# Patient Record
Sex: Female | Born: 1935
Health system: Southern US, Community
[De-identification: ages and names within clinical notes are randomized; demographics above are authoritative.]

## PROBLEM LIST (undated history)

## (undated) DIAGNOSIS — E785 Hyperlipidemia, unspecified: Secondary | ICD-10-CM

## (undated) DIAGNOSIS — I2510 Atherosclerotic heart disease of native coronary artery without angina pectoris: Secondary | ICD-10-CM

## (undated) DIAGNOSIS — M1990 Unspecified osteoarthritis, unspecified site: Secondary | ICD-10-CM

## (undated) DIAGNOSIS — I10 Essential (primary) hypertension: Secondary | ICD-10-CM

## (undated) DIAGNOSIS — I4891 Unspecified atrial fibrillation: Secondary | ICD-10-CM

## (undated) DIAGNOSIS — R011 Cardiac murmur, unspecified: Secondary | ICD-10-CM

## (undated) DIAGNOSIS — M810 Age-related osteoporosis without current pathological fracture: Secondary | ICD-10-CM

## (undated) DIAGNOSIS — H3530 Unspecified macular degeneration: Secondary | ICD-10-CM

## (undated) DIAGNOSIS — I639 Cerebral infarction, unspecified: Secondary | ICD-10-CM

## (undated) HISTORY — DX: Unspecified osteoarthritis, unspecified site: M19.90

## (undated) HISTORY — DX: Age-related osteoporosis without current pathological fracture: M81.0

## (undated) HISTORY — DX: Essential (primary) hypertension: I10

## (undated) HISTORY — DX: Hyperlipidemia, unspecified: E78.5

## (undated) HISTORY — DX: Unspecified macular degeneration: H35.30

## (undated) HISTORY — DX: Cardiac murmur, unspecified: R01.1

## (undated) HISTORY — PX: ABDOMINAL HYSTERECTOMY: SHX81

## (undated) HISTORY — PX: LEEP: SHX91

## (undated) HISTORY — PX: KNEE ARTHROSCOPY: SUR90

## (undated) HISTORY — PX: BREAST BIOPSY: SHX20

---

## 1998-11-20 ENCOUNTER — Encounter: Payer: Self-pay | Admitting: Gastroenterology

## 1998-11-20 ENCOUNTER — Ambulatory Visit (HOSPITAL_COMMUNITY): Admission: RE | Admit: 1998-11-20 | Discharge: 1998-11-20 | Payer: Self-pay | Admitting: Gastroenterology

## 1999-08-21 ENCOUNTER — Encounter: Payer: Self-pay | Admitting: Family Medicine

## 1999-08-22 ENCOUNTER — Encounter: Payer: Self-pay | Admitting: Family Medicine

## 1999-08-27 ENCOUNTER — Encounter: Payer: Self-pay | Admitting: Family Medicine

## 2003-07-25 LAB — CONVERTED CEMR LAB: Pap Smear: NORMAL

## 2004-06-10 ENCOUNTER — Ambulatory Visit: Payer: Self-pay | Admitting: Family Medicine

## 2004-07-16 ENCOUNTER — Ambulatory Visit: Payer: Self-pay | Admitting: Gastroenterology

## 2004-08-28 ENCOUNTER — Ambulatory Visit: Payer: Self-pay | Admitting: *Deleted

## 2004-09-23 ENCOUNTER — Ambulatory Visit: Payer: Self-pay

## 2005-05-12 ENCOUNTER — Ambulatory Visit: Payer: Self-pay | Admitting: *Deleted

## 2005-12-30 ENCOUNTER — Ambulatory Visit: Payer: Self-pay | Admitting: *Deleted

## 2009-11-13 ENCOUNTER — Encounter: Payer: Self-pay | Admitting: Family Medicine

## 2010-03-19 ENCOUNTER — Encounter: Payer: Self-pay | Admitting: Family Medicine

## 2010-07-30 ENCOUNTER — Other Ambulatory Visit: Payer: Self-pay | Admitting: Family Medicine

## 2010-07-30 ENCOUNTER — Ambulatory Visit
Admission: RE | Admit: 2010-07-30 | Discharge: 2010-07-30 | Payer: Self-pay | Source: Home / Self Care | Attending: Family Medicine | Admitting: Family Medicine

## 2010-07-30 ENCOUNTER — Encounter: Payer: Self-pay | Admitting: Family Medicine

## 2010-07-30 DIAGNOSIS — M949 Disorder of cartilage, unspecified: Secondary | ICD-10-CM

## 2010-07-30 DIAGNOSIS — M25559 Pain in unspecified hip: Secondary | ICD-10-CM | POA: Insufficient documentation

## 2010-07-30 DIAGNOSIS — I10 Essential (primary) hypertension: Secondary | ICD-10-CM | POA: Insufficient documentation

## 2010-07-30 DIAGNOSIS — M899 Disorder of bone, unspecified: Secondary | ICD-10-CM | POA: Insufficient documentation

## 2010-07-30 DIAGNOSIS — M1389 Other specified arthritis, multiple sites: Secondary | ICD-10-CM | POA: Insufficient documentation

## 2010-07-30 DIAGNOSIS — E785 Hyperlipidemia, unspecified: Secondary | ICD-10-CM | POA: Insufficient documentation

## 2010-07-30 LAB — HEPATIC FUNCTION PANEL
ALT: 21 U/L (ref 0–35)
AST: 26 U/L (ref 0–37)
Albumin: 3.8 g/dL (ref 3.5–5.2)
Alkaline Phosphatase: 66 U/L (ref 39–117)
Bilirubin, Direct: 0.2 mg/dL (ref 0.0–0.3)
Total Bilirubin: 1 mg/dL (ref 0.3–1.2)
Total Protein: 6.7 g/dL (ref 6.0–8.3)

## 2010-07-30 LAB — BASIC METABOLIC PANEL
BUN: 30 mg/dL — ABNORMAL HIGH (ref 6–23)
CO2: 26 mEq/L (ref 19–32)
Calcium: 9.7 mg/dL (ref 8.4–10.5)
Chloride: 107 mEq/L (ref 96–112)
Creatinine, Ser: 1.4 mg/dL — ABNORMAL HIGH (ref 0.4–1.2)
GFR: 39.8 mL/min — ABNORMAL LOW (ref >60.00)
Glucose, Bld: 98 mg/dL (ref 70–99)
Potassium: 4.1 mEq/L (ref 3.5–5.1)
Sodium: 142 mEq/L (ref 135–145)

## 2010-07-31 ENCOUNTER — Telehealth (INDEPENDENT_AMBULATORY_CARE_PROVIDER_SITE_OTHER): Payer: Self-pay | Admitting: *Deleted

## 2010-08-01 ENCOUNTER — Encounter: Payer: Self-pay | Admitting: Family Medicine

## 2010-08-04 ENCOUNTER — Other Ambulatory Visit: Payer: Self-pay | Admitting: Family Medicine

## 2010-08-04 ENCOUNTER — Ambulatory Visit
Admission: RE | Admit: 2010-08-04 | Discharge: 2010-08-04 | Payer: Self-pay | Source: Home / Self Care | Attending: Family Medicine | Admitting: Family Medicine

## 2010-08-04 LAB — BASIC METABOLIC PANEL
BUN: 25 mg/dL — ABNORMAL HIGH (ref 6–23)
CO2: 29 mEq/L (ref 19–32)
Calcium: 9.9 mg/dL (ref 8.4–10.5)
Chloride: 104 mEq/L (ref 96–112)
Creatinine, Ser: 1.1 mg/dL (ref 0.4–1.2)
GFR: 49.2 mL/min — ABNORMAL LOW (ref >60.00)
Glucose, Bld: 98 mg/dL (ref 70–99)
Potassium: 4.1 mEq/L (ref 3.5–5.1)
Sodium: 141 mEq/L (ref 135–145)

## 2010-08-04 LAB — HEPATIC FUNCTION PANEL
ALT: 19 U/L (ref 0–35)
AST: 26 U/L (ref 0–37)
Albumin: 4 g/dL (ref 3.5–5.2)
Alkaline Phosphatase: 64 U/L (ref 39–117)
Bilirubin, Direct: 0.1 mg/dL (ref 0.0–0.3)
Total Bilirubin: 1.1 mg/dL (ref 0.3–1.2)
Total Protein: 7 g/dL (ref 6.0–8.3)

## 2010-08-04 LAB — LIPID PANEL
Cholesterol: 183 mg/dL (ref 0–200)
HDL: 65.4 mg/dL (ref >39.00)
LDL Cholesterol: 100 mg/dL — ABNORMAL HIGH (ref 0–99)
Total CHOL/HDL Ratio: 3
Triglycerides: 88 mg/dL (ref 0.0–149.0)
VLDL: 17.6 mg/dL (ref 0.0–40.0)

## 2010-08-06 ENCOUNTER — Telehealth: Payer: Self-pay | Admitting: Family Medicine

## 2010-08-14 NOTE — Letter (Signed)
Summary: Letter to Patient Regarding Lab Results/[facility]  Letter to Patient Regarding Lab Results/[facility]   Imported By: Lanelle Bal 08/08/2010 09:13:31  _____________________________________________________________________  External Attachment:    Type:   Image     Comment:   External Document

## 2010-08-14 NOTE — Progress Notes (Signed)
Summary: Results 1/19, 1/20  Phone Note Outgoing Call   Call placed by: Almeta Monas CMA Duncan Dull),  July 31, 2010 4:54 PM Details for Reason: kidney function slightly elevated-----may be dehydrated----increase water intake recheck 2 weeks   401.9  bmp Summary of Call: spoke with patient and I advised of the above and she stated she drank alot of water, stated she basically only drink water. She said she doesn't drink alcohol or take tylenol, only a baby asa daily. Would you like for patient to have her labs repeated sooner??? Initial call taken by: Almeta Monas CMA Duncan Dull),  July 31, 2010 4:57 PM  Follow-up for Phone Call        yes-- next week is fine Follow-up by: Loreen Freud DO,  July 31, 2010 5:01 PM  Additional Follow-up for Phone Call Additional follow up Details #1::        Left message to call back... Almeta Monas CMA Duncan Dull)  August 01, 2010 10:20 AM     Additional Follow-up for Phone Call Additional follow up Details #2::    she was in today--- make sure regina did a hep too. Follow-up by: Loreen Freud DO,  August 04, 2010 10:20 AM  Additional Follow-up for Phone Call Additional follow up Details #3:: Details for Additional Follow-up Action Taken: I made [facility] aware to add a hep.... Almeta Monas CMA Duncan Dull)  August 04, 2010 10:52 AM

## 2010-08-14 NOTE — Assessment & Plan Note (Signed)
Summary: NEW TO EST/KN   Vital Signs:  Patient profile:   77 year old female Height:      63 inches Weight:      192.4 pounds BMI:     34.21 Pulse rate:   80 / minute Pulse rhythm:   regular BP sitting:   130 / 76  (right arm) Cuff size:   large  Vitals Entered By: Almeta Monas CMA Duncan Dull) (July 30, 2010 10:09 AM) CC: New est care-- c/o arthritis and needs bone density   History of Present Illness: Pt is here to establish.  Pt sees cardiologist ---in [facility].  He takes care of her cholesterol and bp med. Pt complains of b/l hip pain that has progressively getting worse.   The mobic 7.5 is not working like it used to.     Preventive Screening-Counseling & Management  Alcohol-Tobacco     Alcohol drinks/day: <1     Smoking Status: never  Caffeine-Diet-Exercise     Caffeine use/day: 0     Does Patient Exercise: no      Drug Use:  no.    Current Medications (verified): 1)  Hydrochlorothiazide 25 Mg Tabs (Hydrochlorothiazide) .Marland Kitchen.. 1 By Mouth Once Daily 2)  Pravachol 40 Mg Tabs (Pravastatin Sodium) .Marland Kitchen.. 1 By Mouth At Bedtime 3)  Meloxicam 15 Mg Tabs (Meloxicam) .Marland Kitchen.. 1 By Mouth Once Daily 4)  Sotalol Hcl 80 Mg Tabs (Sotalol Hcl) .Marland Kitchen.. 1 By Mouth Two Times A Day 5)  Calcium 600 1500 Mg Tabs (Calcium Carbonate) .Marland Kitchen.. 1 By Mouth Two Times A Day 6)  Senior Multivitamin Plus  Tabs (Multiple Vitamins-Minerals) .Marland Kitchen.. 1 By Mouth Once Daily  Allergies (verified): No Known Drug Allergies  Past History:  Family History: Last updated: 07/30/2010 Family History of Arthritis Family History of Cardiovascular disorder M --parkinsons ,  Rheumatoid arth Family History Diabetes 1st degree relative  Social History: Last updated: 07/30/2010 Retired-- Chief Operating Officer  Divorced Never Smoked Alcohol use-yes Drug use-no Regular exercise-no  Risk Factors: Alcohol Use: <1 (07/30/2010) Caffeine Use: 0 (07/30/2010) Exercise: no (07/30/2010)  Risk Factors: Smoking Status: never  (07/30/2010)  Past Medical History: Hyperlipidemia Hypertension Arthritis  Past Surgical History: Hysterectomy Breast Biopsy  Family History: Reviewed history and no changes required. Family History of Arthritis Family History of Cardiovascular disorder M --parkinsons ,  Rheumatoid arth Family History Diabetes 1st degree relative  Social History: Reviewed history and no changes required. Retired-- Chief Operating Officer  Divorced Never Smoked Alcohol use-yes Drug use-no Regular exercise-no Smoking Status:  never Caffeine use/day:  0 Does Patient Exercise:  no Drug Use:  no  Review of Systems      See HPI  Physical Exam  General:  Well-developed,well-nourished,in no acute distress; alert,appropriate and cooperative throughout examination Neck:  No deformities, masses, or tenderness noted. Lungs:  Normal respiratory effort, chest expands symmetrically. Lungs are clear to auscultation, no crackles or wheezes. Heart:  normal rate and no murmur.   Msk:  normal ROM, no joint swelling, no joint warmth, and no redness over joints.   + b/l hip pain with working  Extremities:  No clubbing, cyanosis, edema, or deformity noted with normal full range of motion of all joints.   Neurologic:  gait normal and DTRs symmetrical and normal.   Psych:  Oriented X3 and normally interactive.     Impression & Recommendations:  Problem # 1:  HIP PAIN, BILATERAL (ICD-719.45)  Her updated medication list for this problem includes:    Meloxicam 15 Mg Tabs (  Meloxicam) .Marland Kitchen... 1 by mouth once daily  Discussed use of medications, application of heat or cold, and exercises.   Orders: Prescription Created Electronically [phone])  Problem # 2:  OSTEOPENIA (ICD-733.90)  Her updated medication list for this problem includes:    Calcium 600 1500 Mg Tabs (Calcium carbonate) .Marland Kitchen... 1 by mouth two times a day  Orders: Radiology Referral (Radiology)  Problem # 3:  HYPERLIPIDEMIA (ICD-272.4)  Her  updated medication list for this problem includes:    Pravachol 40 Mg Tabs (Pravastatin sodium) .Marland Kitchen... 1 by mouth at bedtime  Orders: Venipuncture (60454) TLB-BMP (Basic Metabolic Panel-BMET) (80048-METABOL) TLB-Hepatic/Liver Function Pnl (80076-HEPATIC) Specimen Handling (09811)  Problem # 4:  HYPERTENSION (ICD-401.9)  Her updated medication list for this problem includes:    Hydrochlorothiazide 25 Mg Tabs (Hydrochlorothiazide) .Marland Kitchen... 1 by mouth once daily    Sotalol Hcl 80 Mg Tabs (Sotalol hcl) .Marland Kitchen... 1 by mouth two times a day  Orders: Venipuncture (91478) TLB-BMP (Basic Metabolic Panel-BMET) (80048-METABOL) TLB-Hepatic/Liver Function Pnl (80076-HEPATIC) Specimen Handling (29562)  BP today: 130/76  Complete Medication List: 1)  Hydrochlorothiazide 25 Mg Tabs (Hydrochlorothiazide) .Marland Kitchen.. 1 by mouth once daily 2)  Pravachol 40 Mg Tabs (Pravastatin sodium) .Marland Kitchen.. 1 by mouth at bedtime 3)  Meloxicam 15 Mg Tabs (Meloxicam) .Marland Kitchen.. 1 by mouth once daily 4)  Sotalol Hcl 80 Mg Tabs (Sotalol hcl) .Marland Kitchen.. 1 by mouth two times a day 5)  Calcium 600 1500 Mg Tabs (Calcium carbonate) .Marland Kitchen.. 1 by mouth two times a day 6)  Senior Multivitamin Plus Tabs (Multiple vitamins-minerals) .Marland Kitchen.. 1 by mouth once daily  Other Orders: Pneumococcal Vaccine (13086) Admin 1st Vaccine (57846) Prescriptions: SOTALOL HCL 80 MG TABS (SOTALOL HCL) 1 by mouth two times a day  #60 x 0   Entered and Authorized by:   Loreen Freud DO   Signed by:   Floydene Flock on 07/30/2010   Method used:   Historical   RxID:   [id number] MELOXICAM 15 MG TABS (MELOXICAM) 1 by mouth once daily  #90 x 3   Entered and Authorized by:   Loreen Freud DO   Signed by:   Loreen Freud DO on 07/30/2010   Method used:   Electronically to        [facility] [address] (retail)       [phone]       [place], [place]  [address]       Ph: [phone]       Fax: [phone]   RxID:    [id number]    Orders Added: 1)  Radiology Referral [Radiology] 2)  Venipuncture [09323] 3)  TLB-BMP (Basic Metabolic Panel-BMET) [80048-METABOL] 4)  TLB-Hepatic/Liver Function Pnl [80076-HEPATIC] 5)  Specimen Handling [99000] 6)  Pneumococcal Vaccine [90732] 7)  Admin 1st Vaccine [90471] 8)  New Patient Level III [55732] 9)  Prescription Created Electronically [phone]   Immunizations Administered:  Pneumonia Vaccine:    Vaccine Type: Pneumovax (Medicare)    Site: left deltoid    Mfr: Merck    Dose: 0.5 ml    Route: IM    Given by: Almeta Monas CMA (AAMA)    Exp. Date: 11/12/2011    Lot #: [id number]    VIS given: 06/17/09 version given July 30, 2010.   Immunizations Administered:  Pneumonia Vaccine:    Vaccine Type: Pneumovax (Medicare)    Site: left deltoid    Mfr: Merck    Dose: 0.5 ml  Route: IM    Given by: Almeta Monas CMA (AAMA)    Exp. Date: 11/12/2011    Lot #: [id number]    VIS given: 06/17/09 version given July 30, 2010.  Flu Vaccine Next Due:  Refused Colonoscopy Next Due:  Refused PAP Result Date:  07/25/2003 PAP Result:  normal-- vaginal smear PAP Next Due:  Not Indicated Mammogram Next Due:  Refused

## 2010-08-14 NOTE — Progress Notes (Signed)
Summary: Results 1/25  Phone Note Outgoing Call   Call placed by: Almeta Monas CMA Duncan Dull),  August 06, 2010 2:49 PM Call placed to: Patient Details for Reason: BuN elevated--- pt needs to make sure she is drinking enough fluids recheck 2 weeks--------401.9  bmp rest of labs 6 months---lipid, hep 272.4 Summary of Call: spoke with patient and she stated she does not like these results, thinks it may be caused by the HCTZ. She really wants to get down to the bottom of this wants  to know what to do...Marland KitchenMarland Kitchen please advise   Follow-up for Phone Call        Yes it could be but it also can be dehydration--- that is why I want to make sure she is drinking enough water specifically.  If she is then we can cut HCTZ in half--- but we would need to see her in 2 weeks to recheck labs and check bp Follow-up by: Loreen Freud DO,  August 06, 2010 3:04 PM  Additional Follow-up for Phone Call Additional follow up Details #1::        pt aware of the above and has been advised to monitor her BP and swelling and call if any problems, patient agreed. Additional Follow-up by: Almeta Monas CMA Duncan Dull),  August 07, 2010 4:20 PM

## 2010-08-14 NOTE — Letter (Signed)
Summary: Letter to Patient Regarding Results of Elbow Xray/[facility]  Letter to Patient Regarding Results of Elbow Xray/[facility]   Imported By: Lanelle Bal 08/08/2010 09:12:40  _____________________________________________________________________  External Attachment:    Type:   Image     Comment:   External Document

## 2010-08-18 ENCOUNTER — Encounter: Payer: Self-pay | Admitting: Family Medicine

## 2010-08-18 ENCOUNTER — Other Ambulatory Visit: Payer: Self-pay | Admitting: Family Medicine

## 2010-08-18 ENCOUNTER — Ambulatory Visit (INDEPENDENT_AMBULATORY_CARE_PROVIDER_SITE_OTHER): Payer: Medicare Other | Admitting: Family Medicine

## 2010-08-18 DIAGNOSIS — M899 Disorder of bone, unspecified: Secondary | ICD-10-CM

## 2010-08-18 DIAGNOSIS — M949 Disorder of cartilage, unspecified: Secondary | ICD-10-CM

## 2010-08-18 DIAGNOSIS — E785 Hyperlipidemia, unspecified: Secondary | ICD-10-CM

## 2010-08-18 DIAGNOSIS — M25559 Pain in unspecified hip: Secondary | ICD-10-CM

## 2010-08-18 DIAGNOSIS — I10 Essential (primary) hypertension: Secondary | ICD-10-CM

## 2010-08-18 LAB — CBC WITH DIFFERENTIAL/PLATELET
Basophils Absolute: 0 10*3/uL (ref 0.0–0.1)
Basophils Relative: 0.6 % (ref 0.0–3.0)
Eosinophils Absolute: 0.5 10*3/uL (ref 0.0–0.7)
Eosinophils Relative: 7.1 % — ABNORMAL HIGH (ref 0.0–5.0)
HCT: 39.8 % (ref 36.0–46.0)
Hemoglobin: 13.7 g/dL (ref 12.0–15.0)
Lymphocytes Relative: 35.1 % (ref 12.0–46.0)
Lymphs Abs: 2.2 10*3/uL (ref 0.7–4.0)
MCHC: 34.3 g/dL (ref 30.0–36.0)
MCV: 94.9 fl (ref 78.0–100.0)
Monocytes Absolute: 0.5 10*3/uL (ref 0.1–1.0)
Monocytes Relative: 8 % (ref 3.0–12.0)
Neutro Abs: 3.1 10*3/uL (ref 1.4–7.7)
Neutrophils Relative %: 49.2 % (ref 43.0–77.0)
Platelets: 181 10*3/uL (ref 150.0–400.0)
RBC: 4.2 Mil/uL (ref 3.87–5.11)
RDW: 13.9 % (ref 11.5–14.6)
WBC: 6.4 10*3/uL (ref 4.5–10.5)

## 2010-08-18 LAB — LIPID PANEL
Cholesterol: 176 mg/dL (ref 0–200)
HDL: 61.7 mg/dL (ref >39.00)
LDL Cholesterol: 97 mg/dL (ref 0–99)
Total CHOL/HDL Ratio: 3
Triglycerides: 86 mg/dL (ref 0.0–149.0)
VLDL: 17.2 mg/dL (ref 0.0–40.0)

## 2010-08-18 LAB — HEPATIC FUNCTION PANEL
ALT: 24 U/L (ref 0–35)
AST: 25 U/L (ref 0–37)
Albumin: 3.9 g/dL (ref 3.5–5.2)
Alkaline Phosphatase: 64 U/L (ref 39–117)
Bilirubin, Direct: 0.1 mg/dL (ref 0.0–0.3)
Total Bilirubin: 0.6 mg/dL (ref 0.3–1.2)
Total Protein: 6.8 g/dL (ref 6.0–8.3)

## 2010-08-18 LAB — BASIC METABOLIC PANEL
BUN: 31 mg/dL — ABNORMAL HIGH (ref 6–23)
CO2: 29 mEq/L (ref 19–32)
Calcium: 9.6 mg/dL (ref 8.4–10.5)
Chloride: 107 mEq/L (ref 96–112)
Creatinine, Ser: 1.3 mg/dL — ABNORMAL HIGH (ref 0.4–1.2)
GFR: 43.04 mL/min — ABNORMAL LOW (ref >60.00)
Glucose, Bld: 111 mg/dL — ABNORMAL HIGH (ref 70–99)
Potassium: 3.9 mEq/L (ref 3.5–5.1)
Sodium: 144 mEq/L (ref 135–145)

## 2010-08-20 LAB — CONVERTED CEMR LAB: Vit D, 25-Hydroxy: 41 ng/mL (ref 30–89)

## 2010-08-20 NOTE — Letter (Signed)
Summary: [facility]   Imported By: Lanelle Bal 08/11/2010 11:08:44  _____________________________________________________________________  External Attachment:    Type:   Image     Comment:   External Document

## 2010-08-20 NOTE — Letter (Signed)
Summary: [facility]   Imported By: Lanelle Bal 08/11/2010 11:07:47  _____________________________________________________________________  External Attachment:    Type:   Image     Comment:   External Document

## 2010-08-20 NOTE — Progress Notes (Signed)
Summary: Med List Brought by Patient  Med List Brought by Patient   Imported By: Lanelle Bal 08/12/2010 13:59:47  _____________________________________________________________________  External Attachment:    Type:   Image     Comment:   External Document

## 2010-08-28 NOTE — Assessment & Plan Note (Signed)
Summary: followup per pt/lab/kn  Nurse Visit   Vital Signs:  Patient profile:   77 year old female Weight:      192 pounds Pulse rate:   80 / minute Pulse rhythm:   regular BP sitting:   120 / 76  (left arm) Cuff size:   large  Vitals Entered By: Almeta Monas CMA Duncan Dull) (August 18, 2010 9:11 AM) CC: follow up---Fasting   Problems Prior to Update: 1)  Hip Pain, Bilateral  (ICD-719.45) 2)  Allergic Arthritis Other Specified Sites  (ICD-716.28) 3)  Family History Diabetes 1st Degree Relative  (ICD-V18.0) 4)  Osteopenia  (ICD-733.90) 5)  Hypertension  (ICD-401.9) 6)  Hyperlipidemia  (ICD-272.4)  Medications Prior to Update: 1)  Hydrochlorothiazide 25 Mg Tabs (Hydrochlorothiazide) .Marland Kitchen.. 1 By Mouth Once Daily 2)  Pravachol 40 Mg Tabs (Pravastatin Sodium) .Marland Kitchen.. 1 By Mouth At Bedtime 3)  Meloxicam 15 Mg Tabs (Meloxicam) .Marland Kitchen.. 1 By Mouth Once Daily 4)  Sotalol Hcl 80 Mg Tabs (Sotalol Hcl) .Marland Kitchen.. 1 By Mouth Two Times A Day 5)  Calcium 600 1500 Mg Tabs (Calcium Carbonate) .Marland Kitchen.. 1 By Mouth Two Times A Day 6)  Senior Multivitamin Plus  Tabs (Multiple Vitamins-Minerals) .Marland Kitchen.. 1 By Mouth Once Daily 7)  Calcium 600-200 Mg-Unit Tabs (Calcium-Vitamin D) .... 2 By Mouth Once Daily 8)  Aspirin 81 Mg Tbec (Aspirin) .... By Mouth Once Daily 9)  Omega 3 -100 Mg .... 2 By Mouth Once Daily 10)  Mvi .... By Mouth Once Daily  Current Medications (verified): 1)  Hydrochlorothiazide 25 Mg Tabs (Hydrochlorothiazide) .Marland Kitchen.. 1 By Mouth Once Daily 2)  Pravachol 40 Mg Tabs (Pravastatin Sodium) .Marland Kitchen.. 1 By Mouth At Bedtime 3)  Meloxicam 15 Mg Tabs (Meloxicam) .Marland Kitchen.. 1 By Mouth Once Daily 4)  Sotalol Hcl 80 Mg Tabs (Sotalol Hcl) .Marland Kitchen.. 1 By Mouth Two Times A Day 5)  Calcium 600 1500 Mg Tabs (Calcium Carbonate) .Marland Kitchen.. 1 By Mouth Two Times A Day 6)  Senior Multivitamin Plus  Tabs (Multiple Vitamins-Minerals) .Marland Kitchen.. 1 By Mouth Once Daily 7)  Calcium 600-200 Mg-Unit Tabs (Calcium-Vitamin D) .... 2 By Mouth Once Daily 8)   Aspirin 81 Mg Tbec (Aspirin) .... By Mouth Once Daily 9)  Omega 3 -100 Mg .... 2 By Mouth Once Daily 10)  Mvi .... By Mouth Once Daily  Allergies (verified): No Known Drug Allergies  Impression & Recommendations:  Problem # 1:  HIP PAIN, BILATERAL (ICD-719.45)  Her updated medication list for this problem includes:    Meloxicam 15 Mg Tabs (Meloxicam) .Marland Kitchen... 1 by mouth once daily    Aspirin 81 Mg Tbec (Aspirin) ..... By mouth once daily  Orders: Orthopedic Surgeon Referral (Ortho Surgeon)  Discussed use of medications, application of heat or cold, and exercises.   Problem # 2:  HYPERTENSION (ICD-401.9)  Her updated medication list for this problem includes:    Hydrochlorothiazide 25 Mg Tabs (Hydrochlorothiazide) .Marland Kitchen... 1 by mouth once daily    Sotalol Hcl 80 Mg Tabs (Sotalol hcl) .Marland Kitchen... 1 by mouth two times a day  Orders: Venipuncture (69629) TLB-Lipid Panel (80061-LIPID) TLB-BMP (Basic Metabolic Panel-BMET) (80048-METABOL) TLB-CBC Platelet - w/Differential (85025-CBCD) TLB-Hepatic/Liver Function Pnl (80076-HEPATIC) T-Vitamin D (25-Hydroxy) (52841-32440) T- * Misc. Laboratory test [phone]) Venipuncture [phone]) TLB-BMP (Basic Metabolic Panel-BMET) (80048-METABOL)  Complete Medication List: 1)  Hydrochlorothiazide 25 Mg Tabs (Hydrochlorothiazide) .Marland Kitchen.. 1 by mouth once daily 2)  Pravachol 40 Mg Tabs (Pravastatin sodium) .Marland Kitchen.. 1 by mouth at bedtime 3)  Meloxicam 15 Mg Tabs (Meloxicam) .Marland KitchenMarland KitchenMarland Kitchen  1 by mouth once daily 4)  Sotalol Hcl 80 Mg Tabs (Sotalol hcl) .Marland Kitchen.. 1 by mouth two times a day 5)  Calcium 600 1500 Mg Tabs (Calcium carbonate) .Marland Kitchen.. 1 by mouth two times a day 6)  Senior Multivitamin Plus Tabs (Multiple vitamins-minerals) .Marland Kitchen.. 1 by mouth once daily 7)  Calcium 600-200 Mg-unit Tabs (Calcium-vitamin d) .... 2 by mouth once daily 8)  Aspirin 81 Mg Tbec (Aspirin) .... By mouth once daily 9)  Omega 3 -100 Mg  .... 2 by mouth once daily 10)  Mvi  .... By mouth once daily   History  of Present Illness: Pt here for f/u bp and repeat bun/cr.  Pt states she is drinking a lot of fluids so she does not understand why her bun / cr are elevated..  Pt also requesting shingles vaccine but doesn't think she ever had chicken pox.  Pt also c/o increased hip pain--- no new injury.  Pain has been going on for years.  Mobic used to help.   Orders Added: 1)  Venipuncture [36415] 2)  TLB-Lipid Panel [80061-LIPID] 3)  TLB-BMP (Basic Metabolic Panel-BMET) [80048-METABOL] 4)  TLB-CBC Platelet - w/Differential [85025-CBCD] 5)  TLB-Hepatic/Liver Function Pnl [80076-HEPATIC] 6)  T-Vitamin D (25-Hydroxy) [16109-60454] 7)  T- * Misc. Laboratory test [99999] 8)  Venipuncture [36415] 9)  TLB-BMP (Basic Metabolic Panel-BMET) [80048-METABOL] 10)  Orthopedic Surgeon Referral [Ortho Surgeon] 11)  Est. Patient Level III [99213]  Physical Exam  General:  Well-developed,well-nourished,in no acute distress; alert,appropriate and cooperative throughout examination Neck:  No deformities, masses, or tenderness noted. Lungs:  Normal respiratory effort, chest expands symmetrically. Lungs are clear to auscultation, no crackles or wheezes. Heart:  normal rate and Grade 2  /6 systolic ejection murmur.   Extremities:  No clubbing, cyanosis, edema, or deformity noted with normal full range of motion of all joints.   Psych:  Oriented X3 and normally interactive.

## 2010-09-01 ENCOUNTER — Other Ambulatory Visit: Payer: Self-pay | Admitting: Family Medicine

## 2010-09-01 ENCOUNTER — Encounter: Payer: Self-pay | Admitting: Family Medicine

## 2010-09-01 ENCOUNTER — Ambulatory Visit (INDEPENDENT_AMBULATORY_CARE_PROVIDER_SITE_OTHER): Payer: Medicare Other | Admitting: Family Medicine

## 2010-09-01 DIAGNOSIS — I10 Essential (primary) hypertension: Secondary | ICD-10-CM

## 2010-09-01 DIAGNOSIS — R944 Abnormal results of kidney function studies: Secondary | ICD-10-CM

## 2010-09-01 LAB — BASIC METABOLIC PANEL
BUN: 30 mg/dL — ABNORMAL HIGH (ref 6–23)
CO2: 28 mEq/L (ref 19–32)
Calcium: 9.3 mg/dL (ref 8.4–10.5)
Chloride: 109 mEq/L (ref 96–112)
Creatinine, Ser: 1.4 mg/dL — ABNORMAL HIGH (ref 0.4–1.2)
GFR: 40.47 mL/min — ABNORMAL LOW (ref >60.00)
Glucose, Bld: 107 mg/dL — ABNORMAL HIGH (ref 70–99)
Potassium: 4.7 mEq/L (ref 3.5–5.1)
Sodium: 142 mEq/L (ref 135–145)

## 2010-09-04 ENCOUNTER — Ambulatory Visit (INDEPENDENT_AMBULATORY_CARE_PROVIDER_SITE_OTHER): Payer: Medicare Other | Admitting: Family Medicine

## 2010-09-04 ENCOUNTER — Encounter: Payer: Self-pay | Admitting: Family Medicine

## 2010-09-04 DIAGNOSIS — M1990 Unspecified osteoarthritis, unspecified site: Secondary | ICD-10-CM | POA: Insufficient documentation

## 2010-09-09 NOTE — Assessment & Plan Note (Signed)
Summary: 2 WEEK RETURN/RH.....   Vital Signs:  Patient profile:   77 year old female Weight:      194.4 pounds Pulse rate:   80 / minute Pulse rhythm:   regular BP sitting:   126 / 80  (right arm) Cuff size:   large  Vitals Entered By: Almeta Monas CMA (AAMA) (September 01, 2010 9:00 AM) CC: F/u visit--pt ws taken off of HCTZ   History of Present Illness: Pt here to recheck bp off hctz.  No swelling.  No complaints.  Problems Prior to Update: 1)  Hip Pain, Bilateral  (ICD-719.45) 2)  Allergic Arthritis Other Specified Sites  (ICD-716.28) 3)  Family History Diabetes 1st Degree Relative  (ICD-V18.0) 4)  Osteopenia  (ICD-733.90) 5)  Hypertension  (ICD-401.9) 6)  Hyperlipidemia  (ICD-272.4)  Medications Prior to Update: 1)  Hydrochlorothiazide 25 Mg Tabs (Hydrochlorothiazide) .Marland Kitchen.. 1 By Mouth Once Daily 2)  Pravachol 40 Mg Tabs (Pravastatin Sodium) .Marland Kitchen.. 1 By Mouth At Bedtime 3)  Meloxicam 15 Mg Tabs (Meloxicam) .Marland Kitchen.. 1 By Mouth Once Daily 4)  Sotalol Hcl 80 Mg Tabs (Sotalol Hcl) .Marland Kitchen.. 1 By Mouth Two Times A Day 5)  Calcium 600 1500 Mg Tabs (Calcium Carbonate) .Marland Kitchen.. 1 By Mouth Two Times A Day 6)  Senior Multivitamin Plus  Tabs (Multiple Vitamins-Minerals) .Marland Kitchen.. 1 By Mouth Once Daily 7)  Calcium 600-200 Mg-Unit Tabs (Calcium-Vitamin D) .... 2 By Mouth Once Daily 8)  Aspirin 81 Mg Tbec (Aspirin) .... By Mouth Once Daily 9)  Omega 3 -100 Mg .... 2 By Mouth Once Daily 10)  Mvi .... By Mouth Once Daily 11)  Zostavax 16109 Unt/0.28ml Solr (Zoster Vaccine Live) .... Inject Sub-Q  Current Medications (verified): 1)  Pravachol 40 Mg Tabs (Pravastatin Sodium) .Marland Kitchen.. 1 By Mouth At Bedtime 2)  Meloxicam 15 Mg Tabs (Meloxicam) .Marland Kitchen.. 1 By Mouth Once Daily 3)  Sotalol Hcl 80 Mg Tabs (Sotalol Hcl) .Marland Kitchen.. 1 By Mouth Two Times A Day 4)  Calcium 600 1500 Mg Tabs (Calcium Carbonate) .Marland Kitchen.. 1 By Mouth Two Times A Day 5)  Senior Multivitamin Plus  Tabs (Multiple Vitamins-Minerals) .Marland Kitchen.. 1 By Mouth Once  Daily 6)  Calcium 600-200 Mg-Unit Tabs (Calcium-Vitamin D) .... 2 By Mouth Once Daily 7)  Aspirin 81 Mg Tbec (Aspirin) .... By Mouth Once Daily 8)  Omega 3 -100 Mg .... 2 By Mouth Once Daily 9)  Mvi .... By Mouth Once Daily 10)  Zostavax 60454 Unt/0.67ml Solr (Zoster Vaccine Live) .... Inject Sub-Q  Allergies (verified): No Known Drug Allergies  Past History:  Past Medical History: Last updated: 07/30/2010 Hyperlipidemia Hypertension Arthritis  Past Surgical History: Last updated: 07/30/2010 Hysterectomy Breast Biopsy  Family History: Last updated: 07/30/2010 Family History of Arthritis Family History of Cardiovascular disorder M --parkinsons ,  Rheumatoid arth Family History Diabetes 1st degree relative  Social History: Last updated: 07/30/2010 Retired-- [employer]  Divorced Never Smoked Alcohol use-yes Drug use-no Regular exercise-no  Risk Factors: Alcohol Use: <1 (07/30/2010) Caffeine Use: 0 (07/30/2010) Exercise: no (07/30/2010)  Risk Factors: Smoking Status: never (07/30/2010)  Family History: Reviewed history from 07/30/2010 and no changes required. Family History of Arthritis Family History of Cardiovascular disorder M --parkinsons ,  Rheumatoid arth Family History Diabetes 1st degree relative  Social History: Reviewed history from 07/30/2010 and no changes required. Retired-- Chief Operating Officer  Divorced Never Smoked Alcohol use-yes Drug use-no Regular exercise-no  Review of Systems      See HPI  Physical Exam  General:  USAA  no acute distress; alert,appropriate and cooperative throughout examination Neck:  No deformities, masses, or tenderness noted. Lungs:  Normal respiratory effort, chest expands symmetrically. Lungs are clear to auscultation, no crackles or wheezes. Heart:  normal rate and no murmur.   Extremities:  No clubbing, cyanosis, edema, or deformity noted with normal full range of motion  of all joints.   Psych:  Oriented X3 and normally interactive.     Impression & Recommendations:  Problem # 1:  HYPERTENSION (ICD-401.9)  The following medications were removed from the medication list:    Hydrochlorothiazide 25 Mg Tabs (Hydrochlorothiazide) .Marland Kitchen... 1 by mouth once daily Her updated medication list for this problem includes:    Sotalol Hcl 80 Mg Tabs (Sotalol hcl) .Marland Kitchen... 1 by mouth two times a day  Orders: Venipuncture (81191) TLB-BMP (Basic Metabolic Panel-BMET) (80048-METABOL)  BP today: 126/80 Prior BP: 120/76 (08/18/2010)  Labs Reviewed: K+: 3.9 (08/18/2010) Creat: : 1.3 (08/18/2010)   Chol: 176 (08/18/2010)   HDL: 61.70 (08/18/2010)   LDL: 97 (08/18/2010)   TG: 86.0 (08/18/2010)  Problem # 2:  NONSPECIFIC ABNORM RESULTS KIDNEY FUNCTION STUDY (ICD-794.4) check bmp if bun and cr still elevated----refer to nephrology  Complete Medication List: 1)  Pravachol 40 Mg Tabs (Pravastatin sodium) .Marland Kitchen.. 1 by mouth at bedtime 2)  Meloxicam 15 Mg Tabs (Meloxicam) .Marland Kitchen.. 1 by mouth once daily 3)  Sotalol Hcl 80 Mg Tabs (Sotalol hcl) .Marland Kitchen.. 1 by mouth two times a day 4)  Calcium 600 1500 Mg Tabs (Calcium carbonate) .Marland Kitchen.. 1 by mouth two times a day 5)  Senior Multivitamin Plus Tabs (Multiple vitamins-minerals) .Marland Kitchen.. 1 by mouth once daily 6)  Calcium 600-200 Mg-unit Tabs (Calcium-vitamin d) .... 2 by mouth once daily 7)  Aspirin 81 Mg Tbec (Aspirin) .... By mouth once daily 8)  Omega 3 -100 Mg  .... 2 by mouth once daily 9)  Mvi  .... By mouth once daily 10)  Zostavax 47829 Unt/0.39ml Solr (Zoster vaccine live) .... Inject sub-q   Orders Added: 1)  Venipuncture [36415] 2)  TLB-BMP (Basic Metabolic Panel-BMET) [80048-METABOL] 3)  Est. Patient Level III [56213]  Appended Document: 2 WEEK RETURN/RH...Marland KitchenMarland Kitchen

## 2010-09-09 NOTE — Assessment & Plan Note (Signed)
Summary: DISCUSS LAB RESULTS/RH....   Vital Signs:  Patient profile:   77 year old female Weight:      194.4 pounds Pulse rate:   80 / minute Pulse rhythm:   regular BP sitting:   128 / 80  (right arm) Cuff size:   large  Vitals Entered By: Almeta Monas CMA Duncan Dull) (September 04, 2010 1:53 PM) CC: review labs--has questions   History of Present Illness: pt here to discuss labs only.  No complaints except she is worried about arthritis pain she may get since we have to stop mobic.  Current Medications (verified): 1)  Pravachol 40 Mg Tabs (Pravastatin Sodium) .Marland Kitchen.. 1 By Mouth At Bedtime 2)  Sotalol Hcl 80 Mg Tabs (Sotalol Hcl) .Marland Kitchen.. 1 By Mouth Two Times A Day 3)  Calcium 600 1500 Mg Tabs (Calcium Carbonate) .Marland Kitchen.. 1 By Mouth Two Times A Day 4)  Senior Multivitamin Plus  Tabs (Multiple Vitamins-Minerals) .Marland Kitchen.. 1 By Mouth Once Daily 5)  Calcium 600-200 Mg-Unit Tabs (Calcium-Vitamin D) .... 2 By Mouth Once Daily 6)  Aspirin 81 Mg Tbec (Aspirin) .... By Mouth Once Daily 7)  Omega 3 -100 Mg .... 2 By Mouth Once Daily 8)  Mvi .... By Mouth Once Daily 9)  Zostavax 16109 Unt/0.54ml Solr (Zoster Vaccine Live) .... Inject Sub-Q 10)  Ultram 50 Mg Tabs (Tramadol Hcl) .Marland Kitchen.. 1 By Mouth Every 6 Hours As Needed  Allergies (verified): No Known Drug Allergies  Past History:  Past Medical History: Last updated: 07/30/2010 Hyperlipidemia Hypertension Arthritis  Past Surgical History: Last updated: 07/30/2010 Hysterectomy Breast Biopsy  Family History: Last updated: 07/30/2010 Family History of Arthritis Family History of Cardiovascular disorder M --parkinsons ,  Rheumatoid arth Family History Diabetes 1st degree relative  Social History: Last updated: 07/30/2010 Retired-- [employer]  Divorced Never Smoked Alcohol use-yes Drug use-no Regular exercise-no  Risk Factors: Alcohol Use: <1 (07/30/2010) Caffeine Use: 0 (07/30/2010) Exercise: no (07/30/2010)  Risk  Factors: Smoking Status: never (07/30/2010)  Family History: Reviewed history from 07/30/2010 and no changes required. Family History of Arthritis Family History of Cardiovascular disorder M --parkinsons ,  Rheumatoid arth Family History Diabetes 1st degree relative  Social History: Reviewed history from 07/30/2010 and no changes required. Retired-- Chief Operating Officer  Divorced Never Smoked Alcohol use-yes Drug use-no Regular exercise-no  Review of Systems      See HPI  Physical Exam  General:  Well-developed,well-nourished,in no acute distress; alert,appropriate and cooperative throughout examination Psych:  Oriented X3 and normally interactive.     Impression & Recommendations:  Problem # 1:  OSTEOARTHRITIS (ICD-715.90)  Her updated medication list for this problem includes:    Aspirin 81 Mg Tbec (Aspirin) ..... By mouth once daily    Ultram 50 Mg Tabs (Tramadol hcl) .Marland Kitchen... 1 by mouth every 6 hours as needed  Discussed use of medications, application of heat or cold, and exercises.   Orders: Prescription Created Electronically [phone])  Problem # 2:  NONSPECIFIC ABNORM RESULTS KIDNEY FUNCTION STUDY (ICD-794.4)  Complete Medication List: 1)  Pravachol 40 Mg Tabs (Pravastatin sodium) .Marland Kitchen.. 1 by mouth at bedtime 2)  Sotalol Hcl 80 Mg Tabs (Sotalol hcl) .Marland Kitchen.. 1 by mouth two times a day 3)  Calcium 600 1500 Mg Tabs (Calcium carbonate) .Marland Kitchen.. 1 by mouth two times a day 4)  Senior Multivitamin Plus Tabs (Multiple vitamins-minerals) .Marland Kitchen.. 1 by mouth once daily 5)  Calcium 600-200 Mg-unit Tabs (Calcium-vitamin d) .... 2 by mouth once daily 6)  Aspirin 81 Mg Tbec (  Aspirin) .... By mouth once daily 7)  Omega 3 -100 Mg  .... 2 by mouth once daily 8)  Mvi  .... By mouth once daily 9)  Zostavax 16109 Unt/0.35ml Solr (Zoster vaccine live) .... Inject sub-q 10)  Ultram 50 Mg Tabs (Tramadol hcl) .Marland Kitchen.. 1 by mouth every 6 hours as needed Prescriptions: ULTRAM 50 MG TABS (TRAMADOL HCL) 1  by mouth every 6 hours as needed  #30 x 2   Entered and Authorized by:   Loreen Freud DO   Signed by:   Loreen Freud DO on 09/04/2010   Method used:   Electronically to        [facility] [address] (retail)       [id number]       [place], [place]  [address]       Ph: [phone]       Fax: [phone]   RxID:   [id number]    Orders Added: 1)  Prescription Created Electronically [[id number]] 2)  Est. Patient Level III [02725]

## 2010-09-22 ENCOUNTER — Ambulatory Visit (INDEPENDENT_AMBULATORY_CARE_PROVIDER_SITE_OTHER): Payer: Medicare Other | Admitting: Family Medicine

## 2010-09-22 ENCOUNTER — Encounter: Payer: Self-pay | Admitting: Family Medicine

## 2010-09-22 ENCOUNTER — Other Ambulatory Visit: Payer: Self-pay | Admitting: Family Medicine

## 2010-09-22 DIAGNOSIS — M1990 Unspecified osteoarthritis, unspecified site: Secondary | ICD-10-CM

## 2010-09-22 DIAGNOSIS — R944 Abnormal results of kidney function studies: Secondary | ICD-10-CM

## 2010-09-22 LAB — BASIC METABOLIC PANEL
BUN: 28 mg/dL — ABNORMAL HIGH (ref 6–23)
CO2: 29 mEq/L (ref 19–32)
Calcium: 9.4 mg/dL (ref 8.4–10.5)
Chloride: 105 mEq/L (ref 96–112)
Creatinine, Ser: 1.1 mg/dL (ref 0.4–1.2)
GFR: 50.19 mL/min — ABNORMAL LOW (ref >60.00)
Glucose, Bld: 97 mg/dL (ref 70–99)
Potassium: 4.4 mEq/L (ref 3.5–5.1)
Sodium: 140 mEq/L (ref 135–145)

## 2010-09-23 ENCOUNTER — Ambulatory Visit: Payer: Medicare Other | Admitting: Family Medicine

## 2010-09-30 NOTE — Assessment & Plan Note (Signed)
Summary: Patient is off of meds per md and is not feeling well/nta   Vital Signs:  Patient profile:   77 year old female Weight:      194 pounds Pulse rate:   80 / minute Pulse rhythm:   regular BP sitting:   140 / 82  (right arm) Cuff size:   large  Vitals Entered By: Almeta Monas CMA Duncan Dull) (September 22, 2010 11:04 AM) CC: off meds x24month and feels bad   History of Present Illness: pt here secondary to f/u joint pains and kidney function.   Pt is in a lot of pain.   Pt states ultram is making her too tired and she feels bad when she takes it.  Pt has not heard from [facility] yet.  Pt is so upset she had to stop the Mobic for pain.  She has been on it for so long and it is the only thing that helps the pain and doesn't make her drowsy.  She is supposed to drive to [place] end of May to visit her autistic son and other family.    Problems Prior to Update: 1)  Osteoarthritis  (ICD-715.90) 2)  Nonspecific Abnorm Results Kidney Function Study  (ICD-794.4) 3)  Hip Pain, Bilateral  (ICD-719.45) 4)  Allergic Arthritis Other Specified Sites  (ICD-716.28) 5)  Family History Diabetes 1st Degree Relative  (ICD-V18.0) 6)  Osteopenia  (ICD-733.90) 7)  Hypertension  (ICD-401.9) 8)  Hyperlipidemia  (ICD-272.4)  Medications Prior to Update: 1)  Pravachol 40 Mg Tabs (Pravastatin Sodium) .Marland Kitchen.. 1 By Mouth At Bedtime 2)  Sotalol Hcl 80 Mg Tabs (Sotalol Hcl) .Marland Kitchen.. 1 By Mouth Two Times A Day 3)  Calcium 600 1500 Mg Tabs (Calcium Carbonate) .Marland Kitchen.. 1 By Mouth Two Times A Day 4)  Senior Multivitamin Plus  Tabs (Multiple Vitamins-Minerals) .Marland Kitchen.. 1 By Mouth Once Daily 5)  Calcium 600-200 Mg-Unit Tabs (Calcium-Vitamin D) .... 2 By Mouth Once Daily 6)  Aspirin 81 Mg Tbec (Aspirin) .... By Mouth Once Daily 7)  Omega 3 -100 Mg .... 2 By Mouth Once Daily 8)  Mvi .... By Mouth Once Daily 9)  Zostavax 32440 Unt/0.25ml Solr (Zoster Vaccine Live) .... Inject Sub-Q 10)  Ultram 50 Mg Tabs (Tramadol Hcl) .Marland Kitchen.. 1  By Mouth Every 6 Hours As Needed  Current Medications (verified): 1)  Sotalol Hcl 80 Mg Tabs (Sotalol Hcl) .Marland Kitchen.. 1 By Mouth Two Times A Day 2)  Calcium 600 1500 Mg Tabs (Calcium Carbonate) .Marland Kitchen.. 1 By Mouth Two Times A Day 3)  Senior Multivitamin Plus  Tabs (Multiple Vitamins-Minerals) .Marland Kitchen.. 1 By Mouth Once Daily 4)  Calcium 600-200 Mg-Unit Tabs (Calcium-Vitamin D) .... 2 By Mouth Once Daily 5)  Aspirin 81 Mg Tbec (Aspirin) .... By Mouth Once Daily 6)  Omega 3 -100 Mg .... 2 By Mouth Once Daily 7)  Mvi .... By Mouth Once Daily 8)  Zostavax 10272 Unt/0.57ml Solr (Zoster Vaccine Live) .... Inject Sub-Q 9)  Ultram 50 Mg Tabs (Tramadol Hcl) .Marland Kitchen.. 1 By Mouth Every 6 Hours As Needed  Allergies (verified): No Known Drug Allergies  Past History:  Past Medical History: Last updated: 07/30/2010 Hyperlipidemia Hypertension Arthritis  Past Surgical History: Last updated: 07/30/2010 Hysterectomy Breast Biopsy  Family History: Last updated: 07/30/2010 Family History of Arthritis Family History of Cardiovascular disorder M --parkinsons ,  Rheumatoid arth Family History Diabetes 1st degree relative  Social History: Last updated: 07/30/2010 Retired-- [employer]ings  Divorced Never Smoked Alcohol use-yes Drug use-no Regular exercise-no  Risk Factors: Alcohol Use: <1 (07/30/2010) Caffeine Use: 0 (07/30/2010) Exercise: no (07/30/2010)  Risk Factors: Smoking Status: never (07/30/2010)  Family History: Reviewed history from 07/30/2010 and no changes required. Family History of Arthritis Family History of Cardiovascular disorder M --parkinsons ,  Rheumatoid arth Family History Diabetes 1st degree relative  Social History: Reviewed history from 07/30/2010 and no changes required. Retired-- Chief Operating Officer  Divorced Never Smoked Alcohol use-yes Drug use-no Regular exercise-no  Review of Systems      See HPI  Physical Exam  General:  Well-developed,well-nourished,in  no acute distress; alert,appropriate and cooperative throughout examination Psych:  Oriented X3, normally interactive, good eye contact, not anxious appearing, and not depressed appearing.     Impression & Recommendations:  Problem # 1:  NONSPECIFIC ABNORM RESULTS KIDNEY FUNCTION STUDY (ICD-794.4) nephrology pending Orders: Venipuncture (04540) TLB-BMP (Basic Metabolic Panel-BMET) (80048-METABOL) Specimen Handling (98119)  Problem # 2:  OSTEOARTHRITIS (ICD-715.90) Assessment: Unchanged  Her updated medication list for this problem includes:    Aspirin 81 Mg Tbec (Aspirin) ..... By mouth once daily    Ultram 50 Mg Tabs (Tramadol hcl) .Marland Kitchen... 1 by mouth every 6 hours as needed----try taking 1/2 tramadol and tylenol call us if no relief  Discussed use of medications, application of heat or cold, and exercises.   Complete Medication List: 1)  Sotalol Hcl 80 Mg Tabs (Sotalol hcl) .Marland Kitchen.. 1 by mouth two times a day 2)  Calcium 600 1500 Mg Tabs (Calcium carbonate) .Marland Kitchen.. 1 by mouth two times a day 3)  Senior Multivitamin Plus Tabs (Multiple vitamins-minerals) .Marland Kitchen.. 1 by mouth once daily 4)  Calcium 600-200 Mg-unit Tabs (Calcium-vitamin d) .... 2 by mouth once daily 5)  Aspirin 81 Mg Tbec (Aspirin) .... By mouth once daily 6)  Omega 3 -100 Mg  .... 2 by mouth once daily 7)  Mvi  .... By mouth once daily 8)  Zostavax 14782 Unt/0.78ml Solr (Zoster vaccine live) .... Inject sub-q 9)  Ultram 50 Mg Tabs (Tramadol hcl) .Marland Kitchen.. 1 by mouth every 6 hours as needed  Patient Instructions: 1)  fasting labs---272.4  401.9  bmp, hep, lipid---- april    Orders Added: 1)  Venipuncture [95621] 2)  TLB-BMP (Basic Metabolic Panel-BMET) [80048-METABOL] 3)  Specimen Handling [99000] 4)  Est. Patient Level III [30865]

## 2010-10-11 ENCOUNTER — Encounter: Payer: Self-pay | Admitting: Family Medicine

## 2010-10-13 ENCOUNTER — Ambulatory Visit: Payer: Medicare Other | Admitting: Family Medicine

## 2010-10-14 ENCOUNTER — Telehealth: Payer: Self-pay | Admitting: *Deleted

## 2010-10-14 ENCOUNTER — Encounter: Payer: Self-pay | Admitting: Family Medicine

## 2010-10-14 ENCOUNTER — Ambulatory Visit (INDEPENDENT_AMBULATORY_CARE_PROVIDER_SITE_OTHER): Payer: Medicare Other | Admitting: Family Medicine

## 2010-10-14 VITALS — BP 122/76 | HR 60 | Wt 191.8 lb

## 2010-10-14 DIAGNOSIS — R944 Abnormal results of kidney function studies: Secondary | ICD-10-CM

## 2010-10-14 DIAGNOSIS — I10 Essential (primary) hypertension: Secondary | ICD-10-CM

## 2010-10-14 DIAGNOSIS — M1389 Other specified arthritis, multiple sites: Secondary | ICD-10-CM

## 2010-10-14 MED ORDER — MELOXICAM 7.5 MG PO TABS: 7.5000 mg | ORAL_TABLET | Freq: Every day | ORAL | Status: DC

## 2010-10-14 NOTE — Telephone Encounter (Signed)
PLEASE MAKE SURE IT IS CORRECT ON MED LIST

## 2010-10-14 NOTE — Patient Instructions (Signed)
rto 3 months

## 2010-10-14 NOTE — Assessment & Plan Note (Signed)
Pt not currently on medication

## 2010-10-14 NOTE — Assessment & Plan Note (Deleted)
Pt " would rather be dead than live without Mobic"

## 2010-10-14 NOTE — Telephone Encounter (Signed)
Med list updated

## 2010-10-14 NOTE — Assessment & Plan Note (Signed)
Nothing was wrong per pt--saw Nephrology Pt " would rather die than be in pain with arthritis"---she want Mobic back

## 2010-10-14 NOTE — Progress Notes (Signed)
**Note Helen-Identified via Obfuscation**   Subjective:    Patient ID: Helen Wilson, female    DOB: 1931/12/22, 77 y.o.   MRN: [id number]  HPI Pt here f/u from nephrology.  They told pt everything was normal.  Pt wants her Mobic back.   Pt would "rather die than be without her Mobic".  Pt states she can not function without it.      Review of Systems--as above    Objective:   Physical Exam  Constitutional: She is oriented to person, place, and time. She appears well-developed and well-nourished. No distress.  Cardiovascular:  Murmur heard.  Systolic murmur is present with a grade of 2/6  Pulmonary/Chest: Effort normal. No respiratory distress. She has no decreased breath sounds. She has no wheezes. She has no rhonchi. She has no rales.  Musculoskeletal: She exhibits tenderness.  Neurological: She is alert and oriented to person, place, and time.  Skin: She is not diaphoretic.  Psychiatric: Thought content normal.          Assessment & Plan:

## 2010-10-14 NOTE — Telephone Encounter (Signed)
Pt called to report that she is taking lisinopril 10 mg daily.

## 2010-11-06 ENCOUNTER — Encounter: Payer: Self-pay | Admitting: Family Medicine

## 2010-11-14 ENCOUNTER — Ambulatory Visit (INDEPENDENT_AMBULATORY_CARE_PROVIDER_SITE_OTHER): Payer: Medicare Other | Admitting: Family Medicine

## 2010-11-14 DIAGNOSIS — I10 Essential (primary) hypertension: Secondary | ICD-10-CM

## 2010-11-14 NOTE — Progress Notes (Signed)
  Subjective:    Patient ID: Helen Wilson, female    DOB: 1931-10-03, 77 y.o.   MRN: [id number]  HPI bp check only   Review of Systems     Objective:   Physical Exam        Assessment & Plan:

## 2011-01-15 ENCOUNTER — Encounter: Payer: Self-pay | Admitting: Family Medicine

## 2011-01-15 ENCOUNTER — Ambulatory Visit (INDEPENDENT_AMBULATORY_CARE_PROVIDER_SITE_OTHER): Payer: Medicare Other | Admitting: Family Medicine

## 2011-01-15 VITALS — BP 122/70 | HR 61 | Temp 98.5°F | Wt 190.6 lb

## 2011-01-15 DIAGNOSIS — R944 Abnormal results of kidney function studies: Secondary | ICD-10-CM

## 2011-01-15 DIAGNOSIS — L659 Nonscarring hair loss, unspecified: Secondary | ICD-10-CM

## 2011-01-15 DIAGNOSIS — I10 Essential (primary) hypertension: Secondary | ICD-10-CM

## 2011-01-15 DIAGNOSIS — E785 Hyperlipidemia, unspecified: Secondary | ICD-10-CM

## 2011-01-15 DIAGNOSIS — N189 Chronic kidney disease, unspecified: Secondary | ICD-10-CM

## 2011-01-15 LAB — BASIC METABOLIC PANEL
BUN: 29 mg/dL — ABNORMAL HIGH (ref 6–23)
CO2: 27 mEq/L (ref 19–32)
Calcium: 9.1 mg/dL (ref 8.4–10.5)
Chloride: 104 mEq/L (ref 96–112)
Creatinine, Ser: 1.4 mg/dL — ABNORMAL HIGH (ref 0.4–1.2)
GFR: 38.77 mL/min — ABNORMAL LOW (ref >60.00)
Glucose, Bld: 112 mg/dL — ABNORMAL HIGH (ref 70–99)
Potassium: 3.7 mEq/L (ref 3.5–5.1)
Sodium: 138 mEq/L (ref 135–145)

## 2011-01-15 LAB — HEPATIC FUNCTION PANEL
ALT: 22 U/L (ref 0–35)
AST: 24 U/L (ref 0–37)
Albumin: 4.2 g/dL (ref 3.5–5.2)
Alkaline Phosphatase: 66 U/L (ref 39–117)
Bilirubin, Direct: 0.1 mg/dL (ref 0.0–0.3)
Total Bilirubin: 0.9 mg/dL (ref 0.3–1.2)
Total Protein: 7.2 g/dL (ref 6.0–8.3)

## 2011-01-15 LAB — T4, FREE: Free T4: 1 ng/dL (ref 0.60–1.60)

## 2011-01-15 LAB — TSH: TSH: 2.07 u[IU]/mL (ref 0.35–5.50)

## 2011-01-15 LAB — T3, FREE: T3, Free: 2.7 pg/mL (ref 2.3–4.2)

## 2011-01-15 MED ORDER — LISINOPRIL 10 MG PO TABS: 10.0000 mg | ORAL_TABLET | Freq: Every day | ORAL | Status: DC

## 2011-01-15 MED ORDER — MELOXICAM 7.5 MG PO TABS: 7.5000 mg | ORAL_TABLET | Freq: Every day | ORAL | Status: DC

## 2011-01-15 NOTE — Assessment & Plan Note (Signed)
Check labs con't meds---changed to vytorin to cut down on number of pills

## 2011-01-15 NOTE — Assessment & Plan Note (Signed)
F/u nephro

## 2011-01-15 NOTE — Assessment & Plan Note (Signed)
con't meds  Check labs 

## 2011-01-15 NOTE — Patient Instructions (Signed)
Alopecia Areata, Autoimmune Hair Loss  Alopecia areata is a self-destructing (autoimmune) disease that results in the loss of hair. In this condition your body's immune system attacks the hair follicle. The hair follicle is responsible for growing hair. Hair loss can occur on the scalp and other parts of the body. It usually starts as one or more small, round, smooth patches of hair loss. It occurs in males and females of all ages and races, but usually starts before age 30. The scalp is the most commonly affected area, but the beard or any hair-bearing site can be affected. This type of hair loss does not leave scars where the hair was lost.    Many people with alopecia areata only have a few patches of hair loss. In others, extensive patchy hair loss occurs. In a few people, all scalp hair is lost (alopecia totalis), or hair is lost from the entire scalp and body (alopecia universalis). No matter how widespread the hair loss, the hair follicles remain alive and are ready to resume normal hair production whenever they receive the correct signal. Hair re-growth may occur without treatment and can even restart after years of hair loss.    CAUSES  It is thought that something triggers the immune system to stop hair growth. It is not always known what the cause is. Some people have genetic markers that can increase the chance of developing alopecia areata. Alopecia areata often occurs in families whose members have had:   Asthma.    Hay fever.    Atopic eczema.    Some autoimmune diseases may also be a trigger, such as:    Thyroid disease.    Diabetes.    Rheumatoid arthritis.    Lupus erythematosus.    Vitiligo.    Pernicious anemia.    Addison's disease.   OTHER SYMPTOMS  In some people, the nail beds may develop rows of tiny dents (stippling) or the nail beds can become distorted. Other than the hair and nail beds, no other body part is affected.    PROGNOSIS     Alopecia areata is not medically disabling. People with alopecia areata are usually in excellent health. Hair loss can be emotionally difficult. The National Alopecia Areata Foundation has resources available to help individuals and families with alopecia areata. Their goal is to help people with the condition live full, productive lives. There are many successful, well-adjusted, contented people living with Alopecia areata. Alopecia areata can be overcome with:   A positive self image.    Sound medical facts.    The support of others, such as:    Sometimes professional counseling is helpful to develop one's self-confidence and positive self-image.   TREATMENT  There is no cure for alopecia areata. There are several available treatments. Treatments are most effective in milder cases. No treatment is effective for everyone. Choice of treatment depends mainly on a person's age and the extent of their hair loss.  Alopecia areata occurs in two forms:     A mild patchy form where less than 50 percent of scalp hair is lost.    An extensive form where greater than 50 percent of scalp hair is lost.   These two forms of alopecia areata behave quite differently, and the choice of treatment depends on which form is present. Current treatments do not turn alopecia areata off. They can stimulate the hair follicle to produce hair.    Some medications used to treat mild cases include:      Cortisone injections. The most common treatment is the injection of cortisone into the bare skin patches. The injections are usually given by a caregiver specializing in skin issues (dermatologist). This caregiver will use a tiny needle to give multiple injections into the skin in and around the bare patches. The injections are repeated once a month. If new hair growth occurs, it is usually visible within 4 weeks. Treatment does not prevent new patches of hair loss from developing. There are few side effects from local cortisone injections. Occasionally, temporary dents (depressions) in the skin result from the local injections, but these dents can fill in by themselves.    Topical minoxidil. Five percent topical minoxidil solution applied twice daily may grow hair in alopecia areata. Scalp, eyebrows, and beard hair may respond. If scalp hair re-grows completely, treatment can be stopped. Response may improve if topical cortisone cream is applied 30 minutes after the minoxidil. Topical minoxidil is safe, easy to use, and does not lower blood pressure in persons with normal blood pressure. Minoxidil can lead to unwanted facial hair growth in some people.    Anthralin cream or ointment. Another treatment is the application of anthralin cream or ointment. Anthralin is a tar-like substance that has been used widely for psoriasis. Anthralin is applied to the bare patches once daily. It is washed off after a short time, usually 30 to 60 minutes later. If new hair growth occurs, it is seen in 8 to 12 weeks. Anthralin can be irritating to the skin. It can cause temporary, brownish discoloration of the treated skin. By using short treatment times, skin irritation and skin staining are reduced without decreasing effectiveness. Care must be taken not to get anthralin in the eyes.   Some of the medications used for more extensive cases where there is greater than 50% hair loss include:   Cortisone pills. Cortisone pills are sometimes given for extensive scalp hair loss. Cortisone taken internally is much stronger than local injections of cortisone into the skin. It is necessary to discuss possible side effects of cortisone pills with your caregiver. In general, however, cortisone pills are used in relatively few patients with alopecia areata due to health risks from prolonged use. Also, hair that has grown is likely to fall out when the cortisone pills are stopped.    Topical minoxidil. See previous explanation under mild, patchy alopecia areata. However, Minoxidil is not effective in total loss of scalp hair (alopecia totalis).    Topical immunotherapy. Another method of treating alopecia areata or alopecia totalis/universalis involves producing an allergic rash or allergic contact dermatitis. Chemicals such as diphencyprone (DPCP) or squaric acid dibutyl ester (SADBE) are applied to the scalp to produce an allergic rash which resembles poison oak or ivy. Approximately 40% of patients treated with topical immunotherapy will re-grow scalp hair after about 6 months of treatment. Those who do successfully re-grow scalp hair will need to continue treatment to maintain hair re-growth.    Wigs. For extensive hair loss, a wig can be an important option for some people. Proper attention will make a quality wig look completely natural. A wig will need to be cut, thinned, and styled. To keep a net base wig from falling off, special double-sided tape can be purchased in beauty supply outlets and fastened to the inside of the wig.     For those with completely bare heads, there are suction caps to which any wig can be attached. There are also entire suction cap wig units. These state-of-the-art   wigs which make use of a silicon base to create a secure, vacuum-fit, are comfortable and easily removed by the wearer. Proper fit of a vacuum wig requires that any existing scalp hair be shaved. These wigs can be more expensive than other types of wigs.   For more information contact the National Alopecia Areata Foundation at:  [facility]  [address]  [place] [address]  Phone: [phone]  Fax: [phone]  www.naaf.org  E-mail: info@naaf.org  Document Released: 02/01/2004 Document Re-Released: 12/17/2009  [employer] Patient Information 2011 [employer].

## 2011-01-15 NOTE — Progress Notes (Signed)
  Subjective:    Patient ID: Helen Wilson, female    DOB: 03/29/1932, 77 y.o.   MRN: [id number]  HPI  Pt here for f/u bp and c/o hair loss.  Pt states she is eating enough protein.   No other complaints.  Pt states she was seen by nephrology in April and while she was in [place] she got postcards here in [place] that said she had an appointment in June---It was supposed to be a 3 month appointment which would be in July.    Review of Systems As above    Objective:   Physical Exam  Constitutional: She is oriented to person, place, and time. She appears well-developed and well-nourished.  HENT:       Hair thinning  Neck: Normal range of motion. Neck supple.  Cardiovascular: Normal rate, regular rhythm, normal heart sounds and intact distal pulses.   No murmur heard. Pulmonary/Chest: Effort normal and breath sounds normal.  Abdominal: Soft. Bowel sounds are normal.  Musculoskeletal: She exhibits no edema and no tenderness.  Neurological: She is alert and oriented to person, place, and time.  Psychiatric: She has a normal mood and affect. Her behavior is normal. Judgment and thought content normal.          Assessment & Plan:

## 2011-01-15 NOTE — Assessment & Plan Note (Signed)
Check thyroid function Discussed diet with pt and eating enough protein Check labs Consider derm referral if no improvement

## 2011-01-27 ENCOUNTER — Encounter: Payer: Self-pay | Admitting: *Deleted

## 2011-02-20 ENCOUNTER — Telehealth: Payer: Self-pay | Admitting: *Deleted

## 2011-02-20 NOTE — Telephone Encounter (Signed)
Pt states that she was advise to have a parathyroid test done. Pt is gong next week for another physician to have some labs done and would like to get this test added. Please advise if Pt needs to have this testing done and Dx

## 2011-02-20 NOTE — Telephone Encounter (Signed)
Discuss with patient  

## 2011-02-20 NOTE — Telephone Encounter (Signed)
I usually only do parathyroid if calcium is high and it has been normal here.Helen Wilson

## 2011-04-08 ENCOUNTER — Ambulatory Visit (INDEPENDENT_AMBULATORY_CARE_PROVIDER_SITE_OTHER): Payer: Medicare Other | Admitting: Family Medicine

## 2011-04-08 ENCOUNTER — Ambulatory Visit (INDEPENDENT_AMBULATORY_CARE_PROVIDER_SITE_OTHER): Payer: Medicare Other | Admitting: Surgery

## 2011-04-08 ENCOUNTER — Encounter (INDEPENDENT_AMBULATORY_CARE_PROVIDER_SITE_OTHER): Payer: Self-pay | Admitting: Surgery

## 2011-04-08 ENCOUNTER — Encounter: Payer: Self-pay | Admitting: Family Medicine

## 2011-04-08 VITALS — BP 156/90 | HR 80 | Temp 97.1°F | Resp 16 | Ht 63.0 in | Wt 193.8 lb

## 2011-04-08 VITALS — BP 162/94 | HR 73 | Temp 97.1°F | Wt 193.0 lb

## 2011-04-08 DIAGNOSIS — I10 Essential (primary) hypertension: Secondary | ICD-10-CM

## 2011-04-08 DIAGNOSIS — K648 Other hemorrhoids: Secondary | ICD-10-CM | POA: Insufficient documentation

## 2011-04-08 MED ORDER — LISINOPRIL 20 MG PO TABS: 20.0000 mg | ORAL_TABLET | Freq: Every day | ORAL | Status: DC

## 2011-04-08 MED ORDER — HYDROCORTISONE ACETATE 25 MG RE SUPP: 25.0000 mg | Freq: Two times a day (BID) | RECTAL | Status: AC

## 2011-04-08 MED ORDER — HYDROCORTISONE ACE-PRAMOXINE 2.5-1 % RE CREA: TOPICAL_CREAM | Freq: Three times a day (TID) | RECTAL | Status: AC

## 2011-04-08 NOTE — Progress Notes (Signed)
  Subjective:    Patient here for follow-up of elevated blood pressure.  She is not exercising and is adherent to a low-salt diet.  Blood pressure is not well controlled at home. Cardiac symptoms: none. Patient denies: chest pain, chest pressure/discomfort, claudication, dyspnea, exertional chest pressure/discomfort, fatigue, irregular heart beat, lower extremity edema, near-syncope, orthopnea, palpitations, paroxysmal nocturnal dyspnea, syncope and tachypnea. Cardiovascular risk factors: hypertension, obesity (BMI >= 30 kg/m2) and sedentary lifestyle. Use of agents associated with hypertension: none. History of target organ damage: none.  The following portions of the patient's history were reviewed and updated as appropriate: allergies, current medications, past family history, past medical history, past social history, past surgical history and problem list.  Review of Systems Pertinent items are noted in HPI.     Objective:    BP 162/94  Pulse 73  Temp(Src) 97.1 F (36.2 C) (Oral)  Wt 193 lb (87.544 kg)  SpO2 97% General appearance: alert, cooperative, appears stated age and no distress Neck: no adenopathy, supple, symmetrical, trachea midline and thyroid not enlarged, symmetric, no tenderness/mass/nodules Lungs: clear to auscultation bilaterally Heart: regular rate and rhythm, S1, S2 normal, no murmur, click, rub or gallop Extremities: extremities normal, atraumatic, no cyanosis or edema    Assessment:    Hypertension, stage 1 . Evidence of target organ damage: none.    Plan:    Medication: increase to lisinopril 20. Dietary sodium restriction. Regular aerobic exercise. Follow up: 2 weeks and as needed.

## 2011-04-08 NOTE — Progress Notes (Signed)
Chief Complaint  Patient presents with  . Follow-up    [facility]     HISTORY: Patient is a 77 year old female self-referred for chronic problems with internal hemorrhoids. It has been several years since she last came to our office. A few weeks ago she was doing some lifting and in the yard. She developed prolapse. She has had mild to moderate pain. She denies any bleeding. She has had no recent anorectal procedures.   Past Medical History  Diagnosis Date  . Hyperlipidemia   . Hypertension   . Arthritis   . Hemorrhoids      Current Outpatient Prescriptions  Medication Sig Dispense Refill  . aspirin 81 MG tablet Take 81 mg by mouth daily.        . Calcium Carbonate (CALCIUM 600) 1500 MG TABS Take 1 tablet by mouth 2 (two) times daily.        . Calcium Carbonate-Vitamin D (CALCIUM-VITAMIN D) 600-200 MG-UNIT CAPS Take 2 tablets by mouth daily.        . fish oil-omega-3 fatty acids 1000 MG capsule Take 2 g by mouth daily.        Marland Kitchen lisinopril (PRINIVIL,ZESTRIL) 10 MG tablet Take 1 tablet (10 mg total) by mouth daily.  90 tablet  3  . meloxicam (MOBIC) 7.5 MG tablet Take 1 tablet (7.5 mg total) by mouth daily. prn  90 tablet  3  . Multiple Vitamin (MULTIVITAMIN) tablet Take 1 tablet by mouth daily.        . Multiple Vitamins-Minerals (SENIOR MULTIVITAMIN PLUS) TABS Take 1 tablet by mouth daily.        . sotalol (BETAPACE) 80 MG tablet Take 80 mg by mouth 2 (two) times daily.        . hydrocortisone (ANUSOL-HC) 25 MG suppository Place 1 suppository (25 mg total) rectally 2 (two) times daily.  24 suppository  1  . hydrocortisone-pramoxine (ANALPRAM-HC) 2.5-1 % rectal cream Place rectally 3 (three) times daily.  30 g  0     Allergies  Allergen Reactions  . Nifedipine Other (See Comments)    constipation     Family History  Problem Relation Age of Onset  . Arthritis    . Heart disease    . Parkinsonism Mother   . Rheum arthritis Mother   . Diabetes       History   Social  History  . Marital Status: Married    Spouse Name: N/A    Number of Children: N/A  . Years of Education: N/A   Social History Main Topics  . Smoking status: Never Smoker   . Smokeless tobacco: Never Used  . Alcohol Use: No  . Drug Use: No  . Sexually Active: None   Other Topics Concern  . None   Social History Narrative  . None     REVIEW OF SYSTEMS - PERTINENT POSITIVES ONLY: Patient notes moderate prolapse and moderate pain. Denies bleeding. Denies any recent change in bowel habit.   EXAM: Filed Vitals:   04/08/11 0919  BP: 156/90  Pulse: 80  Temp: 97.1 F (36.2 C)  Resp: 16    HEENT: normocephalic; pupils equal and reactive; sclerae clear; dentition good; mucous membranes moist NECK:  No palpable nodules; symmetric on extension; no palpable anterior or posterior cervical lymphadenopathy; no supraclavicular masses; no tenderness CHEST: clear to auscultation bilaterally without rales, rhonchi, or wheezes CARDIAC: regular rate and rhythm without significant murmur; peripheral pulses are full EXT:  non-tender without edema; no deformity ANORECTAL:  External hemorrhoidal skin tags surround the anus. There is slight prolapse in the anterior midline and left anterior position. There are prominent external hemorrhoidal veins but no evidence of thrombosis. There is no ulceration. There is no sign of bleeding. Digital rectal exam shows normal tone with mild edema in the anal canal. Anoscopy is performed. The rectal mucosa is normal. There are grade 1 internal hemorrhoids. There is no friability. There is no bleeding. NEURO: no gross focal deficits; no sign of tremor   LABORATORY RESULTS: See E-Chart for most recent results   RADIOLOGY RESULTS: See E-Chart or I-Site for most recent results   IMPRESSION: Grade 1-2 internal hemorrhoids with mild anterior prolapse.   PLAN: The patient can be managed conservatively at this point. I have prescribed Anusol-HC suppositories  twice daily for 10 days and topical Analpram with 2.5% hydrocortisone cream topically 3-4 times a day as needed. Usual instructions for anorectal complaints are given.  The patient will return as needed.   Velora Heckler, MD, FACS General & Endocrine Surgery [facility]      Visit Diagnoses: 1. Hemorrhoids, internal, with complication     Primary Care Physician: Loreen Freud, DO, DO

## 2011-04-08 NOTE — Patient Instructions (Signed)
Take Miralax (generic) once daily for one month.  ANORECTAL PROCEDURES: 1.  Tub soaks 2-3 times daily in warm water (may add Epsom salts if desired) 2.  Stool softener for one month (store brand Miralax or Colace) 3.  Avoid toilet paper - use baby wipes or Tucks pads 4.  Increase water intake - 6-8 glasses daily

## 2011-04-08 NOTE — Patient Instructions (Signed)

## 2011-04-20 ENCOUNTER — Telehealth: Payer: Self-pay | Admitting: Family Medicine

## 2011-04-20 ENCOUNTER — Other Ambulatory Visit: Payer: Self-pay | Admitting: Family Medicine

## 2011-04-20 DIAGNOSIS — I10 Essential (primary) hypertension: Secondary | ICD-10-CM

## 2011-04-20 NOTE — Telephone Encounter (Signed)
Pt will come 04/22/11 @ 10am.

## 2011-04-20 NOTE — Telephone Encounter (Signed)
Order entered

## 2011-04-22 ENCOUNTER — Other Ambulatory Visit: Payer: Medicare Other

## 2011-04-22 NOTE — Progress Notes (Signed)
12  

## 2011-04-23 ENCOUNTER — Other Ambulatory Visit (INDEPENDENT_AMBULATORY_CARE_PROVIDER_SITE_OTHER): Payer: Medicare Other

## 2011-04-23 DIAGNOSIS — I10 Essential (primary) hypertension: Secondary | ICD-10-CM

## 2011-04-23 LAB — LIPID PANEL
Cholesterol: 238 mg/dL — ABNORMAL HIGH (ref 0–200)
HDL: 73 mg/dL (ref >39.00)
Total CHOL/HDL Ratio: 3
Triglycerides: 85 mg/dL (ref 0.0–149.0)
VLDL: 17 mg/dL (ref 0.0–40.0)

## 2011-04-23 LAB — BASIC METABOLIC PANEL
BUN: 27 mg/dL — ABNORMAL HIGH (ref 6–23)
CO2: 27 mEq/L (ref 19–32)
Calcium: 9 mg/dL (ref 8.4–10.5)
Chloride: 111 mEq/L (ref 96–112)
Creatinine, Ser: 1.1 mg/dL (ref 0.4–1.2)
GFR: 51.71 mL/min — ABNORMAL LOW (ref >60.00)
Glucose, Bld: 102 mg/dL — ABNORMAL HIGH (ref 70–99)
Potassium: 4 mEq/L (ref 3.5–5.1)
Sodium: 144 mEq/L (ref 135–145)

## 2011-04-23 LAB — HEPATIC FUNCTION PANEL
ALT: 23 U/L (ref 0–35)
AST: 22 U/L (ref 0–37)
Albumin: 3.8 g/dL (ref 3.5–5.2)
Alkaline Phosphatase: 76 U/L (ref 39–117)
Bilirubin, Direct: 0 mg/dL (ref 0.0–0.3)
Total Bilirubin: 0.6 mg/dL (ref 0.3–1.2)
Total Protein: 6.8 g/dL (ref 6.0–8.3)

## 2011-04-23 LAB — LDL CHOLESTEROL, DIRECT: Direct LDL: 150.9 mg/dL

## 2011-04-23 NOTE — Progress Notes (Signed)
Labs only

## 2011-04-24 ENCOUNTER — Encounter: Payer: Self-pay | Admitting: *Deleted

## 2011-05-08 ENCOUNTER — Telehealth: Payer: Self-pay | Admitting: Family Medicine

## 2011-05-08 NOTE — Telephone Encounter (Signed)
Patient wants to know if she should continue getting treatment at [facility] - she discussed with dr Laury Axon & she cancelled her appts with the kidney center - she wants to know if she did the right thing

## 2011-05-08 NOTE — Telephone Encounter (Signed)
Last time I spoke with her I told her to go to them one more time and discuss whether or not they were ok with her f/u here and we could just send her back if there was a problem.

## 2011-05-08 NOTE — Telephone Encounter (Signed)
Discussed with patient and she stated she made the Dr. Eather Colas in August when she went to see them that she was not going to come back because she was not having any issues, she stated all they were telling her to do was to drink water. She stated she would rather follow up with Dr.Lowne since she is not having any concerns and if needed she will go back to see them    KP

## 2011-05-08 NOTE — Telephone Encounter (Signed)
Please advise      KP 

## 2012-01-04 DIAGNOSIS — L570 Actinic keratosis: Secondary | ICD-10-CM | POA: Diagnosis not present

## 2012-02-11 DIAGNOSIS — H353290 Exudative age-related macular degeneration, unspecified eye, stage unspecified: Secondary | ICD-10-CM | POA: Diagnosis not present

## 2012-02-11 DIAGNOSIS — H269 Unspecified cataract: Secondary | ICD-10-CM | POA: Diagnosis not present

## 2012-02-11 DIAGNOSIS — H43819 Vitreous degeneration, unspecified eye: Secondary | ICD-10-CM | POA: Diagnosis not present

## 2012-02-11 DIAGNOSIS — H3530 Unspecified macular degeneration: Secondary | ICD-10-CM | POA: Diagnosis not present

## 2012-02-15 ENCOUNTER — Encounter: Payer: Self-pay | Admitting: Family Medicine

## 2012-02-15 ENCOUNTER — Ambulatory Visit (INDEPENDENT_AMBULATORY_CARE_PROVIDER_SITE_OTHER): Payer: Medicare Other | Admitting: Family Medicine

## 2012-02-15 VITALS — BP 148/82 | HR 68 | Temp 98.1°F | Wt 192.6 lb

## 2012-02-15 DIAGNOSIS — N390 Urinary tract infection, site not specified: Secondary | ICD-10-CM

## 2012-02-15 DIAGNOSIS — I10 Essential (primary) hypertension: Secondary | ICD-10-CM | POA: Diagnosis not present

## 2012-02-15 DIAGNOSIS — M1990 Unspecified osteoarthritis, unspecified site: Secondary | ICD-10-CM

## 2012-02-15 LAB — BASIC METABOLIC PANEL
BUN: 28 mg/dL — ABNORMAL HIGH (ref 6–23)
CO2: 25 mEq/L (ref 19–32)
Calcium: 9.4 mg/dL (ref 8.4–10.5)
Chloride: 110 mEq/L (ref 96–112)
Creatinine, Ser: 1.2 mg/dL (ref 0.4–1.2)
GFR: 46.18 mL/min — ABNORMAL LOW (ref >60.00)
Glucose, Bld: 106 mg/dL — ABNORMAL HIGH (ref 70–99)
Potassium: 4.2 mEq/L (ref 3.5–5.1)
Sodium: 141 mEq/L (ref 135–145)

## 2012-02-15 LAB — POCT URINALYSIS DIPSTICK
Bilirubin, UA: NEGATIVE
Blood, UA: NEGATIVE
Glucose, UA: NEGATIVE
Ketones, UA: NEGATIVE
Nitrite, UA: NEGATIVE
Protein, UA: NEGATIVE
Spec Grav, UA: <=1.005
Urobilinogen, UA: 0.2
pH, UA: 6.5

## 2012-02-15 LAB — LIPID PANEL
Cholesterol: 240 mg/dL — ABNORMAL HIGH (ref 0–200)
HDL: 71.9 mg/dL (ref >39.00)
Total CHOL/HDL Ratio: 3
Triglycerides: 79 mg/dL (ref 0.0–149.0)
VLDL: 15.8 mg/dL (ref 0.0–40.0)

## 2012-02-15 LAB — HEPATIC FUNCTION PANEL
ALT: 21 U/L (ref 0–35)
AST: 22 U/L (ref 0–37)
Albumin: 3.9 g/dL (ref 3.5–5.2)
Alkaline Phosphatase: 66 U/L (ref 39–117)
Bilirubin, Direct: 0 mg/dL (ref 0.0–0.3)
Total Bilirubin: 0.8 mg/dL (ref 0.3–1.2)
Total Protein: 7.3 g/dL (ref 6.0–8.3)

## 2012-02-15 LAB — LDL CHOLESTEROL, DIRECT: Direct LDL: 147.6 mg/dL

## 2012-02-15 MED ORDER — LISINOPRIL 20 MG PO TABS: 20.0000 mg | ORAL_TABLET | Freq: Every day | ORAL | Status: DC

## 2012-02-15 MED ORDER — MELOXICAM 7.5 MG PO TABS: 7.5000 mg | ORAL_TABLET | Freq: Every day | ORAL | Status: DC

## 2012-02-15 NOTE — Progress Notes (Signed)
  Subjective:    Patient here for follow-up of elevated blood pressure.  She is exercising and is adherent to a low-salt diet.  Blood pressure is well controlled at home. Cardiac symptoms: none. Patient denies: chest pain, chest pressure/discomfort, claudication, dyspnea, exertional chest pressure/discomfort, fatigue, irregular heart beat, lower extremity edema, near-syncope, orthopnea, palpitations, paroxysmal nocturnal dyspnea, syncope and tachypnea. Cardiovascular risk factors: advanced age (older than 74 for men, 32 for women) and hypertension. Use of agents associated with hypertension: none. History of target organ damage: none.  The following portions of the patient's history were reviewed and updated as appropriate: allergies, current medications, past family history, past medical history, past social history, past surgical history and problem list.  Review of Systems Pertinent items are noted in HPI.     Objective:    BP 148/82  Pulse 68  Temp 98.1 F (36.7 C) (Oral)  Wt 192 lb 9.6 oz (87.363 kg)  SpO2 97% General appearance: alert, cooperative, appears stated age and no distress Lungs: clear to auscultation bilaterally Heart: S1, S2 normal Extremities: extremities normal, atraumatic, no cyanosis or edema    Assessment:    Hypertension, stage 1 . Evidence of target organ damage: none.    Plan:    Medication: no change. Dietary sodium restriction. Regular aerobic exercise. Check blood pressures 2-3 times weekly and record. Follow up: 6 months and as needed.

## 2012-02-15 NOTE — Patient Instructions (Addendum)

## 2012-02-15 NOTE — Addendum Note (Signed)
Addended by: Silvio Pate D on: 02/15/2012 02:46 PM   Modules accepted: Orders

## 2012-02-16 DIAGNOSIS — M5137 Other intervertebral disc degeneration, lumbosacral region: Secondary | ICD-10-CM | POA: Diagnosis not present

## 2012-02-16 DIAGNOSIS — IMO0002 Reserved for concepts with insufficient information to code with codable children: Secondary | ICD-10-CM | POA: Diagnosis not present

## 2012-02-16 DIAGNOSIS — M25559 Pain in unspecified hip: Secondary | ICD-10-CM | POA: Diagnosis not present

## 2012-02-17 LAB — URINE CULTURE: Colony Count: >=100000

## 2012-02-19 DIAGNOSIS — M545 Low back pain, unspecified: Secondary | ICD-10-CM | POA: Diagnosis not present

## 2012-02-19 DIAGNOSIS — M5137 Other intervertebral disc degeneration, lumbosacral region: Secondary | ICD-10-CM | POA: Diagnosis not present

## 2012-02-19 DIAGNOSIS — M47817 Spondylosis without myelopathy or radiculopathy, lumbosacral region: Secondary | ICD-10-CM | POA: Diagnosis not present

## 2012-02-22 ENCOUNTER — Encounter: Payer: Self-pay | Admitting: *Deleted

## 2012-02-25 DIAGNOSIS — H269 Unspecified cataract: Secondary | ICD-10-CM | POA: Diagnosis not present

## 2012-02-25 DIAGNOSIS — H3530 Unspecified macular degeneration: Secondary | ICD-10-CM | POA: Diagnosis not present

## 2012-02-25 DIAGNOSIS — H353290 Exudative age-related macular degeneration, unspecified eye, stage unspecified: Secondary | ICD-10-CM | POA: Diagnosis not present

## 2012-02-25 DIAGNOSIS — H43819 Vitreous degeneration, unspecified eye: Secondary | ICD-10-CM | POA: Diagnosis not present

## 2012-03-09 DIAGNOSIS — M47817 Spondylosis without myelopathy or radiculopathy, lumbosacral region: Secondary | ICD-10-CM | POA: Diagnosis not present

## 2012-03-09 DIAGNOSIS — M76899 Other specified enthesopathies of unspecified lower limb, excluding foot: Secondary | ICD-10-CM | POA: Diagnosis not present

## 2012-03-09 DIAGNOSIS — M5137 Other intervertebral disc degeneration, lumbosacral region: Secondary | ICD-10-CM | POA: Diagnosis not present

## 2012-03-09 DIAGNOSIS — IMO0002 Reserved for concepts with insufficient information to code with codable children: Secondary | ICD-10-CM | POA: Diagnosis not present

## 2012-03-10 DIAGNOSIS — H3530 Unspecified macular degeneration: Secondary | ICD-10-CM | POA: Diagnosis not present

## 2012-03-10 DIAGNOSIS — H269 Unspecified cataract: Secondary | ICD-10-CM | POA: Diagnosis not present

## 2012-03-10 DIAGNOSIS — H43819 Vitreous degeneration, unspecified eye: Secondary | ICD-10-CM | POA: Diagnosis not present

## 2012-03-10 DIAGNOSIS — H353290 Exudative age-related macular degeneration, unspecified eye, stage unspecified: Secondary | ICD-10-CM | POA: Diagnosis not present

## 2012-03-16 ENCOUNTER — Telehealth: Payer: Self-pay

## 2012-03-16 NOTE — Telephone Encounter (Signed)
Patient came into the office to review labs, we reviewed her labs and she sated Dr.Lowne Increases her Lisinopril without telling her at her last visit. I made the patient aware that her medication was increased to 20 mg 04/07/11 when she came in for elevated BP during her surgical clearance. She said she never started the 20 mg because she still has the 10 mg and she just continued the 10 mg. She wanted to know if she should just start the Lisinopril 20, I made her aware I needed to discuss with Dr.Lowne. After reviewing chart and last BP Dr.Lowne wanted the patient to start the Lisinopril 20 mg as directed. The patient voiced understanding.    KP

## 2012-03-29 DIAGNOSIS — L820 Inflamed seborrheic keratosis: Secondary | ICD-10-CM | POA: Diagnosis not present

## 2012-03-29 DIAGNOSIS — Z85828 Personal history of other malignant neoplasm of skin: Secondary | ICD-10-CM | POA: Diagnosis not present

## 2012-03-29 DIAGNOSIS — L570 Actinic keratosis: Secondary | ICD-10-CM | POA: Diagnosis not present

## 2012-04-07 DIAGNOSIS — H43819 Vitreous degeneration, unspecified eye: Secondary | ICD-10-CM | POA: Diagnosis not present

## 2012-04-07 DIAGNOSIS — H269 Unspecified cataract: Secondary | ICD-10-CM | POA: Diagnosis not present

## 2012-04-07 DIAGNOSIS — H3530 Unspecified macular degeneration: Secondary | ICD-10-CM | POA: Diagnosis not present

## 2012-04-07 DIAGNOSIS — H353290 Exudative age-related macular degeneration, unspecified eye, stage unspecified: Secondary | ICD-10-CM | POA: Diagnosis not present

## 2012-04-28 DIAGNOSIS — H353290 Exudative age-related macular degeneration, unspecified eye, stage unspecified: Secondary | ICD-10-CM | POA: Diagnosis not present

## 2012-04-28 DIAGNOSIS — H43819 Vitreous degeneration, unspecified eye: Secondary | ICD-10-CM | POA: Diagnosis not present

## 2012-04-28 DIAGNOSIS — H3530 Unspecified macular degeneration: Secondary | ICD-10-CM | POA: Diagnosis not present

## 2012-04-28 DIAGNOSIS — H269 Unspecified cataract: Secondary | ICD-10-CM | POA: Diagnosis not present

## 2012-05-25 DIAGNOSIS — L820 Inflamed seborrheic keratosis: Secondary | ICD-10-CM | POA: Diagnosis not present

## 2012-05-25 DIAGNOSIS — R21 Rash and other nonspecific skin eruption: Secondary | ICD-10-CM | POA: Diagnosis not present

## 2012-05-30 DIAGNOSIS — I4891 Unspecified atrial fibrillation: Secondary | ICD-10-CM | POA: Diagnosis not present

## 2012-05-30 DIAGNOSIS — E785 Hyperlipidemia, unspecified: Secondary | ICD-10-CM | POA: Diagnosis not present

## 2012-06-02 DIAGNOSIS — H353290 Exudative age-related macular degeneration, unspecified eye, stage unspecified: Secondary | ICD-10-CM | POA: Diagnosis not present

## 2012-06-02 DIAGNOSIS — H3530 Unspecified macular degeneration: Secondary | ICD-10-CM | POA: Diagnosis not present

## 2012-06-02 DIAGNOSIS — H269 Unspecified cataract: Secondary | ICD-10-CM | POA: Diagnosis not present

## 2012-06-02 DIAGNOSIS — H43819 Vitreous degeneration, unspecified eye: Secondary | ICD-10-CM | POA: Diagnosis not present

## 2012-06-23 DIAGNOSIS — H43819 Vitreous degeneration, unspecified eye: Secondary | ICD-10-CM | POA: Diagnosis not present

## 2012-06-23 DIAGNOSIS — H3530 Unspecified macular degeneration: Secondary | ICD-10-CM | POA: Diagnosis not present

## 2012-06-23 DIAGNOSIS — H269 Unspecified cataract: Secondary | ICD-10-CM | POA: Diagnosis not present

## 2012-06-23 DIAGNOSIS — H353290 Exudative age-related macular degeneration, unspecified eye, stage unspecified: Secondary | ICD-10-CM | POA: Diagnosis not present

## 2012-07-21 DIAGNOSIS — H43819 Vitreous degeneration, unspecified eye: Secondary | ICD-10-CM | POA: Diagnosis not present

## 2012-07-21 DIAGNOSIS — H353290 Exudative age-related macular degeneration, unspecified eye, stage unspecified: Secondary | ICD-10-CM | POA: Diagnosis not present

## 2012-07-21 DIAGNOSIS — H3530 Unspecified macular degeneration: Secondary | ICD-10-CM | POA: Diagnosis not present

## 2012-07-21 DIAGNOSIS — H269 Unspecified cataract: Secondary | ICD-10-CM | POA: Diagnosis not present

## 2012-09-01 DIAGNOSIS — H269 Unspecified cataract: Secondary | ICD-10-CM | POA: Diagnosis not present

## 2012-09-01 DIAGNOSIS — H3530 Unspecified macular degeneration: Secondary | ICD-10-CM | POA: Diagnosis not present

## 2012-09-01 DIAGNOSIS — H43819 Vitreous degeneration, unspecified eye: Secondary | ICD-10-CM | POA: Diagnosis not present

## 2012-09-01 DIAGNOSIS — H353290 Exudative age-related macular degeneration, unspecified eye, stage unspecified: Secondary | ICD-10-CM | POA: Diagnosis not present

## 2012-09-08 DIAGNOSIS — S9030XA Contusion of unspecified foot, initial encounter: Secondary | ICD-10-CM | POA: Diagnosis not present

## 2012-09-08 DIAGNOSIS — S92919A Unspecified fracture of unspecified toe(s), initial encounter for closed fracture: Secondary | ICD-10-CM | POA: Diagnosis not present

## 2012-09-26 ENCOUNTER — Other Ambulatory Visit: Payer: Self-pay | Admitting: Family Medicine

## 2012-09-29 DIAGNOSIS — H43819 Vitreous degeneration, unspecified eye: Secondary | ICD-10-CM | POA: Diagnosis not present

## 2012-09-29 DIAGNOSIS — H3530 Unspecified macular degeneration: Secondary | ICD-10-CM | POA: Diagnosis not present

## 2012-09-29 DIAGNOSIS — H269 Unspecified cataract: Secondary | ICD-10-CM | POA: Diagnosis not present

## 2012-09-29 DIAGNOSIS — H353290 Exudative age-related macular degeneration, unspecified eye, stage unspecified: Secondary | ICD-10-CM | POA: Diagnosis not present

## 2012-10-27 DIAGNOSIS — H3530 Unspecified macular degeneration: Secondary | ICD-10-CM | POA: Diagnosis not present

## 2012-10-27 DIAGNOSIS — H353290 Exudative age-related macular degeneration, unspecified eye, stage unspecified: Secondary | ICD-10-CM | POA: Diagnosis not present

## 2012-10-27 DIAGNOSIS — H269 Unspecified cataract: Secondary | ICD-10-CM | POA: Diagnosis not present

## 2012-10-27 DIAGNOSIS — H43819 Vitreous degeneration, unspecified eye: Secondary | ICD-10-CM | POA: Diagnosis not present

## 2012-11-24 DIAGNOSIS — H3530 Unspecified macular degeneration: Secondary | ICD-10-CM | POA: Diagnosis not present

## 2012-11-24 DIAGNOSIS — H269 Unspecified cataract: Secondary | ICD-10-CM | POA: Diagnosis not present

## 2012-11-24 DIAGNOSIS — H43819 Vitreous degeneration, unspecified eye: Secondary | ICD-10-CM | POA: Diagnosis not present

## 2012-11-24 DIAGNOSIS — H353290 Exudative age-related macular degeneration, unspecified eye, stage unspecified: Secondary | ICD-10-CM | POA: Diagnosis not present

## 2012-12-07 DIAGNOSIS — L570 Actinic keratosis: Secondary | ICD-10-CM | POA: Diagnosis not present

## 2012-12-07 DIAGNOSIS — C44319 Basal cell carcinoma of skin of other parts of face: Secondary | ICD-10-CM | POA: Diagnosis not present

## 2012-12-15 ENCOUNTER — Other Ambulatory Visit: Payer: Self-pay | Admitting: Family Medicine

## 2012-12-15 DIAGNOSIS — H353290 Exudative age-related macular degeneration, unspecified eye, stage unspecified: Secondary | ICD-10-CM | POA: Diagnosis not present

## 2012-12-15 DIAGNOSIS — H3530 Unspecified macular degeneration: Secondary | ICD-10-CM | POA: Diagnosis not present

## 2012-12-15 DIAGNOSIS — H43819 Vitreous degeneration, unspecified eye: Secondary | ICD-10-CM | POA: Diagnosis not present

## 2012-12-15 DIAGNOSIS — H269 Unspecified cataract: Secondary | ICD-10-CM | POA: Diagnosis not present

## 2012-12-15 NOTE — Telephone Encounter (Signed)
Last seen and filled 02/15/12 #90 with 3 refills.     KP

## 2013-01-05 DIAGNOSIS — H3530 Unspecified macular degeneration: Secondary | ICD-10-CM | POA: Diagnosis not present

## 2013-01-05 DIAGNOSIS — H43819 Vitreous degeneration, unspecified eye: Secondary | ICD-10-CM | POA: Diagnosis not present

## 2013-01-05 DIAGNOSIS — H269 Unspecified cataract: Secondary | ICD-10-CM | POA: Diagnosis not present

## 2013-01-05 DIAGNOSIS — H353290 Exudative age-related macular degeneration, unspecified eye, stage unspecified: Secondary | ICD-10-CM | POA: Diagnosis not present

## 2013-01-11 ENCOUNTER — Other Ambulatory Visit: Payer: Self-pay | Admitting: Family Medicine

## 2013-02-02 DIAGNOSIS — H43819 Vitreous degeneration, unspecified eye: Secondary | ICD-10-CM | POA: Diagnosis not present

## 2013-02-02 DIAGNOSIS — H3530 Unspecified macular degeneration: Secondary | ICD-10-CM | POA: Diagnosis not present

## 2013-02-02 DIAGNOSIS — H353290 Exudative age-related macular degeneration, unspecified eye, stage unspecified: Secondary | ICD-10-CM | POA: Diagnosis not present

## 2013-02-02 DIAGNOSIS — H269 Unspecified cataract: Secondary | ICD-10-CM | POA: Diagnosis not present

## 2013-03-06 ENCOUNTER — Ambulatory Visit (INDEPENDENT_AMBULATORY_CARE_PROVIDER_SITE_OTHER): Payer: Medicare Other | Admitting: Family Medicine

## 2013-03-06 ENCOUNTER — Encounter: Payer: Self-pay | Admitting: Family Medicine

## 2013-03-06 VITALS — BP 132/80 | HR 56 | Temp 98.6°F | Ht 63.0 in | Wt 195.6 lb

## 2013-03-06 DIAGNOSIS — I10 Essential (primary) hypertension: Secondary | ICD-10-CM | POA: Diagnosis not present

## 2013-03-06 DIAGNOSIS — Z0000 Encounter for general adult medical examination without abnormal findings: Secondary | ICD-10-CM

## 2013-03-06 DIAGNOSIS — M1990 Unspecified osteoarthritis, unspecified site: Secondary | ICD-10-CM | POA: Diagnosis not present

## 2013-03-06 DIAGNOSIS — E785 Hyperlipidemia, unspecified: Secondary | ICD-10-CM

## 2013-03-06 DIAGNOSIS — E669 Obesity, unspecified: Secondary | ICD-10-CM | POA: Insufficient documentation

## 2013-03-06 DIAGNOSIS — N390 Urinary tract infection, site not specified: Secondary | ICD-10-CM | POA: Diagnosis not present

## 2013-03-06 LAB — BASIC METABOLIC PANEL
BUN: 29 mg/dL — ABNORMAL HIGH (ref 6–23)
CO2: 26 mEq/L (ref 19–32)
Calcium: 9.9 mg/dL (ref 8.4–10.5)
Chloride: 108 mEq/L (ref 96–112)
Creatinine, Ser: 1.2 mg/dL (ref 0.4–1.2)
GFR: 46.06 mL/min — ABNORMAL LOW (ref >60.00)
Glucose, Bld: 88 mg/dL (ref 70–99)
Potassium: 4.2 mEq/L (ref 3.5–5.1)
Sodium: 140 mEq/L (ref 135–145)

## 2013-03-06 LAB — CBC WITH DIFFERENTIAL/PLATELET
Basophils Absolute: 0 10*3/uL (ref 0.0–0.1)
Basophils Relative: 0.6 % (ref 0.0–3.0)
Eosinophils Absolute: 0.5 10*3/uL (ref 0.0–0.7)
Eosinophils Relative: 6.2 % — ABNORMAL HIGH (ref 0.0–5.0)
HCT: 42.3 % (ref 36.0–46.0)
Hemoglobin: 14.4 g/dL (ref 12.0–15.0)
Lymphocytes Relative: 30.5 % (ref 12.0–46.0)
Lymphs Abs: 2.4 10*3/uL (ref 0.7–4.0)
MCHC: 34.1 g/dL (ref 30.0–36.0)
MCV: 93 fl (ref 78.0–100.0)
Monocytes Absolute: 0.6 10*3/uL (ref 0.1–1.0)
Monocytes Relative: 7.8 % (ref 3.0–12.0)
Neutro Abs: 4.4 10*3/uL (ref 1.4–7.7)
Neutrophils Relative %: 54.9 % (ref 43.0–77.0)
Platelets: 192 10*3/uL (ref 150.0–400.0)
RBC: 4.55 Mil/uL (ref 3.87–5.11)
RDW: 14.1 % (ref 11.5–14.6)
WBC: 8 10*3/uL (ref 4.5–10.5)

## 2013-03-06 LAB — POCT URINALYSIS DIPSTICK
Bilirubin, UA: NEGATIVE
Blood, UA: NEGATIVE
Glucose, UA: NEGATIVE
Ketones, UA: NEGATIVE
Nitrite, UA: NEGATIVE
Protein, UA: NEGATIVE
Spec Grav, UA: <=1.005
Urobilinogen, UA: 0.2
pH, UA: 6

## 2013-03-06 LAB — HEPATIC FUNCTION PANEL
ALT: 17 U/L (ref 0–35)
AST: 21 U/L (ref 0–37)
Albumin: 4 g/dL (ref 3.5–5.2)
Alkaline Phosphatase: 64 U/L (ref 39–117)
Bilirubin, Direct: 0.1 mg/dL (ref 0.0–0.3)
Total Bilirubin: 0.8 mg/dL (ref 0.3–1.2)
Total Protein: 7.3 g/dL (ref 6.0–8.3)

## 2013-03-06 LAB — LIPID PANEL
Cholesterol: 254 mg/dL — ABNORMAL HIGH (ref 0–200)
HDL: 54.9 mg/dL (ref >39.00)
Total CHOL/HDL Ratio: 5
Triglycerides: 209 mg/dL — ABNORMAL HIGH (ref 0.0–149.0)
VLDL: 41.8 mg/dL — ABNORMAL HIGH (ref 0.0–40.0)

## 2013-03-06 LAB — LDL CHOLESTEROL, DIRECT: Direct LDL: 173.7 mg/dL

## 2013-03-06 MED ORDER — MELOXICAM 7.5 MG PO TABS: ORAL_TABLET | ORAL | Status: DC

## 2013-03-06 MED ORDER — LISINOPRIL 20 MG PO TABS: 20.0000 mg | ORAL_TABLET | Freq: Every day | ORAL | Status: DC

## 2013-03-06 NOTE — Assessment & Plan Note (Signed)
CHECK LABS CON'T MEDS

## 2013-03-06 NOTE — Patient Instructions (Addendum)
Preventive Care for Adults, Female A healthy lifestyle and preventive care can promote health and wellness. Preventive health guidelines for women include the following key practices.  A routine yearly physical is a good way to check with your caregiver about your health and preventive screening. It is a chance to share any concerns and updates on your health, and to receive a thorough exam.  Visit your dentist for a routine exam and preventive care every 6 months. Brush your teeth twice a day and floss once a day. Good oral hygiene prevents tooth decay and gum disease.  The frequency of eye exams is based on your age, health, family medical history, use of contact lenses, and other factors. Follow your caregiver's recommendations for frequency of eye exams.  Eat a healthy diet. Foods like vegetables, fruits, whole grains, low-fat dairy products, and lean protein foods contain the nutrients you need without too many calories. Decrease your intake of foods high in solid fats, added sugars, and salt. Eat the right amount of calories for you.Get information about a proper diet from your caregiver, if necessary.  Regular physical exercise is one of the most important things you can do for your health. Most adults should get at least 150 minutes of moderate-intensity exercise (any activity that increases your heart rate and causes you to sweat) each week. In addition, most adults need muscle-strengthening exercises on 2 or more days a week.  Maintain a healthy weight. The body mass index (BMI) is a screening tool to identify possible weight problems. It provides an estimate of body fat based on height and weight. Your caregiver can help determine your BMI, and can help you achieve or maintain a healthy weight.For adults 20 years and older:  A BMI below 18.5 is considered underweight.  A BMI of 18.5 to 24.9 is normal.  A BMI of 25 to 29.9 is considered overweight.  A BMI of 30 and above is  considered obese.  Maintain normal blood lipids and cholesterol levels by exercising and minimizing your intake of saturated fat. Eat a balanced diet with plenty of fruit and vegetables. Blood tests for lipids and cholesterol should begin at age 20 and be repeated every 5 years. If your lipid or cholesterol levels are high, you are over 50, or you are at high risk for heart disease, you may need your cholesterol levels checked more frequently.Ongoing high lipid and cholesterol levels should be treated with medicines if diet and exercise are not effective.  If you smoke, find out from your caregiver how to quit. If you do not use tobacco, do not start.  If you are pregnant, do not drink alcohol. If you are breastfeeding, be very cautious about drinking alcohol. If you are not pregnant and choose to drink alcohol, do not exceed 1 drink per day. One drink is considered to be 12 ounces (355 mL) of beer, 5 ounces (148 mL) of wine, or 1.5 ounces (44 mL) of liquor.  Avoid use of street drugs. Do not share needles with anyone. Ask for help if you need support or instructions about stopping the use of drugs.  High blood pressure causes heart disease and increases the risk of stroke. Your blood pressure should be checked at least every 1 to 2 years. Ongoing high blood pressure should be treated with medicines if weight loss and exercise are not effective.  If you are 55 to 77 years old, ask your caregiver if you should take aspirin to prevent strokes.  Diabetes   screening involves taking a blood sample to check your fasting blood sugar level. This should be done once every 3 years, after age 45, if you are within normal weight and without risk factors for diabetes. Testing should be considered at a younger age or be carried out more frequently if you are overweight and have at least 1 risk factor for diabetes.  Breast cancer screening is essential preventive care for women. You should practice "breast  self-awareness." This means understanding the normal appearance and feel of your breasts and may include breast self-examination. Any changes detected, no matter how small, should be reported to a caregiver. Women in their 20s and 30s should have a clinical breast exam (CBE) by a caregiver as part of a regular health exam every 1 to 3 years. After age 40, women should have a CBE every year. Starting at age 40, women should consider having a mammography (breast X-ray test) every year. Women who have a family history of breast cancer should talk to their caregiver about genetic screening. Women at a high risk of breast cancer should talk to their caregivers about having magnetic resonance imaging (MRI) and a mammography every year.  The Pap test is a screening test for cervical cancer. A Pap test can show cell changes on the cervix that might become cervical cancer if left untreated. A Pap test is a procedure in which cells are obtained and examined from the lower end of the uterus (cervix).  Women should have a Pap test starting at age 21.  Between ages 21 and 29, Pap tests should be repeated every 2 years.  Beginning at age 30, you should have a Pap test every 3 years as long as the past 3 Pap tests have been normal.  Some women have medical problems that increase the chance of getting cervical cancer. Talk to your caregiver about these problems. It is especially important to talk to your caregiver if a new problem develops soon after your last Pap test. In these cases, your caregiver may recommend more frequent screening and Pap tests.  The above recommendations are the same for women who have or have not gotten the vaccine for human papillomavirus (HPV).  If you had a hysterectomy for a problem that was not cancer or a condition that could lead to cancer, then you no longer need Pap tests. Even if you no longer need a Pap test, a regular exam is a good idea to make sure no other problems are  starting.  If you are between ages 65 and 70, and you have had normal Pap tests going back 10 years, you no longer need Pap tests. Even if you no longer need a Pap test, a regular exam is a good idea to make sure no other problems are starting.  If you have had past treatment for cervical cancer or a condition that could lead to cancer, you need Pap tests and screening for cancer for at least 20 years after your treatment.  If Pap tests have been discontinued, risk factors (such as a new sexual partner) need to be reassessed to determine if screening should be resumed.  The HPV test is an additional test that may be used for cervical cancer screening. The HPV test looks for the virus that can cause the cell changes on the cervix. The cells collected during the Pap test can be tested for HPV. The HPV test could be used to screen women aged 30 years and older, and should   be used in women of any age who have unclear Pap test results. After the age of 30, women should have HPV testing at the same frequency as a Pap test.  Colorectal cancer can be detected and often prevented. Most routine colorectal cancer screening begins at the age of 50 and continues through age 75. However, your caregiver may recommend screening at an earlier age if you have risk factors for colon cancer. On a yearly basis, your caregiver may provide home test kits to check for hidden blood in the stool. Use of a small camera at the end of a tube, to directly examine the colon (sigmoidoscopy or colonoscopy), can detect the earliest forms of colorectal cancer. Talk to your caregiver about this at age 50, when routine screening begins. Direct examination of the colon should be repeated every 5 to 10 years through age 75, unless early forms of pre-cancerous polyps or small growths are found.  Hepatitis C blood testing is recommended for all people born from 1945 through 1965 and any individual with known risks for hepatitis C.  Practice  safe sex. Use condoms and avoid high-risk sexual practices to reduce the spread of sexually transmitted infections (STIs). STIs include gonorrhea, chlamydia, syphilis, trichomonas, herpes, HPV, and human immunodeficiency virus (HIV). Herpes, HIV, and HPV are viral illnesses that have no cure. They can result in disability, cancer, and death. Sexually active women aged 25 and younger should be checked for chlamydia. Older women with new or multiple partners should also be tested for chlamydia. Testing for other STIs is recommended if you are sexually active and at increased risk.  Osteoporosis is a disease in which the bones lose minerals and strength with aging. This can result in serious bone fractures. The risk of osteoporosis can be identified using a bone density scan. Women ages 65 and over and women at risk for fractures or osteoporosis should discuss screening with their caregivers. Ask your caregiver whether you should take a calcium supplement or vitamin D to reduce the rate of osteoporosis.  Menopause can be associated with physical symptoms and risks. Hormone replacement therapy is available to decrease symptoms and risks. You should talk to your caregiver about whether hormone replacement therapy is right for you.  Use sunscreen with sun protection factor (SPF) of 30 or more. Apply sunscreen liberally and repeatedly throughout the day. You should seek shade when your shadow is shorter than you. Protect yourself by wearing long sleeves, pants, a wide-brimmed hat, and sunglasses year round, whenever you are outdoors.  Once a month, do a whole body skin exam, using a mirror to look at the skin on your back. Notify your caregiver of new moles, moles that have irregular borders, moles that are larger than a pencil eraser, or moles that have changed in shape or color.  Stay current with required immunizations.  Influenza. You need a dose every fall (or winter). The composition of the flu vaccine  changes each year, so being vaccinated once is not enough.  Pneumococcal polysaccharide. You need 1 to 2 doses if you smoke cigarettes or if you have certain chronic medical conditions. You need 1 dose at age 65 (or older) if you have never been vaccinated.  Tetanus, diphtheria, pertussis (Tdap, Td). Get 1 dose of Tdap vaccine if you are younger than age 65, are over 65 and have contact with an infant, are a healthcare worker, are pregnant, or simply want to be protected from whooping cough. After that, you need a Td   booster dose every 10 years. Consult your caregiver if you have not had at least 3 tetanus and diphtheria-containing shots sometime in your life or have a deep or dirty wound.  HPV. You need this vaccine if you are a woman age 26 or younger. The vaccine is given in 3 doses over 6 months.  Measles, mumps, rubella (MMR). You need at least 1 dose of MMR if you were born in 1957 or later. You may also need a second dose.  Meningococcal. If you are age 19 to 21 and a first-year college student living in a residence hall, or have one of several medical conditions, you need to get vaccinated against meningococcal disease. You may also need additional booster doses.  Zoster (shingles). If you are age 60 or older, you should get this vaccine.  Varicella (chickenpox). If you have never had chickenpox or you were vaccinated but received only 1 dose, talk to your caregiver to find out if you need this vaccine.  Hepatitis A. You need this vaccine if you have a specific risk factor for hepatitis A virus infection or you simply wish to be protected from this disease. The vaccine is usually given as 2 doses, 6 to 18 months apart.  Hepatitis B. You need this vaccine if you have a specific risk factor for hepatitis B virus infection or you simply wish to be protected from this disease. The vaccine is given in 3 doses, usually over 6 months. Preventive Services / Frequency Ages 19 to 39  Blood  pressure check.** / Every 1 to 2 years.  Lipid and cholesterol check.** / Every 5 years beginning at age 20.  Clinical breast exam.** / Every 3 years for women in their 20s and 30s.  Pap test.** / Every 2 years from ages 21 through 29. Every 3 years starting at age 30 through age 65 or 70 with a history of 3 consecutive normal Pap tests.  HPV screening.** / Every 3 years from ages 30 through ages 65 to 70 with a history of 3 consecutive normal Pap tests.  Hepatitis C blood test.** / For any individual with known risks for hepatitis C.  Skin self-exam. / Monthly.  Influenza immunization.** / Every year.  Pneumococcal polysaccharide immunization.** / 1 to 2 doses if you smoke cigarettes or if you have certain chronic medical conditions.  Tetanus, diphtheria, pertussis (Tdap, Td) immunization. / A one-time dose of Tdap vaccine. After that, you need a Td booster dose every 10 years.  HPV immunization. / 3 doses over 6 months, if you are 26 and younger.  Measles, mumps, rubella (MMR) immunization. / You need at least 1 dose of MMR if you were born in 1957 or later. You may also need a second dose.  Meningococcal immunization. / 1 dose if you are age 19 to 21 and a first-year college student living in a residence hall, or have one of several medical conditions, you need to get vaccinated against meningococcal disease. You may also need additional booster doses.  Varicella immunization.** / Consult your caregiver.  Hepatitis A immunization.** / Consult your caregiver. 2 doses, 6 to 18 months apart.  Hepatitis B immunization.** / Consult your caregiver. 3 doses usually over 6 months. Ages 40 to 64  Blood pressure check.** / Every 1 to 2 years.  Lipid and cholesterol check.** / Every 5 years beginning at age 20.  Clinical breast exam.** / Every year after age 40.  Mammogram.** / Every year beginning at age 40   and continuing for as long as you are in good health. Consult with your  caregiver.  Pap test.** / Every 3 years starting at age 30 through age 65 or 70 with a history of 3 consecutive normal Pap tests.  HPV screening.** / Every 3 years from ages 30 through ages 65 to 70 with a history of 3 consecutive normal Pap tests.  Fecal occult blood test (FOBT) of stool. / Every year beginning at age 50 and continuing until age 75. You may not need to do this test if you get a colonoscopy every 10 years.  Flexible sigmoidoscopy or colonoscopy.** / Every 5 years for a flexible sigmoidoscopy or every 10 years for a colonoscopy beginning at age 50 and continuing until age 75.  Hepatitis C blood test.** / For all people born from 1945 through 1965 and any individual with known risks for hepatitis C.  Skin self-exam. / Monthly.  Influenza immunization.** / Every year.  Pneumococcal polysaccharide immunization.** / 1 to 2 doses if you smoke cigarettes or if you have certain chronic medical conditions.  Tetanus, diphtheria, pertussis (Tdap, Td) immunization.** / A one-time dose of Tdap vaccine. After that, you need a Td booster dose every 10 years.  Measles, mumps, rubella (MMR) immunization. / You need at least 1 dose of MMR if you were born in 1957 or later. You may also need a second dose.  Varicella immunization.** / Consult your caregiver.  Meningococcal immunization.** / Consult your caregiver.  Hepatitis A immunization.** / Consult your caregiver. 2 doses, 6 to 18 months apart.  Hepatitis B immunization.** / Consult your caregiver. 3 doses, usually over 6 months. Ages 65 and over  Blood pressure check.** / Every 1 to 2 years.  Lipid and cholesterol check.** / Every 5 years beginning at age 20.  Clinical breast exam.** / Every year after age 40.  Mammogram.** / Every year beginning at age 40 and continuing for as long as you are in good health. Consult with your caregiver.  Pap test.** / Every 3 years starting at age 30 through age 65 or 70 with a 3  consecutive normal Pap tests. Testing can be stopped between 65 and 70 with 3 consecutive normal Pap tests and no abnormal Pap or HPV tests in the past 10 years.  HPV screening.** / Every 3 years from ages 30 through ages 65 or 70 with a history of 3 consecutive normal Pap tests. Testing can be stopped between 65 and 70 with 3 consecutive normal Pap tests and no abnormal Pap or HPV tests in the past 10 years.  Fecal occult blood test (FOBT) of stool. / Every year beginning at age 50 and continuing until age 75. You may not need to do this test if you get a colonoscopy every 10 years.  Flexible sigmoidoscopy or colonoscopy.** / Every 5 years for a flexible sigmoidoscopy or every 10 years for a colonoscopy beginning at age 50 and continuing until age 75.  Hepatitis C blood test.** / For all people born from 1945 through 1965 and any individual with known risks for hepatitis C.  Osteoporosis screening.** / A one-time screening for women ages 65 and over and women at risk for fractures or osteoporosis.  Skin self-exam. / Monthly.  Influenza immunization.** / Every year.  Pneumococcal polysaccharide immunization.** / 1 dose at age 65 (or older) if you have never been vaccinated.  Tetanus, diphtheria, pertussis (Tdap, Td) immunization. / A one-time dose of Tdap vaccine if you are over   65 and have contact with an infant, are a healthcare worker, or simply want to be protected from whooping cough. After that, you need a Td booster dose every 10 years.  Varicella immunization.** / Consult your caregiver.  Meningococcal immunization.** / Consult your caregiver.  Hepatitis A immunization.** / Consult your caregiver. 2 doses, 6 to 18 months apart.  Hepatitis B immunization.** / Check with your caregiver. 3 doses, usually over 6 months. ** Family history and personal history of risk and conditions may change your caregiver's recommendations. Document Released: 08/25/2001 Document Revised: 09/21/2011  Document Reviewed: 11/24/2010 [employer] Patient Information 2014 [employer]  

## 2013-03-06 NOTE — Assessment & Plan Note (Signed)
STABLE CON'T MEDS

## 2013-03-06 NOTE — Assessment & Plan Note (Signed)
Stable , con't meds  

## 2013-03-06 NOTE — Progress Notes (Signed)
Subjective:    Helen Wilson is a 79 y.o. female who presents for Medicare Annual/Subsequent preventive examination.  Preventive Screening-Counseling & Management  Tobacco History  Smoking status  . Never Smoker   Smokeless tobacco  . Never Used     Problems Prior to Visit 1. none  Current Problems (verified) Patient Active Problem List   Diagnosis Date Noted  . Hemorrhoids, internal, with complication 04/08/2011  . Hair loss 01/15/2011  . OSTEOARTHRITIS 09/04/2010  . NONSPECIFIC ABNORM RESULTS KIDNEY FUNCTION STUDY 09/01/2010  . HYPERLIPIDEMIA 07/30/2010  . HYPERTENSION 07/30/2010  . ALLERGIC ARTHRITIS OTHER SPECIFIED SITES 07/30/2010  . HIP PAIN, BILATERAL 07/30/2010  . OSTEOPENIA 07/30/2010    Medications Prior to Visit Current Outpatient Prescriptions on File Prior to Visit  Medication Sig Dispense Refill  . aspirin 81 MG tablet Take 81 mg by mouth daily.        . Calcium Carbonate (CALCIUM 600) 1500 MG TABS Take 1 tablet by mouth 2 (two) times daily.        . Calcium Carbonate-Vitamin D (CALCIUM-VITAMIN D) 600-200 MG-UNIT CAPS Take 2 tablets by mouth daily.        . fish oil-omega-3 fatty acids 1000 MG capsule Take 2 g by mouth daily.        . Multiple Vitamin (MULTIVITAMIN) tablet Take 1 tablet by mouth daily.        . Multiple Vitamins-Minerals (SENIOR MULTIVITAMIN PLUS) TABS Take 1 tablet by mouth daily.        . sotalol (BETAPACE) 80 MG tablet Take 80 mg by mouth 2 (two) times daily.         No current facility-administered medications on file prior to visit.    Current Medications (verified) Current Outpatient Prescriptions  Medication Sig Dispense Refill  . aspirin 81 MG tablet Take 81 mg by mouth daily.        . Calcium Carbonate (CALCIUM 600) 1500 MG TABS Take 1 tablet by mouth 2 (two) times daily.        . Calcium Carbonate-Vitamin D (CALCIUM-VITAMIN D) 600-200 MG-UNIT CAPS Take 2 tablets by mouth daily.        . fish oil-omega-3 fatty acids 1000 MG  capsule Take 2 g by mouth daily.        Marland Kitchen lisinopril (PRINIVIL,ZESTRIL) 20 MG tablet Take 1 tablet (20 mg total) by mouth daily.  90 tablet  3  . meloxicam (MOBIC) 7.5 MG tablet TAKE ONE TABLET BY MOUTH EVERY DAY AS NEEDED  90 tablet  3  . Multiple Vitamin (MULTIVITAMIN) tablet Take 1 tablet by mouth daily.        . Multiple Vitamins-Minerals (SENIOR MULTIVITAMIN PLUS) TABS Take 1 tablet by mouth daily.        . sotalol (BETAPACE) 80 MG tablet Take 80 mg by mouth 2 (two) times daily.         No current facility-administered medications for this visit.     Allergies (verified) Nifedipine   PAST HISTORY  Family History Family History  Problem Relation Age of Onset  . Arthritis    . Heart disease    . Parkinsonism Mother   . Rheum arthritis Mother   . Diabetes      Social History History  Substance Use Topics  . Smoking status: Never Smoker   . Smokeless tobacco: Never Used  . Alcohol Use: No     Are there smokers in your home (other than you)? No  Risk Factors Current exercise habits: Gym/  health club routine includes cardio and swimming.  Dietary issues discussed: na   Cardiac risk factors: advanced age (older than 82 for men, 19 for women), hypertension and obesity (BMI >= 30 kg/m2).  Depression Screen (Note: if answer to either of the following is "Yes", a more complete depression screening is indicated)   Over the past two weeks, have you felt down, depressed or hopeless? Yes  Over the past two weeks, have you felt little interest or pleasure in doing things? No  Have you lost interest or pleasure in daily life? No  Do you often feel hopeless? No  Do you cry easily over simple problems? No  Activities of Daily Living In your present state of health, do you have any difficulty performing the following activities?:  Driving? No Managing money?  No Feeding yourself? No Getting from bed to chair? No Climbing a flight of stairs? No Preparing food and eating?:  No Bathing or showering? No Getting dressed: No Getting to the toilet? No Using the toilet:No Moving around from place to place: No In the past year have you fallen or had a near fall?:No   Are you sexually active?  No  Do you have more than one partner?  No  Hearing Difficulties: No Do you often ask people to speak up or repeat themselves? No Do you experience ringing or noises in your ears? No Do you have difficulty understanding soft or whispered voices? No   Do you feel that you have a problem with memory? No  Do you often misplace items? No  Do you feel safe at home?  Yes  Cognitive Testing  Alert? Yes  Normal Appearance?Yes  Oriented to person? Yes  Place? Yes   Time? Yes  Recall of three objects?  Yes  Can perform simple calculations? Yes  Displays appropriate judgment?Yes  Can read the correct time from a watch face?Yes   Advanced Directives have been discussed with the patient? Yes  List the Names of Other Physician/Practitioners you currently use: 1.    Indicate any recent Medical Services you may have received from other than [facility] providers in the past year (date may be approximate).  Immunization History  Administered Date(s) Administered  . Pneumococcal Polysaccharide 07/30/2010    Screening Tests Health Maintenance  Topic Date Due  . Tetanus/tdap  02/25/1953  . Colonoscopy  02/26/1984  . Zostavax  02/25/1994  . Influenza Vaccine  03/13/2013  . Pneumococcal Polysaccharide Vaccine Age 5 And Over  Completed    All answers were reviewed with the patient and necessary referrals were made:  Loreen Freud, DO   03/06/2013   History reviewed:  She  has a past medical history of Hyperlipidemia; Hypertension; Arthritis; and Hemorrhoids. She  does not have any pertinent problems on file. She  has past surgical history that includes Abdominal hysterectomy and Breast biopsy. Her family history includes Arthritis in an other family member; Diabetes in an  other family member; Heart disease in an other family member; Parkinsonism in her mother; Rheum arthritis in her mother. She  reports that she has never smoked. She has never used smokeless tobacco. She reports that she does not drink alcohol or use illicit drugs. She has a current medication list which includes the following prescription(s): aspirin, calcium carbonate, calcium-vitamin d, fish oil-omega-3 fatty acids, lisinopril, meloxicam, multivitamin, senior multivitamin plus, and sotalol. Current Outpatient Prescriptions on File Prior to Visit  Medication Sig Dispense Refill  . aspirin 81 MG tablet Take 81 mg  by mouth daily.        . Calcium Carbonate (CALCIUM 600) 1500 MG TABS Take 1 tablet by mouth 2 (two) times daily.        . Calcium Carbonate-Vitamin D (CALCIUM-VITAMIN D) 600-200 MG-UNIT CAPS Take 2 tablets by mouth daily.        . fish oil-omega-3 fatty acids 1000 MG capsule Take 2 g by mouth daily.        . Multiple Vitamin (MULTIVITAMIN) tablet Take 1 tablet by mouth daily.        . Multiple Vitamins-Minerals (SENIOR MULTIVITAMIN PLUS) TABS Take 1 tablet by mouth daily.        . sotalol (BETAPACE) 80 MG tablet Take 80 mg by mouth 2 (two) times daily.         No current facility-administered medications on file prior to visit.   She is allergic to nifedipine.  Review of Systems Review of Systems  Constitutional: Negative for activity change, appetite change and fatigue.  HENT: Negative for hearing loss, congestion, tinnitus and ear discharge.  dentist q23m Eyes: Negative for visual disturbance (see optho q1y -- vision corrected to 20/20 with glasses).  Respiratory: Negative for cough, chest tightness and shortness of breath.   Cardiovascular: Negative for chest pain, palpitations and leg swelling.  Gastrointestinal: Negative for abdominal pain, diarrhea, constipation and abdominal distention.  Genitourinary: Negative for urgency, frequency, decreased urine volume and difficulty  urinating.  Musculoskeletal: Negative for back pain, arthralgias and gait problem.  Skin: Negative for color change, pallor and rash.  Neurological: Negative for dizziness, light-headedness, numbness and headaches.  Hematological: Negative for adenopathy. Does not bruise/bleed easily.  Psychiatric/Behavioral: Negative for suicidal ideas, confusion, sleep disturbance, self-injury, dysphoric mood, decreased concentration and agitation.        Objective:     Vision by Snellen chart: opth  Body mass index is 34.66 kg/(m^2). BP 132/80  Pulse 56  Temp(Src) 98.6 F (37 C) (Oral)  Ht 5\' 3"  (1.6 m)  Wt 195 lb 9.6 oz (88.724 kg)  BMI 34.66 kg/m2  SpO2 96%  BP 132/80  Pulse 56  Temp(Src) 98.6 F (37 C) (Oral)  Ht 5\' 3"  (1.6 m)  Wt 195 lb 9.6 oz (88.724 kg)  BMI 34.66 kg/m2  SpO2 96% General appearance: alert, cooperative, appears stated age and no distress Head: Normocephalic, without obvious abnormality, atraumatic Eyes: conjunctivae/corneas clear. PERRL, EOM's intact. Fundi benign. Ears: normal TM's and external ear canals both ears Nose: Nares normal. Septum midline. Mucosa normal. No drainage or sinus tenderness. Throat: lips, mucosa, and tongue normal; teeth and gums normal Neck: no adenopathy, no carotid bruit, no JVD, supple, symmetrical, trachea midline and thyroid not enlarged, symmetric, no tenderness/mass/nodules Back: symmetric, no curvature. ROM normal. No CVA tenderness. Lungs: clear to auscultation bilaterally Breasts: normal appearance, no masses or tenderness Heart: regular rate and rhythm, S1, S2 normal, no murmur, click, rub or gallop Abdomen: soft, non-tender; bowel sounds normal; no masses,  no organomegaly Pelvic: deferred Extremities: extremities normal, atraumatic, no cyanosis or edema Pulses: 2+ and symmetric Skin: Skin color, texture, turgor normal. No rashes or lesions Lymph nodes: Cervical, supraclavicular, and axillary nodes normal. Neurologic:  Alert and oriented X 3, normal strength and tone. Normal symmetric reflexes. Normal coordination and gait Psych-- no anxiety, no depression      Assessment:     cpe     Plan:     During the course of the visit the patient was educated and counseled about appropriate screening  and preventive services including:    Pneumococcal vaccine   Td vaccine  Screening electrocardiogram  Screening mammography  Screening Pap smear and pelvic exam   Bone densitometry screening  Colorectal cancer screening  Diabetes screening  Glaucoma screening  Advanced directives: has an advanced directive - a copy HAS NOT been provided.  Diet review for nutrition referral? Yes ____  Not Indicated __z_   Patient Instructions (the written plan) was given to the patient.  Medicare Attestation I have personally reviewed: The patient's medical and social history Their use of alcohol, tobacco or illicit drugs Their current medications and supplements The patient's functional ability including ADLs,fall risks, home safety risks, cognitive, and hearing and visual impairment Diet and physical activities Evidence for depression or mood disorders  The patient's weight, height, BMI, and visual acuity have been recorded in the chart.  I have made referrals, counseling, and provided education to the patient based on review of the above and I have provided the patient with a written personalized care plan for preventive services.     Loreen Freud, DO   03/06/2013

## 2013-03-08 LAB — URINE CULTURE: Colony Count: 30000

## 2013-03-09 DIAGNOSIS — H353290 Exudative age-related macular degeneration, unspecified eye, stage unspecified: Secondary | ICD-10-CM | POA: Diagnosis not present

## 2013-03-09 DIAGNOSIS — H269 Unspecified cataract: Secondary | ICD-10-CM | POA: Diagnosis not present

## 2013-03-09 DIAGNOSIS — H3530 Unspecified macular degeneration: Secondary | ICD-10-CM | POA: Diagnosis not present

## 2013-03-09 DIAGNOSIS — H43819 Vitreous degeneration, unspecified eye: Secondary | ICD-10-CM | POA: Diagnosis not present

## 2013-03-16 ENCOUNTER — Telehealth: Payer: Self-pay

## 2013-03-16 ENCOUNTER — Other Ambulatory Visit: Payer: Self-pay

## 2013-03-16 ENCOUNTER — Telehealth: Payer: Self-pay | Admitting: Family Medicine

## 2013-03-16 MED ORDER — SIMVASTATIN 20 MG PO TABS: 20.0000 mg | ORAL_TABLET | Freq: Every day | ORAL | Status: DC

## 2013-03-16 NOTE — Telephone Encounter (Signed)
Spoke with patient and reviewed labs. Explained information per DO. Patient understands what she should do and the new medication regime.

## 2013-03-16 NOTE — Telephone Encounter (Signed)
Opened in error. BC °

## 2013-03-30 DIAGNOSIS — L723 Sebaceous cyst: Secondary | ICD-10-CM | POA: Diagnosis not present

## 2013-03-30 DIAGNOSIS — L570 Actinic keratosis: Secondary | ICD-10-CM | POA: Diagnosis not present

## 2013-03-30 DIAGNOSIS — L820 Inflamed seborrheic keratosis: Secondary | ICD-10-CM | POA: Diagnosis not present

## 2013-04-20 DIAGNOSIS — H43399 Other vitreous opacities, unspecified eye: Secondary | ICD-10-CM | POA: Diagnosis not present

## 2013-04-20 DIAGNOSIS — H353290 Exudative age-related macular degeneration, unspecified eye, stage unspecified: Secondary | ICD-10-CM | POA: Diagnosis not present

## 2013-04-20 DIAGNOSIS — H3530 Unspecified macular degeneration: Secondary | ICD-10-CM | POA: Diagnosis not present

## 2013-04-20 DIAGNOSIS — H43819 Vitreous degeneration, unspecified eye: Secondary | ICD-10-CM | POA: Diagnosis not present

## 2013-04-27 DIAGNOSIS — L723 Sebaceous cyst: Secondary | ICD-10-CM | POA: Diagnosis not present

## 2013-04-27 DIAGNOSIS — L820 Inflamed seborrheic keratosis: Secondary | ICD-10-CM | POA: Diagnosis not present

## 2013-05-13 ENCOUNTER — Other Ambulatory Visit: Payer: Self-pay | Admitting: Family Medicine

## 2013-05-25 DIAGNOSIS — H353190 Nonexudative age-related macular degeneration, unspecified eye, stage unspecified: Secondary | ICD-10-CM | POA: Diagnosis not present

## 2013-05-25 DIAGNOSIS — H269 Unspecified cataract: Secondary | ICD-10-CM | POA: Diagnosis not present

## 2013-05-25 DIAGNOSIS — H43399 Other vitreous opacities, unspecified eye: Secondary | ICD-10-CM | POA: Diagnosis not present

## 2013-05-25 DIAGNOSIS — H353290 Exudative age-related macular degeneration, unspecified eye, stage unspecified: Secondary | ICD-10-CM | POA: Diagnosis not present

## 2013-06-20 DIAGNOSIS — E785 Hyperlipidemia, unspecified: Secondary | ICD-10-CM | POA: Diagnosis not present

## 2013-06-20 DIAGNOSIS — E538 Deficiency of other specified B group vitamins: Secondary | ICD-10-CM | POA: Diagnosis not present

## 2013-06-20 DIAGNOSIS — I4891 Unspecified atrial fibrillation: Secondary | ICD-10-CM | POA: Diagnosis not present

## 2013-06-29 DIAGNOSIS — H353290 Exudative age-related macular degeneration, unspecified eye, stage unspecified: Secondary | ICD-10-CM | POA: Diagnosis not present

## 2013-06-29 DIAGNOSIS — H353190 Nonexudative age-related macular degeneration, unspecified eye, stage unspecified: Secondary | ICD-10-CM | POA: Diagnosis not present

## 2013-06-29 DIAGNOSIS — H269 Unspecified cataract: Secondary | ICD-10-CM | POA: Diagnosis not present

## 2013-06-29 DIAGNOSIS — H43819 Vitreous degeneration, unspecified eye: Secondary | ICD-10-CM | POA: Diagnosis not present

## 2013-08-02 ENCOUNTER — Ambulatory Visit (INDEPENDENT_AMBULATORY_CARE_PROVIDER_SITE_OTHER): Payer: Medicare Other | Admitting: Surgery

## 2013-08-02 ENCOUNTER — Encounter (INDEPENDENT_AMBULATORY_CARE_PROVIDER_SITE_OTHER): Payer: Self-pay | Admitting: Surgery

## 2013-08-02 ENCOUNTER — Encounter (INDEPENDENT_AMBULATORY_CARE_PROVIDER_SITE_OTHER): Payer: Self-pay

## 2013-08-02 VITALS — BP 152/90 | HR 60 | Temp 97.2°F | Resp 16 | Ht 63.0 in | Wt 197.6 lb

## 2013-08-02 DIAGNOSIS — K648 Other hemorrhoids: Secondary | ICD-10-CM | POA: Diagnosis not present

## 2013-08-02 MED ORDER — HYDROCORTISONE ACETATE 25 MG RE SUPP: 25.0000 mg | Freq: Two times a day (BID) | RECTAL | Status: DC

## 2013-08-02 MED ORDER — HYDROCORTISONE ACE-PRAMOXINE 2.5-1 % RE CREA: 1.0000 "application " | TOPICAL_CREAM | Freq: Three times a day (TID) | RECTAL | Status: DC

## 2013-08-02 NOTE — Progress Notes (Signed)
General Surgery [facility]  Chief Complaint  Patient presents with  . Hemorrhoids    follow up at patient request    HISTORY: Patient is a 80 year old female known to my practice for management of internal hemorrhoids over several years. Patient has never required operative intervention. She was last seen 2 and half years ago.  Patient notes recent increased prolapse and swelling. She denies any significant bleeding. She denies any significant pain. She is using baby wipes.  PERTINENT REVIEW OF SYSTEMS: No significant bleeding. No significant pain. Moderate prolapse.  EXAM: HEENT: normocephalic; pupils equal and reactive; sclerae clear; dentition good; mucous membranes moist NECK:  symmetric on extension; no palpable anterior or posterior cervical lymphadenopathy; no supraclavicular masses; no tenderness CHEST: clear to auscultation bilaterally without rales, rhonchi, or wheezes CARDIAC: regular rate and rhythm without significant murmur; peripheral pulses are full GU/RECTAL: External examination shows multiple benign skin tags; significant prolapse anteriorly with superficial vessels which are friable; no sign of thrombosis; anoscopy is performed and there are grade 2-3 internal hemorrhoids with prolapse in the anterior column; rubber band ligation is performed on the anterior column with a small amount of bleeding at the time of rubber band application; procedure is well tolerated.  IMPRESSION: Grade 3 internal hemorrhoids with prolapse and bleeding  PLAN: Patient and I discussed these findings. At this point she is not tremendously symptomatic and does not wish operative intervention. Hopefully the rubber band ligation will result in some scar formation and improved the level of prolapse she is experiencing. I am also going to start her on Anusol HC suppositories and topical Analpram cream for her symptoms.  Patient will return for evaluation as needed. If  prolapse remains a problem, I may ask her to be seen by my partner, Dr. Leighton Ruff, who is a colorectal specialist.  Earnstine Regal, MD, [facility] Office: [phone]  Visit Diagnoses: 1. Hemorrhoids, internal, with bleeding

## 2013-08-02 NOTE — Patient Instructions (Signed)
ANORECTAL PROCEDURES: 1.  Tub soaks 2-3 times daily in warm water (may add Epsom salts if desired) 2.  Stool softener for one month (store brand Miralax or Colace) 3.  Avoid toilet paper - use baby wipes or Tucks pads 4.  Increase water intake - 6-8 glasses daily 5.  Apply dry pad to area until drainage stops 

## 2013-08-03 DIAGNOSIS — H3530 Unspecified macular degeneration: Secondary | ICD-10-CM | POA: Diagnosis not present

## 2013-08-03 DIAGNOSIS — H43819 Vitreous degeneration, unspecified eye: Secondary | ICD-10-CM | POA: Diagnosis not present

## 2013-08-03 DIAGNOSIS — H269 Unspecified cataract: Secondary | ICD-10-CM | POA: Diagnosis not present

## 2013-08-03 DIAGNOSIS — H353290 Exudative age-related macular degeneration, unspecified eye, stage unspecified: Secondary | ICD-10-CM | POA: Diagnosis not present

## 2013-08-07 ENCOUNTER — Telehealth: Payer: Self-pay | Admitting: *Deleted

## 2013-08-07 MED ORDER — SIMVASTATIN 20 MG PO TABS: ORAL_TABLET | ORAL | Status: DC

## 2013-08-07 NOTE — Telephone Encounter (Signed)
08/07/2013 Pt came by this morning requesting refill on simvastatin (ZOCOR) 20 MG tablet  Last OV 03/06/13 Last refill 05/15/2013, #90, 1 refill  Please send to [facility] on [address].

## 2013-08-07 NOTE — Telephone Encounter (Signed)
Rx faxed.    KP 

## 2013-08-09 DIAGNOSIS — L570 Actinic keratosis: Secondary | ICD-10-CM | POA: Diagnosis not present

## 2013-08-09 DIAGNOSIS — L820 Inflamed seborrheic keratosis: Secondary | ICD-10-CM | POA: Diagnosis not present

## 2013-08-09 DIAGNOSIS — L259 Unspecified contact dermatitis, unspecified cause: Secondary | ICD-10-CM | POA: Diagnosis not present

## 2013-09-11 DIAGNOSIS — H43399 Other vitreous opacities, unspecified eye: Secondary | ICD-10-CM | POA: Diagnosis not present

## 2013-09-11 DIAGNOSIS — H43819 Vitreous degeneration, unspecified eye: Secondary | ICD-10-CM | POA: Diagnosis not present

## 2013-09-11 DIAGNOSIS — H3530 Unspecified macular degeneration: Secondary | ICD-10-CM | POA: Diagnosis not present

## 2013-09-11 DIAGNOSIS — H353290 Exudative age-related macular degeneration, unspecified eye, stage unspecified: Secondary | ICD-10-CM | POA: Diagnosis not present

## 2013-10-18 DIAGNOSIS — M76899 Other specified enthesopathies of unspecified lower limb, excluding foot: Secondary | ICD-10-CM | POA: Diagnosis not present

## 2013-10-23 DIAGNOSIS — H353290 Exudative age-related macular degeneration, unspecified eye, stage unspecified: Secondary | ICD-10-CM | POA: Diagnosis not present

## 2013-12-14 DIAGNOSIS — H353190 Nonexudative age-related macular degeneration, unspecified eye, stage unspecified: Secondary | ICD-10-CM | POA: Diagnosis not present

## 2013-12-14 DIAGNOSIS — H3581 Retinal edema: Secondary | ICD-10-CM | POA: Diagnosis not present

## 2013-12-14 DIAGNOSIS — H259 Unspecified age-related cataract: Secondary | ICD-10-CM | POA: Diagnosis not present

## 2013-12-14 DIAGNOSIS — H353290 Exudative age-related macular degeneration, unspecified eye, stage unspecified: Secondary | ICD-10-CM | POA: Diagnosis not present

## 2014-01-29 ENCOUNTER — Telehealth: Payer: Self-pay | Admitting: Family Medicine

## 2014-01-29 NOTE — Telephone Encounter (Signed)
Spoke with patient who states that she has burning when urinating, feels the urge to urinate but cannot and is painful. Per Dr Birdie Riddle ok to double book 10:30am appt. Advised patient that she would be a double book so she may have to wait a little while. States ok. Advised to drink water and cranberry juice. Ok to take tylenol for pain.

## 2014-01-29 NOTE — Telephone Encounter (Signed)
Caller name: Nylani  Call back number:[phone]   Reason for call:  Pt called in stating she had a UTI and was in severe pain.  We did not have any openings at our location and patient only wants to come to [facility] location.  Does not want to go to [facility].  Tried to to connect with CAN, routed to office RN instead.

## 2014-01-30 ENCOUNTER — Encounter: Payer: Self-pay | Admitting: Family Medicine

## 2014-01-30 ENCOUNTER — Ambulatory Visit (INDEPENDENT_AMBULATORY_CARE_PROVIDER_SITE_OTHER): Payer: Medicare Other | Admitting: Family Medicine

## 2014-01-30 VITALS — BP 130/78 | HR 62 | Temp 98.0°F | Resp 16 | Wt 196.1 lb

## 2014-01-30 DIAGNOSIS — N3000 Acute cystitis without hematuria: Secondary | ICD-10-CM

## 2014-01-30 DIAGNOSIS — N3001 Acute cystitis with hematuria: Secondary | ICD-10-CM

## 2014-01-30 DIAGNOSIS — R319 Hematuria, unspecified: Secondary | ICD-10-CM

## 2014-01-30 DIAGNOSIS — R82998 Other abnormal findings in urine: Secondary | ICD-10-CM | POA: Diagnosis not present

## 2014-01-30 DIAGNOSIS — R300 Dysuria: Secondary | ICD-10-CM

## 2014-01-30 DIAGNOSIS — N390 Urinary tract infection, site not specified: Secondary | ICD-10-CM | POA: Insufficient documentation

## 2014-01-30 LAB — POCT URINALYSIS DIPSTICK
Bilirubin, UA: NEGATIVE
Glucose, UA: NEGATIVE
Ketones, UA: NEGATIVE
Nitrite, UA: NEGATIVE
Spec Grav, UA: 1.01
Urobilinogen, UA: 0.2
pH, UA: 6.5

## 2014-01-30 MED ORDER — CEPHALEXIN 500 MG PO CAPS: 500.0000 mg | ORAL_CAPSULE | Freq: Two times a day (BID) | ORAL | Status: DC

## 2014-01-30 NOTE — Progress Notes (Signed)
Pre visit review using our clinic review tool, if applicable. No additional management support is needed unless otherwise documented below in the visit note. 

## 2014-01-30 NOTE — Progress Notes (Signed)
   Subjective:    Patient ID: Lynesha Bango, female    DOB: [birth year], 80 y.o.   MRN: [id number]  Dysuria    UTI- sxs started 6 days ago w/ pelvic pain.  Pt thought it was initially constipation.  Then yesterday pt developed severe burning w/ urination, knife like suprapubic pain, urinary hesitancy.  Increased frequency.  No fevers.  No back pain.   Review of Systems  Genitourinary: Positive for dysuria.   For ROS see HPI     Objective:   Physical Exam  Vitals reviewed. Constitutional: She is oriented to person, place, and time. She appears well-developed and well-nourished. No distress.  Abdominal: Soft. She exhibits no distension. There is no tenderness (no suprapubic or CVA tenderness).  Neurological: She is alert and oriented to person, place, and time.  Psychiatric: She has a normal mood and affect. Her behavior is normal.          Assessment & Plan:

## 2014-01-30 NOTE — Assessment & Plan Note (Signed)
Pt's sxs and UA consistent w/ infxn.  Start abx.  Encouraged increased water intake.  Reviewed supportive care and red flags that should prompt return.  Pt expressed understanding and is in agreement w/ plan.

## 2014-01-30 NOTE — Patient Instructions (Signed)
Follow up as needed Continue to drink plenty of fluids Start the keflex twice daily for the infection Call with any questions or concerns Hang in there!!! Happy Early Rudene Anda!!!

## 2014-02-01 LAB — URINE CULTURE: Colony Count: 75000

## 2014-02-05 DIAGNOSIS — H353290 Exudative age-related macular degeneration, unspecified eye, stage unspecified: Secondary | ICD-10-CM | POA: Diagnosis not present

## 2014-02-05 DIAGNOSIS — H353190 Nonexudative age-related macular degeneration, unspecified eye, stage unspecified: Secondary | ICD-10-CM | POA: Diagnosis not present

## 2014-02-06 ENCOUNTER — Ambulatory Visit (INDEPENDENT_AMBULATORY_CARE_PROVIDER_SITE_OTHER): Payer: Medicare Other | Admitting: Family Medicine

## 2014-02-06 ENCOUNTER — Encounter: Payer: Self-pay | Admitting: Family Medicine

## 2014-02-06 VITALS — BP 138/72 | HR 59 | Temp 98.0°F | Wt 195.0 lb

## 2014-02-06 DIAGNOSIS — I10 Essential (primary) hypertension: Secondary | ICD-10-CM

## 2014-02-06 DIAGNOSIS — E785 Hyperlipidemia, unspecified: Secondary | ICD-10-CM

## 2014-02-06 DIAGNOSIS — M1990 Unspecified osteoarthritis, unspecified site: Secondary | ICD-10-CM | POA: Diagnosis not present

## 2014-02-06 DIAGNOSIS — N390 Urinary tract infection, site not specified: Secondary | ICD-10-CM

## 2014-02-06 LAB — POCT URINALYSIS DIPSTICK
Bilirubin, UA: NEGATIVE
Glucose, UA: NEGATIVE
Ketones, UA: NEGATIVE
Nitrite, UA: NEGATIVE
Spec Grav, UA: 1.01
Urobilinogen, UA: 0.2
pH, UA: 5

## 2014-02-06 MED ORDER — LISINOPRIL 20 MG PO TABS: 20.0000 mg | ORAL_TABLET | Freq: Every day | ORAL | Status: DC

## 2014-02-06 MED ORDER — MELOXICAM 7.5 MG PO TABS: ORAL_TABLET | ORAL | Status: DC

## 2014-02-06 MED ORDER — CIPROFLOXACIN HCL 500 MG PO TABS: 500.0000 mg | ORAL_TABLET | Freq: Two times a day (BID) | ORAL | Status: DC

## 2014-02-06 MED ORDER — SIMVASTATIN 20 MG PO TABS: ORAL_TABLET | ORAL | Status: DC

## 2014-02-06 NOTE — Patient Instructions (Addendum)

## 2014-02-06 NOTE — Progress Notes (Addendum)
  Subjective:    Patient here for follow-up of elevated blood pressure.  She is not exercising and is adherent to a low-salt diet.  Blood pressure is well controlled at home. Cardiac symptoms: none. Patient denies: chest pain, chest pressure/discomfort, claudication, dyspnea, exertional chest pressure/discomfort, fatigue, irregular heart beat, lower extremity edema, near-syncope, orthopnea, palpitations, paroxysmal nocturnal dyspnea, syncope and tachypnea. Cardiovascular risk factors: advanced age (older than 62 for men, 29 for women), dyslipidemia, hypertension, obesity (BMI >= 30 kg/m2) and sedentary lifestyle. Use of agents associated with hypertension: none. History of target organ damage: none.  Pt is also still c/o burning with urination and frequency.   Pt finished keflex.     The following portions of the patient's history were reviewed and updated as appropriate: allergies, current medications, past family history, past medical history, past social history, past surgical history and problem list.  Review of Systems Pertinent items are noted in HPI.     Objective:    BP 138/72  Pulse 59  Temp(Src) 98 F (36.7 C) (Oral)  Wt 195 lb (88.451 kg)  SpO2 95% General appearance: alert, cooperative, appears stated age and no distress Lungs: clear to auscultation bilaterally Heart: S1, S2 normal--+ murmur  Extremities: extremities normal, atraumatic, no cyanosis or edema    Assessment:    Hypertension, normal blood pressure . Evidence of target organ damage: none.    Plan:    Medication: no change. Dietary sodium restriction. Regular aerobic exercise. Check blood pressures 2-3 times weekly and record. Follow up: 3 months and as needed. ---- for cpe as scheduled   1. Urinary tract infection, site not specified Recheck urine - POCT Urinalysis Dipstick - Urine Culture - ciprofloxacin (CIPRO) 500 MG tablet; Take 1 tablet (500 mg total) by mouth 2 (two) times daily.  Dispense: 10  tablet; Refill: 0  2. Essential hypertension stable - Basic metabolic panel - Hepatic function panel - Lipid panel - lisinopril (PRINIVIL,ZESTRIL) 20 MG tablet; Take 1 tablet (20 mg total) by mouth daily.  Dispense: 90 tablet; Refill: 3  3. Other and unspecified hyperlipidemia Check labs  - Basic metabolic panel - Hepatic function panel - Lipid panel - simvastatin (ZOCOR) 20 MG tablet; TAKE ONE TABLET BY MOUTH AT BEDTIME  Dispense: 90 tablet; Refill: 1  4. OSTEOARTHRITIS  - meloxicam (MOBIC) 7.5 MG tablet; TAKE ONE TABLET BY MOUTH EVERY DAY AS NEEDED  Dispense: 90 tablet; Refill: 3

## 2014-02-09 ENCOUNTER — Telehealth: Payer: Self-pay | Admitting: Family Medicine

## 2014-02-09 DIAGNOSIS — N390 Urinary tract infection, site not specified: Secondary | ICD-10-CM

## 2014-02-09 MED ORDER — CIPROFLOXACIN HCL 500 MG PO TABS: 500.0000 mg | ORAL_TABLET | Freq: Two times a day (BID) | ORAL | Status: DC

## 2014-02-09 NOTE — Telephone Encounter (Signed)
Called patient made her aware that her urine culture results are still pending.  Pt was very upset and stated that she was told that she would hear back two days after her visit what her results were.  It was explained to the patient that it typically takes approximately 48 hours for the official results to come back and as of now her results were still pending.  Pt asked, "what am I suppose to do while I wait?"  She continues to experience burning upon urination, urinary frequency, and blood in her urine. Pt states that she is very uncomfortable and would like something prescribed.  Please advise.

## 2014-02-09 NOTE — Telephone Encounter (Signed)
As was explained at ov-- if she can not wait for culture we will call something in-- but culure takes a full 48 hours to come back---  cipro 500 mg 1 po bid x 5 days-- there is a small chance culture will come back resistant and we will have to change it but cipro covers uti most of the time.

## 2014-02-09 NOTE — Telephone Encounter (Signed)
Pt called back again.  Pt is upset that no one is calling her.

## 2014-02-09 NOTE — Telephone Encounter (Signed)
Caller name: Tyla Relation to pt: Call back number: [phone]   Reason for call:  Patient is wanting the results of her urinalysis.  Pt stated she was told she would get a call yesterday.  Pt wants a call today.

## 2014-02-09 NOTE — Telephone Encounter (Signed)
Dr. Nonda Lou note below was reviewed with patient.  Pt stated understanding, but was still slightly upset.  She agreed with Cipro.  Cipro Rx sent to [facility] on [address] per patient's request.  Pt is aware.  Pt asked that we call her early Monday morning with results.  Pt was told that we would call her once the results come back and have been resulted.  No further questions or concerns were voiced.

## 2014-02-10 LAB — URINE CULTURE: Colony Count: >=100000

## 2014-02-12 ENCOUNTER — Telehealth: Payer: Self-pay | Admitting: Family Medicine

## 2014-02-12 DIAGNOSIS — N390 Urinary tract infection, site not specified: Secondary | ICD-10-CM

## 2014-02-12 MED ORDER — CIPROFLOXACIN HCL 500 MG PO TABS: 500.0000 mg | ORAL_TABLET | Freq: Two times a day (BID) | ORAL | Status: AC

## 2014-02-12 NOTE — Telephone Encounter (Signed)
Called patient and made her aware of Dr. Nonda Lou recommendations.  Pt state understanding and agreed.  Rx sent to [facility] on [address].

## 2014-02-12 NOTE — Telephone Encounter (Signed)
cipro should treat it based on culture--- can can 5 more days in -- if burning con't after that we will need to repeat the urine

## 2014-02-12 NOTE — Telephone Encounter (Signed)
Caller name: Luwanna  Call back number:[phone]   Reason for call:   Pt states the she is still hurting and the medication is not working.  Still persistent that the lab results are needed.

## 2014-02-12 NOTE — Telephone Encounter (Signed)
Urine culture results were discussed with the patient.  Pt states urine does not have as much blood in it, but says that she continues to have burning on the sides of her vagina.  Denies burning at the urethral site.  - for discharge, itching or odor.  Pt states that she has two more pills left and wants to know what to do next.   Advice:  She was encouraged to try a warm saline bath with either 2 oz of table salt or baking soda.  Pt stated understanding and agreed to try.  Any other suggestions?  Please advise.

## 2014-02-21 DIAGNOSIS — L578 Other skin changes due to chronic exposure to nonionizing radiation: Secondary | ICD-10-CM | POA: Diagnosis not present

## 2014-03-06 DIAGNOSIS — M205X9 Other deformities of toe(s) (acquired), unspecified foot: Secondary | ICD-10-CM | POA: Diagnosis not present

## 2014-03-06 DIAGNOSIS — M2040 Other hammer toe(s) (acquired), unspecified foot: Secondary | ICD-10-CM | POA: Diagnosis not present

## 2014-03-09 DIAGNOSIS — M76899 Other specified enthesopathies of unspecified lower limb, excluding foot: Secondary | ICD-10-CM | POA: Diagnosis not present

## 2014-03-12 DIAGNOSIS — H353290 Exudative age-related macular degeneration, unspecified eye, stage unspecified: Secondary | ICD-10-CM | POA: Diagnosis not present

## 2014-03-22 ENCOUNTER — Encounter: Payer: Medicare Other | Admitting: Family Medicine

## 2014-03-23 ENCOUNTER — Other Ambulatory Visit (INDEPENDENT_AMBULATORY_CARE_PROVIDER_SITE_OTHER): Payer: Medicare Other

## 2014-03-23 DIAGNOSIS — R82998 Other abnormal findings in urine: Secondary | ICD-10-CM

## 2014-03-23 DIAGNOSIS — R8290 Unspecified abnormal findings in urine: Secondary | ICD-10-CM

## 2014-03-23 LAB — POCT URINALYSIS DIPSTICK
Bilirubin, UA: NEGATIVE
Blood, UA: NEGATIVE
Glucose, UA: NEGATIVE
Ketones, UA: NEGATIVE
Nitrite, UA: NEGATIVE
Protein, UA: NEGATIVE
Spec Grav, UA: 1.02
Urobilinogen, UA: 2
pH, UA: 6

## 2014-03-23 LAB — BASIC METABOLIC PANEL
BUN: 27 mg/dL — ABNORMAL HIGH (ref 6–23)
CO2: 26 mEq/L (ref 19–32)
Calcium: 9.2 mg/dL (ref 8.4–10.5)
Chloride: 111 mEq/L (ref 96–112)
Creatinine, Ser: 1.3 mg/dL — ABNORMAL HIGH (ref 0.4–1.2)
GFR: 42.26 mL/min — ABNORMAL LOW (ref >60.00)
Glucose, Bld: 97 mg/dL (ref 70–99)
Potassium: 4.3 mEq/L (ref 3.5–5.1)
Sodium: 142 mEq/L (ref 135–145)

## 2014-03-23 LAB — LIPID PANEL
Cholesterol: 160 mg/dL (ref 0–200)
HDL: 61.7 mg/dL (ref >39.00)
LDL Cholesterol: 82 mg/dL (ref 0–99)
NonHDL: 98.3
Total CHOL/HDL Ratio: 3
Triglycerides: 80 mg/dL (ref 0.0–149.0)
VLDL: 16 mg/dL (ref 0.0–40.0)

## 2014-03-23 LAB — HEPATIC FUNCTION PANEL
ALT: 15 U/L (ref 0–35)
AST: 21 U/L (ref 0–37)
Albumin: 3.7 g/dL (ref 3.5–5.2)
Alkaline Phosphatase: 63 U/L (ref 39–117)
Bilirubin, Direct: 0 mg/dL (ref 0.0–0.3)
Total Bilirubin: 0.8 mg/dL (ref 0.2–1.2)
Total Protein: 7.1 g/dL (ref 6.0–8.3)

## 2014-03-25 LAB — URINE CULTURE: Colony Count: 40000

## 2014-03-28 DIAGNOSIS — N289 Disorder of kidney and ureter, unspecified: Secondary | ICD-10-CM

## 2014-04-02 ENCOUNTER — Encounter: Payer: Self-pay | Admitting: Family Medicine

## 2014-04-02 ENCOUNTER — Ambulatory Visit (INDEPENDENT_AMBULATORY_CARE_PROVIDER_SITE_OTHER): Payer: Medicare Other | Admitting: Family Medicine

## 2014-04-02 VITALS — BP 162/76 | HR 61 | Temp 97.8°F | Wt 194.0 lb

## 2014-04-02 DIAGNOSIS — M7150 Other bursitis, not elsewhere classified, unspecified site: Secondary | ICD-10-CM

## 2014-04-02 DIAGNOSIS — N183 Chronic kidney disease, stage 3 unspecified: Secondary | ICD-10-CM | POA: Diagnosis not present

## 2014-04-02 DIAGNOSIS — I10 Essential (primary) hypertension: Secondary | ICD-10-CM

## 2014-04-02 DIAGNOSIS — R350 Frequency of micturition: Secondary | ICD-10-CM

## 2014-04-02 DIAGNOSIS — M719 Bursopathy, unspecified: Secondary | ICD-10-CM

## 2014-04-02 LAB — POCT URINALYSIS DIPSTICK
Bilirubin, UA: NEGATIVE
Blood, UA: NEGATIVE
Glucose, UA: NEGATIVE
Ketones, UA: NEGATIVE
Nitrite, UA: NEGATIVE
Protein, UA: NEGATIVE
Spec Grav, UA: 1.025
Urobilinogen, UA: 2
pH, UA: 6

## 2014-04-02 MED ORDER — AMLODIPINE BESYLATE 5 MG PO TABS: 5.0000 mg | ORAL_TABLET | Freq: Every day | ORAL | Status: DC

## 2014-04-02 MED ORDER — TRAMADOL HCL 50 MG PO TABS: 50.0000 mg | ORAL_TABLET | Freq: Three times a day (TID) | ORAL | Status: DC | PRN

## 2014-04-02 NOTE — Assessment & Plan Note (Signed)
D/c mobic an lisinopril Recheck 2 weeks

## 2014-04-02 NOTE — Progress Notes (Signed)
Pre visit review using our clinic review tool, if applicable. No additional management support is needed unless otherwise documented below in the visit note. 

## 2014-04-02 NOTE — Patient Instructions (Signed)
Chronic Kidney Disease °Chronic kidney disease occurs when the kidneys are damaged over a long period. The kidneys are two organs that lie on either side of the spine between the middle of the back and the front of the abdomen. The kidneys:  °· Remove wastes and extra water from the blood.   °· Produce important hormones. These help keep bones strong, regulate blood pressure, and help create red blood cells.   °· Balance the fluids and chemicals in the blood and tissues. °A small amount of kidney damage may not cause problems, but a large amount of damage may make it difficult or impossible for the kidneys to work the way they should. If steps are not taken to slow down the kidney damage or stop it from getting worse, the kidneys may stop working permanently. Most of the time, chronic kidney disease does not go away. However, it can often be controlled, and those with the disease can usually live normal lives. °CAUSES  °The most common causes of chronic kidney disease are diabetes and high blood pressure (hypertension). Chronic kidney disease may also be caused by:  °· Diseases that cause the kidneys' filters to become inflamed.   °· Diseases that affect the immune system.   °· Genetic diseases.   °· Medicines that damage the kidneys, such as anti-inflammatory medicines.   °· Poisoning or exposure to toxic substances.   °· A reoccurring kidney or urinary infection.   °· A problem with urine flow. This may be caused by:   °¨ Cancer.   °¨ Kidney stones.   °¨ An enlarged prostate in males. °SIGNS AND SYMPTOMS  °Because the kidney damage in chronic kidney disease occurs slowly, symptoms develop slowly and may not be obvious until the kidney damage becomes severe. A person may have a kidney disease for years without showing any symptoms. Symptoms can include:  °· Swelling (edema) of the legs, ankles, or feet.   °· Tiredness (lethargy).   °· Nausea or vomiting.   °· Confusion.   °· Problems with urination, such as:    °¨ Decreased urine production.   °¨ Frequent urination, especially at night.   °¨ Frequent accidents in children who are potty trained.   °· Muscle twitches and cramps.   °· Shortness of breath.  °· Weakness.   °· Persistent itchiness.   °· Loss of appetite. °· Metallic taste in the mouth. °· Trouble sleeping. °· Slowed development in children. °· Short stature in children. °DIAGNOSIS  °Chronic kidney disease may be detected and diagnosed by tests, including blood, urine, imaging, or kidney biopsy tests.  °TREATMENT  °Most chronic kidney diseases cannot be cured. Treatment usually involves relieving symptoms and preventing or slowing the progression of the disease. Treatment may include:  °· A special diet. You may need to avoid alcohol and foods that are salty and high in potassium.   °· Medicines. These may:   °¨ Lower blood pressure.   °¨ Relieve anemia.   °¨ Relieve swelling.   °¨ Protect the bones. °HOME CARE INSTRUCTIONS  °· Follow your prescribed diet.   °· Take medicines only as directed by your health care provider. Do not take any new medicines (prescription, over-the-counter, or nutritional supplements) unless approved by your health care provider. Many medicines can worsen your kidney damage or need to have the dose adjusted.   °· Quit smoking if you smoke. Talk to your health care provider about a smoking cessation program.   °· Keep all follow-up visits as directed by your health care provider. °SEEK IMMEDIATE MEDICAL CARE IF: °· Your symptoms get worse or you develop new symptoms.   °· You develop symptoms of end-stage kidney disease. These   include:   °¨ Headaches.   °¨ Abnormally dark or light skin.   °¨ Numbness in the hands or feet.   °¨ Easy bruising.   °¨ Frequent hiccups.   °¨ Menstruation stops.   °· You have a fever.   °· You have decreased urine production.   °· You have pain or bleeding when urinating. °MAKE SURE YOU: °· Understand these instructions. °· Will watch your condition. °· Will  get help right away if you are not doing well or get worse. °FOR MORE INFORMATION  °· American Association of Kidney Patients: www.aakp.org °· National Kidney Foundation: www.kidney.org °· American Kidney Fund: www.akfinc.org °· [employer] [facility]: www.lifeoptions.org and www.kidneyschool.org °Document Released: 04/07/2008 Document Revised: 11/13/2013 Document Reviewed: 02/26/2012 °ExitCare® Patient Information ©2015 [employer]. This information is not intended to replace advice given to you by your health care provider. Make sure you discuss any questions you have with your health care provider. ° °

## 2014-04-02 NOTE — Progress Notes (Signed)
   Subjective:    Patient ID: Helen Wilson, female    DOB: [birth year], 80 y.o.   MRN: [id number]  HPI  Pt here to repeat urine and go over labs.  No new complaints.  Still c/o burning with urination.  Review of Systems As above      Objective:   Physical Exam  BP 162/76  Pulse 61  Temp(Src) 97.8 F (36.6 C) (Oral)  Wt 194 lb 0.1 oz (88 kg)  SpO2 98% General appearance: alert, cooperative, appears stated age and no distress       Assessment & Plan:  .1. Urine frequency Recheck urine - POCT Urinalysis Dipstick - Urine Culture  2. Essential hypertension Change meds secondary to bun/cr elevated.--- d/c lisinopril - amLODipine (NORVASC) 5 MG tablet; Take 1 tablet (5 mg total) by mouth daily.  Dispense: 90 tablet; Refill: 3  3. Bursitis D/c mobic an try ultram - traMADol (ULTRAM) 50 MG tablet; Take 1 tablet (50 mg total) by mouth every 8 (eight) hours as needed.  Dispense: 30 tablet; Refill: 0

## 2014-04-03 LAB — URINE CULTURE: Colony Count: >=100000

## 2014-04-13 ENCOUNTER — Encounter: Payer: Self-pay | Admitting: Family Medicine

## 2014-04-13 ENCOUNTER — Ambulatory Visit (INDEPENDENT_AMBULATORY_CARE_PROVIDER_SITE_OTHER): Payer: Medicare Other | Admitting: Family Medicine

## 2014-04-13 VITALS — BP 134/70 | HR 69 | Temp 98.7°F | Wt 194.0 lb

## 2014-04-13 DIAGNOSIS — N289 Disorder of kidney and ureter, unspecified: Secondary | ICD-10-CM

## 2014-04-13 DIAGNOSIS — R350 Frequency of micturition: Secondary | ICD-10-CM | POA: Diagnosis not present

## 2014-04-13 LAB — POCT URINALYSIS DIPSTICK
Bilirubin, UA: NEGATIVE
Blood, UA: NEGATIVE
Glucose, UA: NEGATIVE
Ketones, UA: NEGATIVE
Nitrite, UA: NEGATIVE
Protein, UA: NEGATIVE
Spec Grav, UA: 1.02
Urobilinogen, UA: 0.2
pH, UA: 5.5

## 2014-04-13 MED ORDER — CIPROFLOXACIN HCL 250 MG PO TABS: 250.0000 mg | ORAL_TABLET | Freq: Two times a day (BID) | ORAL | Status: DC

## 2014-04-13 NOTE — Patient Instructions (Signed)
Chronic Kidney Disease °Chronic kidney disease occurs when the kidneys are damaged over a long period. The kidneys are two organs that lie on either side of the spine between the middle of the back and the front of the abdomen. The kidneys:  °· Remove wastes and extra water from the blood.   °· Produce important hormones. These help keep bones strong, regulate blood pressure, and help create red blood cells.   °· Balance the fluids and chemicals in the blood and tissues. °A small amount of kidney damage may not cause problems, but a large amount of damage may make it difficult or impossible for the kidneys to work the way they should. If steps are not taken to slow down the kidney damage or stop it from getting worse, the kidneys may stop working permanently. Most of the time, chronic kidney disease does not go away. However, it can often be controlled, and those with the disease can usually live normal lives. °CAUSES  °The most common causes of chronic kidney disease are diabetes and high blood pressure (hypertension). Chronic kidney disease may also be caused by:  °· Diseases that cause the kidneys' filters to become inflamed.   °· Diseases that affect the immune system.   °· Genetic diseases.   °· Medicines that damage the kidneys, such as anti-inflammatory medicines.   °· Poisoning or exposure to toxic substances.   °· A reoccurring kidney or urinary infection.   °· A problem with urine flow. This may be caused by:   °¨ Cancer.   °¨ Kidney stones.   °¨ An enlarged prostate in males. °SIGNS AND SYMPTOMS  °Because the kidney damage in chronic kidney disease occurs slowly, symptoms develop slowly and may not be obvious until the kidney damage becomes severe. A person may have a kidney disease for years without showing any symptoms. Symptoms can include:  °· Swelling (edema) of the legs, ankles, or feet.   °· Tiredness (lethargy).   °· Nausea or vomiting.   °· Confusion.   °· Problems with urination, such as:    °¨ Decreased urine production.   °¨ Frequent urination, especially at night.   °¨ Frequent accidents in children who are potty trained.   °· Muscle twitches and cramps.   °· Shortness of breath.  °· Weakness.   °· Persistent itchiness.   °· Loss of appetite. °· Metallic taste in the mouth. °· Trouble sleeping. °· Slowed development in children. °· Short stature in children. °DIAGNOSIS  °Chronic kidney disease may be detected and diagnosed by tests, including blood, urine, imaging, or kidney biopsy tests.  °TREATMENT  °Most chronic kidney diseases cannot be cured. Treatment usually involves relieving symptoms and preventing or slowing the progression of the disease. Treatment may include:  °· A special diet. You may need to avoid alcohol and foods that are salty and high in potassium.   °· Medicines. These may:   °¨ Lower blood pressure.   °¨ Relieve anemia.   °¨ Relieve swelling.   °¨ Protect the bones. °HOME CARE INSTRUCTIONS  °· Follow your prescribed diet.   °· Take medicines only as directed by your health care provider. Do not take any new medicines (prescription, over-the-counter, or nutritional supplements) unless approved by your health care provider. Many medicines can worsen your kidney damage or need to have the dose adjusted.   °· Quit smoking if you smoke. Talk to your health care provider about a smoking cessation program.   °· Keep all follow-up visits as directed by your health care provider. °SEEK IMMEDIATE MEDICAL CARE IF: °· Your symptoms get worse or you develop new symptoms.   °· You develop symptoms of end-stage kidney disease. These   include:   °¨ Headaches.   °¨ Abnormally dark or light skin.   °¨ Numbness in the hands or feet.   °¨ Easy bruising.   °¨ Frequent hiccups.   °¨ Menstruation stops.   °· You have a fever.   °· You have decreased urine production.   °· You have pain or bleeding when urinating. °MAKE SURE YOU: °· Understand these instructions. °· Will watch your condition. °· Will  get help right away if you are not doing well or get worse. °FOR MORE INFORMATION  °· American Association of Kidney Patients: www.aakp.org °· National Kidney Foundation: www.kidney.org °· American Kidney Fund: www.akfinc.org °· [employer] [facility]: www.lifeoptions.org and www.kidneyschool.org °Document Released: 04/07/2008 Document Revised: 11/13/2013 Document Reviewed: 02/26/2012 °ExitCare® Patient Information ©2015 [employer]. This information is not intended to replace advice given to you by your health care provider. Make sure you discuss any questions you have with your health care provider. ° °

## 2014-04-13 NOTE — Progress Notes (Signed)
Pre visit review using our clinic review tool, if applicable. No additional management support is needed unless otherwise documented below in the visit note. 

## 2014-04-13 NOTE — Progress Notes (Signed)
   Subjective:    Patient ID: Helen Wilson, female    DOB: 1931/12/19, 80 y.o.   MRN: [id number]  HPI Pt here f/u bp and urine.  She is having some burning with urination.  No other complaints.   Review of Systems As above    Objective:   Physical Exam BP 134/70  Pulse 69  Temp(Src) 98.7 F (37.1 C) (Oral)  Wt 194 lb (87.998 kg)  SpO2 95% General appearance: alert, cooperative, appears stated age and no distress Lungs: clear to auscultation bilaterally Heart: S1, S2 normal Abdomen: soft, non-tender; bowel sounds normal; no masses,  no organomegaly       Assessment & Plan:  1. Urine frequency Contaminated again== recheck culture - POCT Urinalysis Dipstick - Urine Culture - ciprofloxacin (CIPRO) 250 MG tablet; Take 1 tablet (250 mg total) by mouth 2 (two) times daily.  Dispense: 6 tablet; Refill: 0  2. Renal insufficiency  - Basic metabolic panel; Future

## 2014-04-14 LAB — URINE CULTURE: Colony Count: 35000

## 2014-04-16 DIAGNOSIS — H3532 Exudative age-related macular degeneration: Secondary | ICD-10-CM | POA: Diagnosis not present

## 2014-04-17 ENCOUNTER — Encounter: Payer: Medicare Other | Admitting: Family Medicine

## 2014-04-17 NOTE — Progress Notes (Signed)
Quick Note:  Called the patient at and the line was busy @[phone] (Home) ______

## 2014-04-20 ENCOUNTER — Other Ambulatory Visit: Payer: Self-pay

## 2014-04-20 DIAGNOSIS — N390 Urinary tract infection, site not specified: Secondary | ICD-10-CM

## 2014-04-23 ENCOUNTER — Other Ambulatory Visit (INDEPENDENT_AMBULATORY_CARE_PROVIDER_SITE_OTHER): Payer: Medicare Other

## 2014-04-23 DIAGNOSIS — N390 Urinary tract infection, site not specified: Secondary | ICD-10-CM | POA: Diagnosis not present

## 2014-04-23 LAB — POCT URINALYSIS DIPSTICK
Bilirubin, UA: NEGATIVE
Blood, UA: NEGATIVE
Glucose, UA: NEGATIVE
Ketones, UA: NEGATIVE
Leukocytes, UA: NEGATIVE
Nitrite, UA: NEGATIVE
Spec Grav, UA: 1.025
Urobilinogen, UA: 0.2
pH, UA: 5

## 2014-04-24 LAB — URINE CULTURE: Colony Count: 65000

## 2014-04-26 DIAGNOSIS — L570 Actinic keratosis: Secondary | ICD-10-CM | POA: Diagnosis not present

## 2014-04-26 DIAGNOSIS — L821 Other seborrheic keratosis: Secondary | ICD-10-CM | POA: Diagnosis not present

## 2014-04-27 ENCOUNTER — Other Ambulatory Visit (INDEPENDENT_AMBULATORY_CARE_PROVIDER_SITE_OTHER): Payer: Medicare Other

## 2014-04-27 ENCOUNTER — Other Ambulatory Visit: Payer: Medicare Other

## 2014-04-27 DIAGNOSIS — N289 Disorder of kidney and ureter, unspecified: Secondary | ICD-10-CM

## 2014-04-27 LAB — BASIC METABOLIC PANEL
BUN: 20 mg/dL (ref 6–23)
CO2: 28 mEq/L (ref 19–32)
Calcium: 9.4 mg/dL (ref 8.4–10.5)
Chloride: 108 mEq/L (ref 96–112)
Creatinine, Ser: 1.3 mg/dL — ABNORMAL HIGH (ref 0.4–1.2)
GFR: 43.81 mL/min — ABNORMAL LOW (ref >60.00)
Glucose, Bld: 119 mg/dL — ABNORMAL HIGH (ref 70–99)
Potassium: 4.5 mEq/L (ref 3.5–5.1)
Sodium: 141 mEq/L (ref 135–145)

## 2014-05-10 ENCOUNTER — Encounter: Payer: Self-pay | Admitting: Family Medicine

## 2014-05-10 ENCOUNTER — Ambulatory Visit (INDEPENDENT_AMBULATORY_CARE_PROVIDER_SITE_OTHER): Payer: Medicare Other | Admitting: Family Medicine

## 2014-05-10 VITALS — BP 114/70 | HR 57 | Temp 98.1°F | Wt 196.8 lb

## 2014-05-10 DIAGNOSIS — R799 Abnormal finding of blood chemistry, unspecified: Secondary | ICD-10-CM

## 2014-05-10 DIAGNOSIS — R739 Hyperglycemia, unspecified: Secondary | ICD-10-CM

## 2014-05-10 DIAGNOSIS — R7989 Other specified abnormal findings of blood chemistry: Secondary | ICD-10-CM

## 2014-05-10 LAB — BASIC METABOLIC PANEL
BUN: 25 mg/dL — ABNORMAL HIGH (ref 6–23)
CO2: 25 mEq/L (ref 19–32)
Calcium: 9.4 mg/dL (ref 8.4–10.5)
Chloride: 110 mEq/L (ref 96–112)
Creatinine, Ser: 1.3 mg/dL — ABNORMAL HIGH (ref 0.4–1.2)
GFR: 42.62 mL/min — ABNORMAL LOW (ref >60.00)
Glucose, Bld: 92 mg/dL (ref 70–99)
Potassium: 4.2 mEq/L (ref 3.5–5.1)
Sodium: 139 mEq/L (ref 135–145)

## 2014-05-10 LAB — HEMOGLOBIN A1C: Hgb A1c MFr Bld: 5.6 % (ref 4.6–6.5)

## 2014-05-10 NOTE — Progress Notes (Signed)
   Subjective:    Patient ID: Helen Wilson, female    DOB: [birth year], 80 y.o.   MRN: [id number]  HPI Pt is here to go over labs and recheck glucose and kidney function.  No new complaints.    Review of Systems As above    Objective:   Physical Exam BP 114/70  Pulse 57  Temp(Src) 98.1 F (36.7 C) (Oral)  Wt 196 lb 12.8 oz (89.268 kg)  SpO2 96% General appearance: alert, cooperative, appears stated age and no distress Lungs: clear to auscultation bilaterally Heart: S1, S2 normal Extremities: extremities normal, atraumatic, no cyanosis or edema       Assessment & Plan:  1. Abnormal serum creatinine level Recheck labs--- has been stable-- pt taking mobic again - Basic metabolic panel  2. Hyperglycemia Check labs-- watch simple sugars and starches - Hemoglobin A1c

## 2014-05-10 NOTE — Progress Notes (Signed)
Pre visit review using our clinic review tool, if applicable. No additional management support is needed unless otherwise documented below in the visit note. 

## 2014-05-10 NOTE — Patient Instructions (Signed)
Chronic Kidney Disease °Chronic kidney disease occurs when the kidneys are damaged over a long period. The kidneys are two organs that lie on either side of the spine between the middle of the back and the front of the abdomen. The kidneys:  °· Remove wastes and extra water from the blood.   °· Produce important hormones. These help keep bones strong, regulate blood pressure, and help create red blood cells.   °· Balance the fluids and chemicals in the blood and tissues. °A small amount of kidney damage may not cause problems, but a large amount of damage may make it difficult or impossible for the kidneys to work the way they should. If steps are not taken to slow down the kidney damage or stop it from getting worse, the kidneys may stop working permanently. Most of the time, chronic kidney disease does not go away. However, it can often be controlled, and those with the disease can usually live normal lives. °CAUSES  °The most common causes of chronic kidney disease are diabetes and high blood pressure (hypertension). Chronic kidney disease may also be caused by:  °· Diseases that cause the kidneys' filters to become inflamed.   °· Diseases that affect the immune system.   °· Genetic diseases.   °· Medicines that damage the kidneys, such as anti-inflammatory medicines.   °· Poisoning or exposure to toxic substances.   °· A reoccurring kidney or urinary infection.   °· A problem with urine flow. This may be caused by:   °¨ Cancer.   °¨ Kidney stones.   °¨ An enlarged prostate in males. °SIGNS AND SYMPTOMS  °Because the kidney damage in chronic kidney disease occurs slowly, symptoms develop slowly and may not be obvious until the kidney damage becomes severe. A person may have a kidney disease for years without showing any symptoms. Symptoms can include:  °· Swelling (edema) of the legs, ankles, or feet.   °· Tiredness (lethargy).   °· Nausea or vomiting.   °· Confusion.   °· Problems with urination, such as:    °¨ Decreased urine production.   °¨ Frequent urination, especially at night.   °¨ Frequent accidents in children who are potty trained.   °· Muscle twitches and cramps.   °· Shortness of breath.  °· Weakness.   °· Persistent itchiness.   °· Loss of appetite. °· Metallic taste in the mouth. °· Trouble sleeping. °· Slowed development in children. °· Short stature in children. °DIAGNOSIS  °Chronic kidney disease may be detected and diagnosed by tests, including blood, urine, imaging, or kidney biopsy tests.  °TREATMENT  °Most chronic kidney diseases cannot be cured. Treatment usually involves relieving symptoms and preventing or slowing the progression of the disease. Treatment may include:  °· A special diet. You may need to avoid alcohol and foods that are salty and high in potassium.   °· Medicines. These may:   °¨ Lower blood pressure.   °¨ Relieve anemia.   °¨ Relieve swelling.   °¨ Protect the bones. °HOME CARE INSTRUCTIONS  °· Follow your prescribed diet.   °· Take medicines only as directed by your health care provider. Do not take any new medicines (prescription, over-the-counter, or nutritional supplements) unless approved by your health care provider. Many medicines can worsen your kidney damage or need to have the dose adjusted.   °· Quit smoking if you smoke. Talk to your health care provider about a smoking cessation program.   °· Keep all follow-up visits as directed by your health care provider. °SEEK IMMEDIATE MEDICAL CARE IF: °· Your symptoms get worse or you develop new symptoms.   °· You develop symptoms of end-stage kidney disease. These   include:   °¨ Headaches.   °¨ Abnormally dark or light skin.   °¨ Numbness in the hands or feet.   °¨ Easy bruising.   °¨ Frequent hiccups.   °¨ Menstruation stops.   °· You have a fever.   °· You have decreased urine production.   °· You have pain or bleeding when urinating. °MAKE SURE YOU: °· Understand these instructions. °· Will watch your condition. °· Will  get help right away if you are not doing well or get worse. °FOR MORE INFORMATION  °· American Association of Kidney Patients: www.aakp.org °· National Kidney Foundation: www.kidney.org °· American Kidney Fund: www.akfinc.org °· [employer] [facility]: www.lifeoptions.org and www.kidneyschool.org °Document Released: 04/07/2008 Document Revised: 11/13/2013 Document Reviewed: 02/26/2012 °ExitCare® Patient Information ©2015 [employer]. This information is not intended to replace advice given to you by your health care provider. Make sure you discuss any questions you have with your health care provider. ° °

## 2014-05-21 DIAGNOSIS — H3532 Exudative age-related macular degeneration: Secondary | ICD-10-CM | POA: Diagnosis not present

## 2014-05-29 DIAGNOSIS — L570 Actinic keratosis: Secondary | ICD-10-CM | POA: Diagnosis not present

## 2014-05-29 DIAGNOSIS — L578 Other skin changes due to chronic exposure to nonionizing radiation: Secondary | ICD-10-CM | POA: Diagnosis not present

## 2014-06-27 DIAGNOSIS — H3532 Exudative age-related macular degeneration: Secondary | ICD-10-CM | POA: Diagnosis not present

## 2014-07-04 DIAGNOSIS — E785 Hyperlipidemia, unspecified: Secondary | ICD-10-CM | POA: Diagnosis not present

## 2014-07-04 DIAGNOSIS — I4891 Unspecified atrial fibrillation: Secondary | ICD-10-CM | POA: Diagnosis not present

## 2014-07-17 ENCOUNTER — Encounter: Payer: Self-pay | Admitting: Family Medicine

## 2014-08-10 DIAGNOSIS — E785 Hyperlipidemia, unspecified: Secondary | ICD-10-CM | POA: Diagnosis not present

## 2014-08-10 DIAGNOSIS — I4891 Unspecified atrial fibrillation: Secondary | ICD-10-CM | POA: Diagnosis not present

## 2014-08-16 DIAGNOSIS — H3532 Exudative age-related macular degeneration: Secondary | ICD-10-CM | POA: Diagnosis not present

## 2014-08-16 DIAGNOSIS — H3531 Nonexudative age-related macular degeneration: Secondary | ICD-10-CM | POA: Diagnosis not present

## 2014-09-06 DIAGNOSIS — S83242A Other tear of medial meniscus, current injury, left knee, initial encounter: Secondary | ICD-10-CM | POA: Diagnosis not present

## 2014-09-06 DIAGNOSIS — M25562 Pain in left knee: Secondary | ICD-10-CM | POA: Diagnosis not present

## 2014-09-07 DIAGNOSIS — E785 Hyperlipidemia, unspecified: Secondary | ICD-10-CM | POA: Diagnosis not present

## 2014-09-07 DIAGNOSIS — I480 Paroxysmal atrial fibrillation: Secondary | ICD-10-CM | POA: Diagnosis not present

## 2014-09-20 DIAGNOSIS — S8002XD Contusion of left knee, subsequent encounter: Secondary | ICD-10-CM | POA: Diagnosis not present

## 2014-09-20 DIAGNOSIS — S83242D Other tear of medial meniscus, current injury, left knee, subsequent encounter: Secondary | ICD-10-CM | POA: Diagnosis not present

## 2014-10-01 DIAGNOSIS — H3532 Exudative age-related macular degeneration: Secondary | ICD-10-CM | POA: Diagnosis not present

## 2014-10-23 DIAGNOSIS — M7062 Trochanteric bursitis, left hip: Secondary | ICD-10-CM | POA: Diagnosis not present

## 2014-10-23 DIAGNOSIS — M25562 Pain in left knee: Secondary | ICD-10-CM | POA: Diagnosis not present

## 2014-10-23 DIAGNOSIS — S83242D Other tear of medial meniscus, current injury, left knee, subsequent encounter: Secondary | ICD-10-CM | POA: Diagnosis not present

## 2014-11-01 DIAGNOSIS — R262 Difficulty in walking, not elsewhere classified: Secondary | ICD-10-CM | POA: Diagnosis not present

## 2014-11-01 DIAGNOSIS — R2689 Other abnormalities of gait and mobility: Secondary | ICD-10-CM | POA: Diagnosis not present

## 2014-11-01 DIAGNOSIS — M25562 Pain in left knee: Secondary | ICD-10-CM | POA: Diagnosis not present

## 2014-11-01 DIAGNOSIS — M6281 Muscle weakness (generalized): Secondary | ICD-10-CM | POA: Diagnosis not present

## 2014-11-08 DIAGNOSIS — E785 Hyperlipidemia, unspecified: Secondary | ICD-10-CM | POA: Diagnosis not present

## 2014-11-08 DIAGNOSIS — I4891 Unspecified atrial fibrillation: Secondary | ICD-10-CM | POA: Diagnosis not present

## 2014-11-13 DIAGNOSIS — K648 Other hemorrhoids: Secondary | ICD-10-CM | POA: Diagnosis not present

## 2014-11-14 ENCOUNTER — Telehealth: Payer: Self-pay | Admitting: Family Medicine

## 2014-11-14 DIAGNOSIS — H3532 Exudative age-related macular degeneration: Secondary | ICD-10-CM | POA: Diagnosis not present

## 2014-11-14 DIAGNOSIS — E785 Hyperlipidemia, unspecified: Secondary | ICD-10-CM

## 2014-11-14 MED ORDER — SIMVASTATIN 20 MG PO TABS: ORAL_TABLET | ORAL | Status: DC

## 2014-11-14 NOTE — Telephone Encounter (Signed)
Caller name: [employer]  Call back number: [phone]   [facility] [address] [place], [place] [phone] (Phone) [phone] (Fax)         Reason for call: pharmacy requesting a refill simvastatin (ZOCOR) 20 MG tablet. Pharmacy stated pt is confused in regards to what BP medication pt is taking. Pt asked for a refill lisinopril and pharmacy stated pt is on amLODipine

## 2014-11-14 NOTE — Telephone Encounter (Signed)
Discussed with patient and I made her aware she is on Amlodipine and she said she had been taking both and has been getting dizzy, she stopped when notified by the pharmacy. She said she will be out of town the next few week but seen another provider yesterday and she was 134/82. I scheduled a follow up for 12/14/14 at 10:15 and advised the patient to be fasting.      KP

## 2014-12-11 ENCOUNTER — Telehealth: Payer: Self-pay | Admitting: Family Medicine

## 2014-12-11 NOTE — Telephone Encounter (Signed)
Relation to pt: self Call back number: [phone]  Reason for call:  Pt states she will have Dr. Georgeanna Harrison. Quillian Quince, DO cardioliogist fax over [place] office notes/results. Pt would like PCP to review prior to appointment 12/14/14.

## 2014-12-14 ENCOUNTER — Encounter: Payer: Self-pay | Admitting: Family Medicine

## 2014-12-14 ENCOUNTER — Ambulatory Visit (INDEPENDENT_AMBULATORY_CARE_PROVIDER_SITE_OTHER): Payer: Medicare Other | Admitting: Family Medicine

## 2014-12-14 VITALS — BP 116/53 | HR 58 | Temp 97.5°F | Ht 63.0 in | Wt 197.6 lb

## 2014-12-14 DIAGNOSIS — E785 Hyperlipidemia, unspecified: Secondary | ICD-10-CM | POA: Diagnosis not present

## 2014-12-14 DIAGNOSIS — I10 Essential (primary) hypertension: Secondary | ICD-10-CM

## 2014-12-14 DIAGNOSIS — I482 Chronic atrial fibrillation, unspecified: Secondary | ICD-10-CM

## 2014-12-14 DIAGNOSIS — I4891 Unspecified atrial fibrillation: Secondary | ICD-10-CM | POA: Insufficient documentation

## 2014-12-14 LAB — HEPATIC FUNCTION PANEL
ALT: 23 U/L (ref 0–35)
AST: 24 U/L (ref 0–37)
Albumin: 4 g/dL (ref 3.5–5.2)
Alkaline Phosphatase: 73 U/L (ref 39–117)
Bilirubin, Direct: 0.2 mg/dL (ref 0.0–0.3)
Total Bilirubin: 1 mg/dL (ref 0.2–1.2)
Total Protein: 7.1 g/dL (ref 6.0–8.3)

## 2014-12-14 LAB — BASIC METABOLIC PANEL
BUN: 32 mg/dL — ABNORMAL HIGH (ref 6–23)
CO2: 25 mEq/L (ref 19–32)
Calcium: 9.3 mg/dL (ref 8.4–10.5)
Chloride: 108 mEq/L (ref 96–112)
Creatinine, Ser: 1.35 mg/dL — ABNORMAL HIGH (ref 0.40–1.20)
GFR: 40.02 mL/min — ABNORMAL LOW (ref >60.00)
Glucose, Bld: 106 mg/dL — ABNORMAL HIGH (ref 70–99)
Potassium: 4.3 mEq/L (ref 3.5–5.1)
Sodium: 138 mEq/L (ref 135–145)

## 2014-12-14 LAB — LIPID PANEL
Cholesterol: 149 mg/dL (ref 0–200)
HDL: 68.5 mg/dL (ref >39.00)
LDL Cholesterol: 69 mg/dL (ref 0–99)
NonHDL: 80.5
Total CHOL/HDL Ratio: 2
Triglycerides: 58 mg/dL (ref 0.0–149.0)
VLDL: 11.6 mg/dL (ref 0.0–40.0)

## 2014-12-14 MED ORDER — SIMVASTATIN 20 MG PO TABS: ORAL_TABLET | ORAL | Status: DC

## 2014-12-14 MED ORDER — AMLODIPINE BESYLATE 5 MG PO TABS: 5.0000 mg | ORAL_TABLET | Freq: Every day | ORAL | Status: DC

## 2014-12-14 MED ORDER — AMLODIPINE BESYLATE 2.5 MG PO TABS: 2.5000 mg | ORAL_TABLET | Freq: Every day | ORAL | Status: DC

## 2014-12-14 NOTE — Assessment & Plan Note (Signed)
Pt will discuss other options with cardiology Call or rto in 6 monhs

## 2014-12-14 NOTE — Progress Notes (Signed)
Patient ID: Helen Wilson, female    DOB: Sep 28, 1931  Age: 81 y.o. MRN: [id number]    Subjective:  Subjective HPI Helen Wilson presents for f/u from cardio for a fib.  Pt could not tolerate the eliquis and then was told to try asa 325 mg and she could not tolerate it.  Review of Systems  Constitutional: Negative for activity change, appetite change, fatigue and unexpected weight change.  Respiratory: Negative for cough and shortness of breath.   Cardiovascular: Negative for chest pain, palpitations and leg swelling.  Psychiatric/Behavioral: Negative for behavioral problems and dysphoric mood. The patient is not nervous/anxious.     History Past Medical History  Diagnosis Date  . Hyperlipidemia   . Hypertension   . Arthritis   . Hemorrhoids   . Heart murmur   . Osteoporosis     She has past surgical history that includes Abdominal hysterectomy; Breast biopsy; Knee arthroscopy; and LEEP.   Her family history includes Arthritis in an other family member; Diabetes in an other family member; Heart disease in an other family member; Parkinsonism in her mother; Rheum arthritis in her mother.She reports that she has never smoked. She has never used smokeless tobacco. She reports that she does not drink alcohol or use illicit drugs.  Current Outpatient Prescriptions on File Prior to Visit  Medication Sig Dispense Refill  . aspirin 81 MG tablet Take 81 mg by mouth daily.       No current facility-administered medications on file prior to visit.     Objective:  Objective Physical Exam  Constitutional: She is oriented to person, place, and time. She appears well-developed and well-nourished.  HENT:  Head: Normocephalic and atraumatic.  Eyes: Conjunctivae and EOM are normal.  Neck: Normal range of motion. Neck supple. No JVD present. Carotid bruit is not present. No thyromegaly present.  Cardiovascular: Normal rate and regular rhythm.   Murmur heard. Pulmonary/Chest: Effort normal and  breath sounds normal. No respiratory distress. She has no wheezes. She has no rales. She exhibits no tenderness.  Musculoskeletal: She exhibits no edema.  Neurological: She is alert and oriented to person, place, and time.  Psychiatric: She has a normal mood and affect. Her behavior is normal.   BP 116/53 mmHg  Pulse 58  Temp(Src) 97.5 F (36.4 C) (Oral)  Ht 5\' 3"  (1.6 m)  Wt 197 lb 9.6 oz (89.631 kg)  BMI 35.01 kg/m2  SpO2 96% Wt Readings from Last 3 Encounters:  12/14/14 197 lb 9.6 oz (89.631 kg)  05/10/14 196 lb 12.8 oz (89.268 kg)  04/13/14 194 lb (87.998 kg)     Lab Results  Component Value Date   WBC 8.0 03/06/2013   HGB 14.4 03/06/2013   HCT 42.3 03/06/2013   PLT 192.0 03/06/2013   GLUCOSE 92 05/10/2014   CHOL 160 03/23/2014   TRIG 80.0 03/23/2014   HDL 61.70 03/23/2014   LDLDIRECT 173.7 03/06/2013   LDLCALC 82 03/23/2014   ALT 15 03/23/2014   AST 21 03/23/2014   NA 139 05/10/2014   K 4.2 05/10/2014   CL 110 05/10/2014   CREATININE 1.3* 05/10/2014   BUN 25* 05/10/2014   CO2 25 05/10/2014   TSH 2.07 01/15/2011   HGBA1C 5.6 05/10/2014    No results found.   Assessment & Plan:  Plan I have discontinued Ms. Hagans's amLODipine and amLODipine. I am also having her start on amLODipine. Additionally, I am having her maintain her aspirin, sotalol, and simvastatin.  Meds ordered this encounter  Medications  . sotalol (BETAPACE) 120 MG tablet    Sig: 120 mg 2 (two) times daily.    Refill:  1  . DISCONTD: amLODipine (NORVASC) 5 MG tablet    Sig: Take 1 tablet (5 mg total) by mouth daily.    Dispense:  90 tablet    Refill:  3  . simvastatin (ZOCOR) 20 MG tablet    Sig: TAKE ONE TABLET BY MOUTH AT BEDTIME    Dispense:  90 tablet    Refill:  1  . amLODipine (NORVASC) 2.5 MG tablet    Sig: Take 1 tablet (2.5 mg total) by mouth daily.    Dispense:  90 tablet    Refill:  3    Problem List Items Addressed This Visit    Atrial fibrillation    Pt will discuss  other options with cardiology Call or rto in 6 monhs      Relevant Medications   sotalol (BETAPACE) 120 MG tablet   simvastatin (ZOCOR) 20 MG tablet   amLODipine (NORVASC) 2.5 MG tablet    Other Visit Diagnoses    Essential hypertension    -  Primary    Relevant Medications    sotalol (BETAPACE) 120 MG tablet    simvastatin (ZOCOR) 20 MG tablet    amLODipine (NORVASC) 2.5 MG tablet    Other Relevant Orders    Basic metabolic panel    Hyperlipidemia        Relevant Medications    sotalol (BETAPACE) 120 MG tablet    simvastatin (ZOCOR) 20 MG tablet    amLODipine (NORVASC) 2.5 MG tablet    Other Relevant Orders    Lipid panel    Hepatic function panel      Face to face >25 min with > 50% in directed contact Follow-up: Return in about 6 months (around 06/15/2015), or if symptoms worsen or fail to improve, for hypertension.  Helen Koyanagi, DO

## 2014-12-14 NOTE — Progress Notes (Signed)
Pre visit review using our clinic review tool, if applicable. No additional management support is needed unless otherwise documented below in the visit note. 

## 2014-12-14 NOTE — Patient Instructions (Signed)

## 2014-12-27 DIAGNOSIS — H3532 Exudative age-related macular degeneration: Secondary | ICD-10-CM | POA: Diagnosis not present

## 2015-01-24 ENCOUNTER — Telehealth: Payer: Self-pay | Admitting: Family Medicine

## 2015-01-24 DIAGNOSIS — E785 Hyperlipidemia, unspecified: Secondary | ICD-10-CM

## 2015-01-24 MED ORDER — SIMVASTATIN 20 MG PO TABS: ORAL_TABLET | ORAL | Status: DC

## 2015-01-24 NOTE — Telephone Encounter (Signed)
Relation to pt: self  Call back number: [phone] Pharmacy: [facility]  Reason for call:  Pt requesting a refill simvastatin (ZOCOR) 20 MG tablet

## 2015-01-24 NOTE — Addendum Note (Signed)
Addended by: Ewing Schlein on: 01/24/2015 11:05 AM   Modules accepted: Orders

## 2015-02-01 DIAGNOSIS — S83242D Other tear of medial meniscus, current injury, left knee, subsequent encounter: Secondary | ICD-10-CM | POA: Diagnosis not present

## 2015-02-14 DIAGNOSIS — H3532 Exudative age-related macular degeneration: Secondary | ICD-10-CM | POA: Diagnosis not present

## 2015-03-23 ENCOUNTER — Other Ambulatory Visit: Payer: Self-pay | Admitting: Family Medicine

## 2015-03-25 NOTE — Telephone Encounter (Signed)
Rx is no longer on the medication list. Please advise     KP

## 2015-04-03 DIAGNOSIS — S9031XA Contusion of right foot, initial encounter: Secondary | ICD-10-CM | POA: Diagnosis not present

## 2015-04-04 DIAGNOSIS — H3531 Nonexudative age-related macular degeneration: Secondary | ICD-10-CM | POA: Diagnosis not present

## 2015-04-04 DIAGNOSIS — H3532 Exudative age-related macular degeneration: Secondary | ICD-10-CM | POA: Diagnosis not present

## 2015-05-23 DIAGNOSIS — H353221 Exudative age-related macular degeneration, left eye, with active choroidal neovascularization: Secondary | ICD-10-CM | POA: Diagnosis not present

## 2015-06-10 ENCOUNTER — Other Ambulatory Visit: Payer: Self-pay | Admitting: Family Medicine

## 2015-06-10 NOTE — Telephone Encounter (Signed)
Caller name: Self   Can be reached: [phone]  Pharmacy:  [facility] [address] [place], [place] [phone] (Phone) [phone] (Fax)         Reason for call: Request refill on meloxicam (MOBIC) 7.5 MG tablet [id number]

## 2015-06-11 MED ORDER — MELOXICAM 7.5 MG PO TABS: ORAL_TABLET | ORAL | Status: DC

## 2015-06-11 NOTE — Telephone Encounter (Signed)
Pt called in to follow up on medication refill.    CB: [phone]

## 2015-06-11 NOTE — Telephone Encounter (Signed)
Pt notified that rx was sent, also to schedule appointment in December per Dr Etter Sjogren for follow-up. Pt agrees and will call back to schedule.

## 2015-07-04 DIAGNOSIS — H353222 Exudative age-related macular degeneration, left eye, with inactive choroidal neovascularization: Secondary | ICD-10-CM | POA: Diagnosis not present

## 2015-07-30 ENCOUNTER — Telehealth: Payer: Self-pay | Admitting: Family Medicine

## 2015-07-30 DIAGNOSIS — E785 Hyperlipidemia, unspecified: Secondary | ICD-10-CM

## 2015-07-30 MED ORDER — SIMVASTATIN 20 MG PO TABS: ORAL_TABLET | ORAL | Status: DC

## 2015-07-30 NOTE — Telephone Encounter (Signed)
Caller name: Self  Can be reached: [phone]  Pharmacy:  [facility] [address] [place], [place] [phone]) [phone] (Fax)        Reason for call: Request refill on simvastatin (ZOCOR) 20 MG tablet [id number]

## 2015-07-30 NOTE — Telephone Encounter (Signed)
Rx faxed.    KP 

## 2015-08-22 DIAGNOSIS — H353222 Exudative age-related macular degeneration, left eye, with inactive choroidal neovascularization: Secondary | ICD-10-CM | POA: Diagnosis not present

## 2015-08-24 ENCOUNTER — Emergency Department (HOSPITAL_BASED_OUTPATIENT_CLINIC_OR_DEPARTMENT_OTHER)
Admission: EM | Admit: 2015-08-24 | Discharge: 2015-08-24 | Disposition: A | Discharge status: Home / Self Care | Payer: Medicare Other | Attending: Emergency Medicine | Admitting: Emergency Medicine

## 2015-08-24 ENCOUNTER — Emergency Department (HOSPITAL_BASED_OUTPATIENT_CLINIC_OR_DEPARTMENT_OTHER): Payer: Medicare Other

## 2015-08-24 ENCOUNTER — Encounter (HOSPITAL_BASED_OUTPATIENT_CLINIC_OR_DEPARTMENT_OTHER): Payer: Self-pay

## 2015-08-24 DIAGNOSIS — R079 Chest pain, unspecified: Secondary | ICD-10-CM | POA: Diagnosis not present

## 2015-08-24 DIAGNOSIS — I10 Essential (primary) hypertension: Secondary | ICD-10-CM | POA: Insufficient documentation

## 2015-08-24 DIAGNOSIS — Z8719 Personal history of other diseases of the digestive system: Secondary | ICD-10-CM | POA: Diagnosis not present

## 2015-08-24 DIAGNOSIS — Z79899 Other long term (current) drug therapy: Secondary | ICD-10-CM | POA: Diagnosis not present

## 2015-08-24 DIAGNOSIS — M1990 Unspecified osteoarthritis, unspecified site: Secondary | ICD-10-CM | POA: Insufficient documentation

## 2015-08-24 DIAGNOSIS — E785 Hyperlipidemia, unspecified: Secondary | ICD-10-CM | POA: Insufficient documentation

## 2015-08-24 DIAGNOSIS — R0789 Other chest pain: Secondary | ICD-10-CM | POA: Diagnosis not present

## 2015-08-24 DIAGNOSIS — Z7982 Long term (current) use of aspirin: Secondary | ICD-10-CM | POA: Diagnosis not present

## 2015-08-24 DIAGNOSIS — E7800 Pure hypercholesterolemia, unspecified: Secondary | ICD-10-CM | POA: Diagnosis not present

## 2015-08-24 DIAGNOSIS — I499 Cardiac arrhythmia, unspecified: Secondary | ICD-10-CM | POA: Diagnosis not present

## 2015-08-24 DIAGNOSIS — R011 Cardiac murmur, unspecified: Secondary | ICD-10-CM | POA: Insufficient documentation

## 2015-08-24 LAB — BASIC METABOLIC PANEL
Anion gap: 9 (ref 5–15)
BUN: 23 mg/dL — ABNORMAL HIGH (ref 6–20)
CO2: 24 mmol/L (ref 22–32)
Calcium: 9.4 mg/dL (ref 8.9–10.3)
Chloride: 110 mmol/L (ref 101–111)
Creatinine, Ser: 1.11 mg/dL — ABNORMAL HIGH (ref 0.44–1.00)
GFR calc Af Amer: 53 mL/min — ABNORMAL LOW (ref >60)
GFR calc non Af Amer: 45 mL/min — ABNORMAL LOW (ref >60)
Glucose, Bld: 120 mg/dL — ABNORMAL HIGH (ref 65–99)
Potassium: 3.9 mmol/L (ref 3.5–5.1)
Sodium: 143 mmol/L (ref 135–145)

## 2015-08-24 LAB — CBC
HCT: 41.8 % (ref 36.0–46.0)
Hemoglobin: 14.1 g/dL (ref 12.0–15.0)
MCH: 30.2 pg (ref 26.0–34.0)
MCHC: 33.7 g/dL (ref 30.0–36.0)
MCV: 89.5 fL (ref 78.0–100.0)
Platelets: 174 10*3/uL (ref 150–400)
RBC: 4.67 MIL/uL (ref 3.87–5.11)
RDW: 13.8 % (ref 11.5–15.5)
WBC: 5.9 10*3/uL (ref 4.0–10.5)

## 2015-08-24 LAB — TROPONIN I: Troponin I: <0.03 ng/mL (ref <0.031)

## 2015-08-24 MED ORDER — ASPIRIN 81 MG PO CHEW
324.0000 mg | CHEWABLE_TABLET | Freq: Once | ORAL | Status: AC
  Administered 2015-08-24: 243 mg via ORAL
  Filled 2015-08-24: qty 4

## 2015-08-24 MED ORDER — NITROGLYCERIN 0.4 MG SL SUBL: 0.4000 mg | SUBLINGUAL_TABLET | SUBLINGUAL | Status: DC | PRN

## 2015-08-24 NOTE — Discharge Instructions (Signed)
Nonspecific Chest Pain  °Chest pain can be caused by many different conditions. There is always a chance that your pain could be related to something serious, such as a heart attack or a blood clot in your lungs. Chest pain can also be caused by conditions that are not life-threatening. If you have chest pain, it is very important to follow up with your health care provider. °CAUSES  °Chest pain can be caused by: °· Heartburn. °· Pneumonia or bronchitis. °· Anxiety or stress. °· Inflammation around your heart (pericarditis) or lung (pleuritis or pleurisy). °· A blood clot in your lung. °· A collapsed lung (pneumothorax). It can develop suddenly on its own (spontaneous pneumothorax) or from trauma to the chest. °· Shingles infection (varicella-zoster virus). °· Heart attack. °· Damage to the bones, muscles, and cartilage that make up your chest wall. This can include: °¨ Bruised bones due to injury. °¨ Strained muscles or cartilage due to frequent or repeated coughing or overwork. °¨ Fracture to one or more ribs. °¨ Sore cartilage due to inflammation (costochondritis). °RISK FACTORS  °Risk factors for chest pain may include: °· Activities that increase your risk for trauma or injury to your chest. °· Respiratory infections or conditions that cause frequent coughing. °· Medical conditions or overeating that can cause heartburn. °· Heart disease or family history of heart disease. °· Conditions or health behaviors that increase your risk of developing a blood clot. °· Having had chicken pox (varicella zoster). °SIGNS AND SYMPTOMS °Chest pain can feel like: °· Burning or tingling on the surface of your chest or deep in your chest. °· Crushing, pressure, aching, or squeezing pain. °· Dull or sharp pain that is worse when you move, cough, or take a deep breath. °· Pain that is also felt in your back, neck, shoulder, or arm, or pain that spreads to any of these areas. °Your chest pain may come and go, or it may stay  constant. °DIAGNOSIS °Lab tests or other studies may be needed to find the cause of your pain. Your health care provider may have you take a test called an ambulatory ECG (electrocardiogram). An ECG records your heartbeat patterns at the time the test is performed. You may also have other tests, such as: °· Transthoracic echocardiogram (TTE). During echocardiography, sound waves are used to create a picture of all of the heart structures and to look at how blood flows through your heart. °· Transesophageal echocardiogram (TEE). This is a more advanced imaging test that obtains images from inside your body. It allows your health care provider to see your heart in finer detail. °· Cardiac monitoring. This allows your health care provider to monitor your heart rate and rhythm in real time. °· Holter monitor. This is a portable device that records your heartbeat and can help to diagnose abnormal heartbeats. It allows your health care provider to track your heart activity for several days, if needed. °· Stress tests. These can be done through exercise or by taking medicine that makes your heart beat more quickly. °· Blood tests. °· Imaging tests. °TREATMENT  °Your treatment depends on what is causing your chest pain. Treatment may include: °· Medicines. These may include: °¨ Acid blockers for heartburn. °¨ Anti-inflammatory medicine. °¨ Pain medicine for inflammatory conditions. °¨ Antibiotic medicine, if an infection is present. °¨ Medicines to dissolve blood clots. °¨ Medicines to treat coronary artery disease. °· Supportive care for conditions that do not require medicines. This may include: °¨ Resting. °¨ Applying heat   or cold packs to injured areas. °¨ Limiting activities until pain decreases. °HOME CARE INSTRUCTIONS °· If you were prescribed an antibiotic medicine, finish it all even if you start to feel better. °· Avoid any activities that bring on chest pain. °· Do not use any tobacco products, including  cigarettes, chewing tobacco, or electronic cigarettes. If you need help quitting, ask your health care provider. °· Do not drink alcohol. °· Take medicines only as directed by your health care provider. °· Keep all follow-up visits as directed by your health care provider. This is important. This includes any further testing if your chest pain does not go away. °· If heartburn is the cause for your chest pain, you may be told to keep your head raised (elevated) while sleeping. This reduces the chance that acid will go from your stomach into your esophagus. °· Make lifestyle changes as directed by your health care provider. These may include: °¨ Getting regular exercise. Ask your health care provider to suggest some activities that are safe for you. °¨ Eating a heart-healthy diet. A registered dietitian can help you to learn healthy eating options. °¨ Maintaining a healthy weight. °¨ Managing diabetes, if necessary. °¨ Reducing stress. °SEEK MEDICAL CARE IF: °· Your chest pain does not go away after treatment. °· You have a rash with blisters on your chest. °· You have a fever. °SEEK IMMEDIATE MEDICAL CARE IF:  °· Your chest pain is worse. °· You have an increasing cough, or you cough up blood. °· You have severe abdominal pain. °· You have severe weakness. °· You faint. °· You have chills. °· You have sudden, unexplained chest discomfort. °· You have sudden, unexplained discomfort in your arms, back, neck, or jaw. °· You have shortness of breath at any time. °· You suddenly start to sweat, or your skin gets clammy. °· You feel nauseous or you vomit. °· You suddenly feel light-headed or dizzy. °· Your heart begins to beat quickly, or it feels like it is skipping beats. °These symptoms may represent a serious problem that is an emergency. Do not wait to see if the symptoms will go away. Get medical help right away. Call your local emergency services (911 in the [place] Do not drive yourself to the hospital. °  °This  information is not intended to replace advice given to you by your health care provider. Make sure you discuss any questions you have with your health care provider. °  °Document Released: 04/08/2005 Document Revised: 07/20/2014 Document Reviewed: 02/02/2014 °Elsevier Interactive Patient Education ©2016 [employer] ° °

## 2015-08-24 NOTE — ED Provider Notes (Signed)
CSN: [id number]     Arrival date & time 08/24/15  [id number] History   First MD Initiated Contact with Patient 08/24/15 0749     Chief Complaint  Patient presents with  . Chest Pain     (Consider location/radiation/quality/duration/timing/severity/associated sxs/prior Treatment) Patient is a 81 y.o. female presenting with chest pain. The history is provided by the patient.  Chest Pain Pain location:  L chest Pain quality: sharp   Pain radiates to:  Does not radiate Pain radiates to the back: no   Pain severity:  Mild Onset quality:  Sudden Duration: seconds. Timing:  Intermittent Progression:  Resolved Chronicity:  Recurrent Relieved by:  Rest Worsened by:  Nothing tried Ineffective treatments:  None tried Associated symptoms: no dizziness, no fever, no headache, no heartburn, no nausea, no palpitations, no shortness of breath and not vomiting   Risk factors: high cholesterol and hypertension   Risk factors: no coronary artery disease, no diabetes mellitus, no prior DVT/PE and no smoking     82 yo F with a chief complaint chest pain. She describes these events is sharp lasting for a mere second or 2. These are left-sided. She denies radiation. Has had some soreness to her left arm going on for about a week as well. Denies shortness of breath diaphoresis nausea vomiting. Patient denies exertional symptoms. Denies injury. No recent surgeries no history of cancer no prior history of DVT or PE. No recent long travel. Patient is currently asymptomatic. Is unsure what brings these on. She usually stays still on the better.  Past Medical History  Diagnosis Date  . Hyperlipidemia   . Hypertension   . Arthritis   . Hemorrhoids   . Heart murmur   . Osteoporosis    Past Surgical History  Procedure Laterality Date  . Abdominal hysterectomy    . Breast biopsy    . Knee arthroscopy    . Leep     Family History  Problem Relation Age of Onset  . Arthritis    . Heart disease    .  Parkinsonism Mother   . Rheum arthritis Mother   . Diabetes     Social History  Substance Use Topics  . Smoking status: Never Smoker   . Smokeless tobacco: Never Used  . Alcohol Use: No   OB History    No data available     Review of Systems  Constitutional: Negative for fever and chills.  HENT: Negative for congestion and rhinorrhea.   Eyes: Negative for redness and visual disturbance.  Respiratory: Negative for shortness of breath and wheezing.   Cardiovascular: Positive for chest pain. Negative for palpitations.  Gastrointestinal: Negative for heartburn, nausea and vomiting.  Genitourinary: Negative for dysuria and urgency.  Musculoskeletal: Negative for myalgias and arthralgias.  Skin: Negative for pallor and wound.  Neurological: Negative for dizziness and headaches.      Allergies  Review of patient's allergies indicates no known allergies.  Home Medications   Prior to Admission medications   Medication Sig Start Date End Date Taking? Authorizing Provider  amLODipine (NORVASC) 2.5 MG tablet Take 1 tablet (2.5 mg total) by mouth daily. 12/14/14   Rosalita Chessman, DO  aspirin 81 MG tablet Take 81 mg by mouth daily.      Historical Provider, MD  meloxicam (MOBIC) 7.5 MG tablet TAKE ONE TABLET BY MOUTH ONCE DAILY AS NEEDED 06/11/15   Rosalita Chessman, DO  simvastatin (ZOCOR) 20 MG tablet TAKE ONE TABLET BY MOUTH AT BEDTIME  07/30/15   Rosalita Chessman, DO  sotalol (BETAPACE) 120 MG tablet 120 mg 2 (two) times daily. 12/07/14   Historical Provider, MD   BP 140/73 mmHg  Pulse 58  Temp(Src) 98.8 F (37.1 C) (Oral)  Resp 16  Ht 5\' 3"  (1.6 m)  Wt 198 lb (89.812 kg)  BMI 35.08 kg/m2  SpO2 99% Physical Exam  Constitutional: She is oriented to person, place, and time. She appears well-developed and well-nourished. No distress.  HENT:  Head: Normocephalic and atraumatic.  Eyes: EOM are normal. Pupils are equal, round, and reactive to light.  Neck: Normal range of motion. Neck  supple.  Cardiovascular: Normal rate and regular rhythm.  Exam reveals no gallop and no friction rub.   No murmur heard. Pulmonary/Chest: Effort normal. She has no wheezes. She has no rales.  Abdominal: Soft. She exhibits no distension. There is no tenderness. There is no rebound and no guarding.  Musculoskeletal: She exhibits no edema or tenderness.  Left lower extremity greater than right lower extremity. This is a chronic finding for this patient.  Neurological: She is alert and oriented to person, place, and time.  Skin: Skin is warm and dry. She is not diaphoretic.  Psychiatric: She has a normal mood and affect. Her behavior is normal.  Nursing note and vitals reviewed.   ED Course  Procedures (including critical care time) Labs Review Labs Reviewed  BASIC METABOLIC PANEL - Abnormal; Notable for the following:    Glucose, Bld 120 (*)    BUN 23 (*)    Creatinine, Ser 1.11 (*)    GFR calc non Af Amer 45 (*)    GFR calc Af Amer 53 (*)    All other components within normal limits  CBC  TROPONIN I  TROPONIN I    Imaging Review Dg Chest 2 View  08/24/2015  CLINICAL DATA:  Left-sided chest pain with cardiac arrhythmia EXAM: CHEST  2 VIEW COMPARISON:  None. FINDINGS: Lungs are clear. Heart is upper normal in size with pulmonary vascularity within normal limits. No adenopathy. There is degenerative change in the thoracic spine. No pneumothorax. IMPRESSION: No edema or consolidation. Electronically Signed   By: Lowella Grip III M.D.   On: 08/24/2015 08:45   I have personally reviewed and evaluated these images and lab results as part of my medical decision-making.   EKG Interpretation   Date/Time:  Saturday August 24 2015 07:50:58 EST Ventricular Rate:  62 PR Interval:  188 QRS Duration: 84 QT Interval:  421 QTC Calculation: 427 R Axis:   -17 Text Interpretation:  Sinus rhythm Low voltage, precordial leads Abnormal  R-wave progression, early transition Left ventricular  hypertrophy No old  tracing to compare Confirmed by Khaleb Broz MD, DANIEL [id number]) on 08/24/2015  7:56:56 AM      MDM   Final diagnoses:  Chest pain, unspecified chest pain type    82 yo F with a chief complaint of left-sided chest pain. Completely atypical for ACS. Discussed with patient. She is concerned for possible ACS will obtain troponin EKG chest x-ray CBC BMP.  Patient's initial troponin was negative. Shared decision-making at bedside. Patient feels that her risk is low enough that she does not need a second troponin. We'll have her follow-up with her family doctor for further evaluation.  9:16 AM:  I have discussed the diagnosis/risks/treatment options with the patient and believe the pt to be eligible for discharge home to follow-up with PCP, cards. We also discussed returning to the ED  immediately if new or worsening sx occur. We discussed the sx which are most concerning (e.g., sudden worsening pain, sob, diaphoresis, vomiting that necessitate immediate return. Medications administered to the patient during their visit and any new prescriptions provided to the patient are listed below.  Medications given during this visit Medications  nitroGLYCERIN (NITROSTAT) SL tablet 0.4 mg (not administered)  aspirin chewable tablet 324 mg (243 mg Oral Given 08/24/15 0816)    New Prescriptions   No medications on file    The patient appears reasonably screen and/or stabilized for discharge and I doubt any other medical condition or other [facility] requiring further screening, evaluation, or treatment in the ED at this time prior to discharge.    Deno Etienne, DO 08/24/15 [phone]

## 2015-08-24 NOTE — ED Notes (Signed)
Pt placed on heart monitor, continuous pulse ox

## 2015-08-24 NOTE — ED Notes (Signed)
Patient reports this am hard an episode of left sided sharp chest pain, denies any other associated symptoms with this episode. Has had similar episodes the past several days with CP and left arm pain. No pain on arrival

## 2015-09-20 ENCOUNTER — Encounter: Payer: Self-pay | Admitting: *Deleted

## 2015-09-20 ENCOUNTER — Telehealth: Payer: Self-pay | Admitting: *Deleted

## 2015-09-20 NOTE — Telephone Encounter (Signed)
Pre-Visit Call completed with patient and chart updated.   Pre-Visit Info documented in Specialty Comments under [employer].    

## 2015-09-23 ENCOUNTER — Encounter: Payer: Self-pay | Admitting: Family Medicine

## 2015-09-23 ENCOUNTER — Ambulatory Visit (INDEPENDENT_AMBULATORY_CARE_PROVIDER_SITE_OTHER): Payer: Medicare Other | Admitting: Family Medicine

## 2015-09-23 VITALS — BP 155/85 | HR 62 | Temp 97.6°F | Resp 16 | Ht 63.0 in | Wt 204.4 lb

## 2015-09-23 DIAGNOSIS — R21 Rash and other nonspecific skin eruption: Secondary | ICD-10-CM

## 2015-09-23 DIAGNOSIS — M1990 Unspecified osteoarthritis, unspecified site: Secondary | ICD-10-CM

## 2015-09-23 DIAGNOSIS — Z23 Encounter for immunization: Secondary | ICD-10-CM | POA: Diagnosis not present

## 2015-09-23 DIAGNOSIS — E785 Hyperlipidemia, unspecified: Secondary | ICD-10-CM | POA: Diagnosis not present

## 2015-09-23 DIAGNOSIS — Z0000 Encounter for general adult medical examination without abnormal findings: Secondary | ICD-10-CM | POA: Diagnosis not present

## 2015-09-23 DIAGNOSIS — I10 Essential (primary) hypertension: Secondary | ICD-10-CM

## 2015-09-23 LAB — POCT URINALYSIS DIPSTICK
Bilirubin, UA: NEGATIVE
Blood, UA: NEGATIVE
Glucose, UA: NEGATIVE
Ketones, UA: NEGATIVE
Leukocytes, UA: NEGATIVE
Nitrite, UA: NEGATIVE
Protein, UA: NEGATIVE
Spec Grav, UA: >=1.03
Urobilinogen, UA: 0.2
pH, UA: 6

## 2015-09-23 MED ORDER — SIMVASTATIN 20 MG PO TABS: ORAL_TABLET | ORAL | Status: DC

## 2015-09-23 MED ORDER — AMLODIPINE BESYLATE 2.5 MG PO TABS: 2.5000 mg | ORAL_TABLET | Freq: Every day | ORAL | Status: DC

## 2015-09-23 MED ORDER — MELOXICAM 7.5 MG PO TABS: ORAL_TABLET | ORAL | Status: DC

## 2015-09-23 MED ORDER — TRIAMCINOLONE ACETONIDE 0.1 % EX CREA: 1.0000 "application " | TOPICAL_CREAM | Freq: Three times a day (TID) | CUTANEOUS | Status: DC

## 2015-09-23 NOTE — Patient Instructions (Signed)
Preventive Care for Adults, Female A healthy lifestyle and preventive care can promote health and wellness. Preventive health guidelines for women include the following key practices.  A routine yearly physical is a good way to check with your health care provider about your health and preventive screening. It is a chance to share any concerns and updates on your health and to receive a thorough exam.  Visit your dentist for a routine exam and preventive care every 6 months. Brush your teeth twice a day and floss once a day. Good oral hygiene prevents tooth decay and gum disease.  The frequency of eye exams is based on your age, health, family medical history, use of contact lenses, and other factors. Follow your health care provider's recommendations for frequency of eye exams.  Eat a healthy diet. Foods like vegetables, fruits, whole grains, low-fat dairy products, and lean protein foods contain the nutrients you need without too many calories. Decrease your intake of foods high in solid fats, added sugars, and salt. Eat the right amount of calories for you.Get information about a proper diet from your health care provider, if necessary.  Regular physical exercise is one of the most important things you can do for your health. Most adults should get at least 150 minutes of moderate-intensity exercise (any activity that increases your heart rate and causes you to sweat) each week. In addition, most adults need muscle-strengthening exercises on 2 or more days a week.  Maintain a healthy weight. The body mass index (BMI) is a screening tool to identify possible weight problems. It provides an estimate of body fat based on height and weight. Your health care provider can find your BMI and can help you achieve or maintain a healthy weight.For adults 20 years and older:  A BMI below 18.5 is considered underweight.  A BMI of 18.5 to 24.9 is normal.  A BMI of 25 to 29.9 is considered overweight.  A  BMI of 30 and above is considered obese.  Maintain normal blood lipids and cholesterol levels by exercising and minimizing your intake of saturated fat. Eat a balanced diet with plenty of fruit and vegetables. Blood tests for lipids and cholesterol should begin at age 45 and be repeated every 5 years. If your lipid or cholesterol levels are high, you are over 50, or you are at high risk for heart disease, you may need your cholesterol levels checked more frequently.Ongoing high lipid and cholesterol levels should be treated with medicines if diet and exercise are not working.  If you smoke, find out from your health care provider how to quit. If you do not use tobacco, do not start.  Lung cancer screening is recommended for adults aged 45-80 years who are at high risk for developing lung cancer because of a history of smoking. A yearly low-dose CT scan of the lungs is recommended for people who have at least a 30-pack-year history of smoking and are a current smoker or have quit within the past 15 years. A pack year of smoking is smoking an average of 1 pack of cigarettes a day for 1 year (for example: 1 pack a day for 30 years or 2 packs a day for 15 years). Yearly screening should continue until the smoker has stopped smoking for at least 15 years. Yearly screening should be stopped for people who develop a health problem that would prevent them from having lung cancer treatment.  If you are pregnant, do not drink alcohol. If you are  breastfeeding, be very cautious about drinking alcohol. If you are not pregnant and choose to drink alcohol, do not have more than 1 drink per day. One drink is considered to be 12 ounces (355 mL) of beer, 5 ounces (148 mL) of wine, or 1.5 ounces (44 mL) of liquor.  Avoid use of street drugs. Do not share needles with anyone. Ask for help if you need support or instructions about stopping the use of drugs.  High blood pressure causes heart disease and increases the risk  of stroke. Your blood pressure should be checked at least every 1 to 2 years. Ongoing high blood pressure should be treated with medicines if weight loss and exercise do not work.  If you are 55-79 years old, ask your health care provider if you should take aspirin to prevent strokes.  Diabetes screening is done by taking a blood sample to check your blood glucose level after you have not eaten for a certain period of time (fasting). If you are not overweight and you do not have risk factors for diabetes, you should be screened once every 3 years starting at age 45. If you are overweight or obese and you are 40-70 years of age, you should be screened for diabetes every year as part of your cardiovascular risk assessment.  Breast cancer screening is essential preventive care for women. You should practice "breast self-awareness." This means understanding the normal appearance and feel of your breasts and may include breast self-examination. Any changes detected, no matter how small, should be reported to a health care provider. Women in their 20s and 30s should have a clinical breast exam (CBE) by a health care provider as part of a regular health exam every 1 to 3 years. After age 40, women should have a CBE every year. Starting at age 40, women should consider having a mammogram (breast X-ray test) every year. Women who have a family history of breast cancer should talk to their health care provider about genetic screening. Women at a high risk of breast cancer should talk to their health care providers about having an MRI and a mammogram every year.  Breast cancer gene (BRCA)-related cancer risk assessment is recommended for women who have family members with BRCA-related cancers. BRCA-related cancers include breast, ovarian, tubal, and peritoneal cancers. Having family members with these cancers may be associated with an increased risk for harmful changes (mutations) in the breast cancer genes BRCA1 and  BRCA2. Results of the assessment will determine the need for genetic counseling and BRCA1 and BRCA2 testing.  Your health care provider may recommend that you be screened regularly for cancer of the pelvic organs (ovaries, uterus, and vagina). This screening involves a pelvic examination, including checking for microscopic changes to the surface of your cervix (Pap test). You may be encouraged to have this screening done every 3 years, beginning at age 21.  For women ages 30-65, health care providers may recommend pelvic exams and Pap testing every 3 years, or they may recommend the Pap and pelvic exam, combined with testing for human papilloma virus (HPV), every 5 years. Some types of HPV increase your risk of cervical cancer. Testing for HPV may also be done on women of any age with unclear Pap test results.  Other health care providers may not recommend any screening for nonpregnant women who are considered low risk for pelvic cancer and who do not have symptoms. Ask your health care provider if a screening pelvic exam is right for   you.  If you have had past treatment for cervical cancer or a condition that could lead to cancer, you need Pap tests and screening for cancer for at least 20 years after your treatment. If Pap tests have been discontinued, your risk factors (such as having a new sexual partner) need to be reassessed to determine if screening should resume. Some women have medical problems that increase the chance of getting cervical cancer. In these cases, your health care provider may recommend more frequent screening and Pap tests.  Colorectal cancer can be detected and often prevented. Most routine colorectal cancer screening begins at the age of 50 years and continues through age 75 years. However, your health care provider may recommend screening at an earlier age if you have risk factors for colon cancer. On a yearly basis, your health care provider may provide home test kits to check  for hidden blood in the stool. Use of a small camera at the end of a tube, to directly examine the colon (sigmoidoscopy or colonoscopy), can detect the earliest forms of colorectal cancer. Talk to your health care provider about this at age 50, when routine screening begins. Direct exam of the colon should be repeated every 5-10 years through age 75 years, unless early forms of precancerous polyps or small growths are found.  People who are at an increased risk for hepatitis B should be screened for this virus. You are considered at high risk for hepatitis B if:  You were born in a country where hepatitis B occurs often. Talk with your health care provider about which countries are considered high risk.  Your parents were born in a high-risk country and you have not received a shot to protect against hepatitis B (hepatitis B vaccine).  You have HIV or AIDS.  You use needles to inject street drugs.  You live with, or have sex with, someone who has hepatitis B.  You get hemodialysis treatment.  You take certain medicines for conditions like cancer, organ transplantation, and autoimmune conditions.  Hepatitis C blood testing is recommended for all people born from 1945 through 1965 and any individual with known risks for hepatitis C.  Practice safe sex. Use condoms and avoid high-risk sexual practices to reduce the spread of sexually transmitted infections (STIs). STIs include gonorrhea, chlamydia, syphilis, trichomonas, herpes, HPV, and human immunodeficiency virus (HIV). Herpes, HIV, and HPV are viral illnesses that have no cure. They can result in disability, cancer, and death.  You should be screened for sexually transmitted illnesses (STIs) including gonorrhea and chlamydia if:  You are sexually active and are younger than 24 years.  You are older than 24 years and your health care provider tells you that you are at risk for this type of infection.  Your sexual activity has changed  since you were last screened and you are at an increased risk for chlamydia or gonorrhea. Ask your health care provider if you are at risk.  If you are at risk of being infected with HIV, it is recommended that you take a prescription medicine daily to prevent HIV infection. This is called preexposure prophylaxis (PrEP). You are considered at risk if:  You are sexually active and do not regularly use condoms or know the HIV status of your partner(s).  You take drugs by injection.  You are sexually active with a partner who has HIV.  Talk with your health care provider about whether you are at high risk of being infected with HIV. If   you choose to begin PrEP, you should first be tested for HIV. You should then be tested every 3 months for as long as you are taking PrEP.  Osteoporosis is a disease in which the bones lose minerals and strength with aging. This can result in serious bone fractures or breaks. The risk of osteoporosis can be identified using a bone density scan. Women ages 67 years and over and women at risk for fractures or osteoporosis should discuss screening with their health care providers. Ask your health care provider whether you should take a calcium supplement or vitamin D to reduce the rate of osteoporosis.  Menopause can be associated with physical symptoms and risks. Hormone replacement therapy is available to decrease symptoms and risks. You should talk to your health care provider about whether hormone replacement therapy is right for you.  Use sunscreen. Apply sunscreen liberally and repeatedly throughout the day. You should seek shade when your shadow is shorter than you. Protect yourself by wearing long sleeves, pants, a wide-brimmed hat, and sunglasses year round, whenever you are outdoors.  Once a month, do a whole body skin exam, using a mirror to look at the skin on your back. Tell your health care provider of new moles, moles that have irregular borders, moles that  are larger than a pencil eraser, or moles that have changed in shape or color.  Stay current with required vaccines (immunizations).  Influenza vaccine. All adults should be immunized every year.  Tetanus, diphtheria, and acellular pertussis (Td, Tdap) vaccine. Pregnant women should receive 1 dose of Tdap vaccine during each pregnancy. The dose should be obtained regardless of the length of time since the last dose. Immunization is preferred during the 27th-36th week of gestation. An adult who has not previously received Tdap or who does not know her vaccine status should receive 1 dose of Tdap. This initial dose should be followed by tetanus and diphtheria toxoids (Td) booster doses every 10 years. Adults with an unknown or incomplete history of completing a 3-dose immunization series with Td-containing vaccines should begin or complete a primary immunization series including a Tdap dose. Adults should receive a Td booster every 10 years.  Varicella vaccine. An adult without evidence of immunity to varicella should receive 2 doses or a second dose if she has previously received 1 dose. Pregnant females who do not have evidence of immunity should receive the first dose after pregnancy. This first dose should be obtained before leaving the health care facility. The second dose should be obtained 4-8 weeks after the first dose.  Human papillomavirus (HPV) vaccine. Females aged 13-26 years who have not received the vaccine previously should obtain the 3-dose series. The vaccine is not recommended for use in pregnant females. However, pregnancy testing is not needed before receiving a dose. If a female is found to be pregnant after receiving a dose, no treatment is needed. In that case, the remaining doses should be delayed until after the pregnancy. Immunization is recommended for any person with an immunocompromised condition through the age of 61 years if she did not get any or all doses earlier. During the  3-dose series, the second dose should be obtained 4-8 weeks after the first dose. The third dose should be obtained 24 weeks after the first dose and 16 weeks after the second dose.  Zoster vaccine. One dose is recommended for adults aged 30 years or older unless certain conditions are present.  Measles, mumps, and rubella (MMR) vaccine. Adults born  before 1957 generally are considered immune to measles and mumps. Adults born in 1957 or later should have 1 or more doses of MMR vaccine unless there is a contraindication to the vaccine or there is laboratory evidence of immunity to each of the three diseases. A routine second dose of MMR vaccine should be obtained at least 28 days after the first dose for students attending postsecondary schools, health care workers, or international travelers. People who received inactivated measles vaccine or an unknown type of measles vaccine during 1963-1967 should receive 2 doses of MMR vaccine. People who received inactivated mumps vaccine or an unknown type of mumps vaccine before 1979 and are at high risk for mumps infection should consider immunization with 2 doses of MMR vaccine. For females of childbearing age, rubella immunity should be determined. If there is no evidence of immunity, females who are not pregnant should be vaccinated. If there is no evidence of immunity, females who are pregnant should delay immunization until after pregnancy. Unvaccinated health care workers born before 1957 who lack laboratory evidence of measles, mumps, or rubella immunity or laboratory confirmation of disease should consider measles and mumps immunization with 2 doses of MMR vaccine or rubella immunization with 1 dose of MMR vaccine.  Pneumococcal 13-valent conjugate (PCV13) vaccine. When indicated, a person who is uncertain of his immunization history and has no record of immunization should receive the PCV13 vaccine. All adults 65 years of age and older should receive this  vaccine. An adult aged 19 years or older who has certain medical conditions and has not been previously immunized should receive 1 dose of PCV13 vaccine. This PCV13 should be followed with a dose of pneumococcal polysaccharide (PPSV23) vaccine. Adults who are at high risk for pneumococcal disease should obtain the PPSV23 vaccine at least 8 weeks after the dose of PCV13 vaccine. Adults older than 80 years of age who have normal immune system function should obtain the PPSV23 vaccine dose at least 1 year after the dose of PCV13 vaccine.  Pneumococcal polysaccharide (PPSV23) vaccine. When PCV13 is also indicated, PCV13 should be obtained first. All adults aged 65 years and older should be immunized. An adult younger than age 65 years who has certain medical conditions should be immunized. Any person who resides in a nursing home or long-term care facility should be immunized. An adult smoker should be immunized. People with an immunocompromised condition and certain other conditions should receive both PCV13 and PPSV23 vaccines. People with human immunodeficiency virus (HIV) infection should be immunized as soon as possible after diagnosis. Immunization during chemotherapy or radiation therapy should be avoided. Routine use of PPSV23 vaccine is not recommended for American Indians, [place] Natives, or people younger than 65 years unless there are medical conditions that require PPSV23 vaccine. When indicated, people who have unknown immunization and have no record of immunization should receive PPSV23 vaccine. One-time revaccination 5 years after the first dose of PPSV23 is recommended for people aged 19-64 years who have chronic kidney failure, nephrotic syndrome, asplenia, or immunocompromised conditions. People who received 1-2 doses of PPSV23 before age 65 years should receive another dose of PPSV23 vaccine at age 65 years or later if at least 5 years have passed since the previous dose. Doses of PPSV23 are not  needed for people immunized with PPSV23 at or after age 65 years.  Meningococcal vaccine. Adults with asplenia or persistent complement component deficiencies should receive 2 doses of quadrivalent meningococcal conjugate (MenACWY-D) vaccine. The doses should be obtained   at least 2 months apart. Microbiologists working with certain meningococcal bacteria, [place] recruits, people at risk during an outbreak, and people who travel to or live in countries with a high rate of meningitis should be immunized. A first-year college student up through age 34 years who is living in a residence hall should receive a dose if she did not receive a dose on or after her 16th birthday. Adults who have certain high-risk conditions should receive one or more doses of vaccine.  Hepatitis A vaccine. Adults who wish to be protected from this disease, have certain high-risk conditions, work with hepatitis A-infected animals, work in hepatitis A research labs, or travel to or work in countries with a high rate of hepatitis A should be immunized. Adults who were previously unvaccinated and who anticipate close contact with an international adoptee during the first 60 days after arrival in the [place] from a country with a high rate of hepatitis A should be immunized.  Hepatitis B vaccine. Adults who wish to be protected from this disease, have certain high-risk conditions, may be exposed to blood or other infectious body fluids, are household contacts or sex partners of hepatitis B positive people, are clients or workers in certain care facilities, or travel to or work in countries with a high rate of hepatitis B should be immunized.  Haemophilus influenzae type b (Hib) vaccine. A previously unvaccinated person with asplenia or sickle cell disease or having a scheduled splenectomy should receive 1 dose of Hib vaccine. Regardless of previous immunization, a recipient of a hematopoietic stem cell transplant should receive a  3-dose series 6-12 months after her successful transplant. Hib vaccine is not recommended for adults with HIV infection. Preventive Services / Frequency Ages 35 to 4 years  Blood pressure check.** / Every 3-5 years.  Lipid and cholesterol check.** / Every 5 years beginning at age 60.  Clinical breast exam.** / Every 3 years for women in their 71s and 10s.  BRCA-related cancer risk assessment.** / For women who have family members with a BRCA-related cancer (breast, ovarian, tubal, or peritoneal cancers).  Pap test.** / Every 2 years from ages 76 through 26. Every 3 years starting at age 61 through age 76 or 93 with a history of 3 consecutive normal Pap tests.  HPV screening.** / Every 3 years from ages 37 through ages 60 to 51 with a history of 3 consecutive normal Pap tests.  Hepatitis C blood test.** / For any individual with known risks for hepatitis C.  Skin self-exam. / Monthly.  Influenza vaccine. / Every year.  Tetanus, diphtheria, and acellular pertussis (Tdap, Td) vaccine.** / Consult your health care provider. Pregnant women should receive 1 dose of Tdap vaccine during each pregnancy. 1 dose of Td every 10 years.  Varicella vaccine.** / Consult your health care provider. Pregnant females who do not have evidence of immunity should receive the first dose after pregnancy.  HPV vaccine. / 3 doses over 6 months, if 93 and younger. The vaccine is not recommended for use in pregnant females. However, pregnancy testing is not needed before receiving a dose.  Measles, mumps, rubella (MMR) vaccine.** / You need at least 1 dose of MMR if you were born in 1957 or later. You may also need a 2nd dose. For females of childbearing age, rubella immunity should be determined. If there is no evidence of immunity, females who are not pregnant should be vaccinated. If there is no evidence of immunity, females who are  pregnant should delay immunization until after pregnancy.  Pneumococcal  13-valent conjugate (PCV13) vaccine.** / Consult your health care provider.  Pneumococcal polysaccharide (PPSV23) vaccine.** / 1 to 2 doses if you smoke cigarettes or if you have certain conditions.  Meningococcal vaccine.** / 1 dose if you are age 68 to 8 years and a Market researcher living in a residence hall, or have one of several medical conditions, you need to get vaccinated against meningococcal disease. You may also need additional booster doses.  Hepatitis A vaccine.** / Consult your health care provider.  Hepatitis B vaccine.** / Consult your health care provider.  Haemophilus influenzae type b (Hib) vaccine.** / Consult your health care provider. Ages 7 to 53 years  Blood pressure check.** / Every year.  Lipid and cholesterol check.** / Every 5 years beginning at age 25 years.  Lung cancer screening. / Every year if you are aged 11-80 years and have a 30-pack-year history of smoking and currently smoke or have quit within the past 15 years. Yearly screening is stopped once you have quit smoking for at least 15 years or develop a health problem that would prevent you from having lung cancer treatment.  Clinical breast exam.** / Every year after age 48 years.  BRCA-related cancer risk assessment.** / For women who have family members with a BRCA-related cancer (breast, ovarian, tubal, or peritoneal cancers).  Mammogram.** / Every year beginning at age 41 years and continuing for as long as you are in good health. Consult with your health care provider.  Pap test.** / Every 3 years starting at age 65 years through age 37 or 70 years with a history of 3 consecutive normal Pap tests.  HPV screening.** / Every 3 years from ages 72 years through ages 60 to 40 years with a history of 3 consecutive normal Pap tests.  Fecal occult blood test (FOBT) of stool. / Every year beginning at age 21 years and continuing until age 5 years. You may not need to do this test if you get  a colonoscopy every 10 years.  Flexible sigmoidoscopy or colonoscopy.** / Every 5 years for a flexible sigmoidoscopy or every 10 years for a colonoscopy beginning at age 35 years and continuing until age 48 years.  Hepatitis C blood test.** / For all people born from [birth year] through 1965 and any individual with known risks for hepatitis C.  Skin self-exam. / Monthly.  Influenza vaccine. / Every year.  Tetanus, diphtheria, and acellular pertussis (Tdap/Td) vaccine.** / Consult your health care provider. Pregnant women should receive 1 dose of Tdap vaccine during each pregnancy. 1 dose of Td every 10 years.  Varicella vaccine.** / Consult your health care provider. Pregnant females who do not have evidence of immunity should receive the first dose after pregnancy.  Zoster vaccine.** / 1 dose for adults aged 30 years or older.  Measles, mumps, rubella (MMR) vaccine.** / You need at least 1 dose of MMR if you were born in 1957 or later. You may also need a second dose. For females of childbearing age, rubella immunity should be determined. If there is no evidence of immunity, females who are not pregnant should be vaccinated. If there is no evidence of immunity, females who are pregnant should delay immunization until after pregnancy.  Pneumococcal 13-valent conjugate (PCV13) vaccine.** / Consult your health care provider.  Pneumococcal polysaccharide (PPSV23) vaccine.** / 1 to 2 doses if you smoke cigarettes or if you have certain conditions.  Meningococcal vaccine.** /  Consult your health care provider.  Hepatitis A vaccine.** / Consult your health care provider.  Hepatitis B vaccine.** / Consult your health care provider.  Haemophilus influenzae type b (Hib) vaccine.** / Consult your health care provider. Ages 64 years and over  Blood pressure check.** / Every year.  Lipid and cholesterol check.** / Every 5 years beginning at age 23 years.  Lung cancer screening. / Every year if you  are aged 16-80 years and have a 30-pack-year history of smoking and currently smoke or have quit within the past 15 years. Yearly screening is stopped once you have quit smoking for at least 15 years or develop a health problem that would prevent you from having lung cancer treatment.  Clinical breast exam.** / Every year after age 74 years.  BRCA-related cancer risk assessment.** / For women who have family members with a BRCA-related cancer (breast, ovarian, tubal, or peritoneal cancers).  Mammogram.** / Every year beginning at age 44 years and continuing for as long as you are in good health. Consult with your health care provider.  Pap test.** / Every 3 years starting at age 58 years through age 22 or 39 years with 3 consecutive normal Pap tests. Testing can be stopped between 65 and 70 years with 3 consecutive normal Pap tests and no abnormal Pap or HPV tests in the past 10 years.  HPV screening.** / Every 3 years from ages 64 years through ages 70 or 61 years with a history of 3 consecutive normal Pap tests. Testing can be stopped between 65 and 70 years with 3 consecutive normal Pap tests and no abnormal Pap or HPV tests in the past 10 years.  Fecal occult blood test (FOBT) of stool. / Every year beginning at age 40 years and continuing until age 27 years. You may not need to do this test if you get a colonoscopy every 10 years.  Flexible sigmoidoscopy or colonoscopy.** / Every 5 years for a flexible sigmoidoscopy or every 10 years for a colonoscopy beginning at age 7 years and continuing until age 32 years.  Hepatitis C blood test.** / For all people born from [birth year] through 1965 and any individual with known risks for hepatitis C.  Osteoporosis screening.** / A one-time screening for women ages 30 years and over and women at risk for fractures or osteoporosis.  Skin self-exam. / Monthly.  Influenza vaccine. / Every year.  Tetanus, diphtheria, and acellular pertussis (Tdap/Td)  vaccine.** / 1 dose of Td every 10 years.  Varicella vaccine.** / Consult your health care provider.  Zoster vaccine.** / 1 dose for adults aged 35 years or older.  Pneumococcal 13-valent conjugate (PCV13) vaccine.** / Consult your health care provider.  Pneumococcal polysaccharide (PPSV23) vaccine.** / 1 dose for all adults aged 46 years and older.  Meningococcal vaccine.** / Consult your health care provider.  Hepatitis A vaccine.** / Consult your health care provider.  Hepatitis B vaccine.** / Consult your health care provider.  Haemophilus influenzae type b (Hib) vaccine.** / Consult your health care provider. ** Family history and personal history of risk and conditions may change your health care provider's recommendations.   This information is not intended to replace advice given to you by your health care provider. Make sure you discuss any questions you have with your health care provider.   Document Released: 08/25/2001 Document Revised: 07/20/2014 Document Reviewed: 11/24/2010 Elsevier Interactive Patient Education [employer].

## 2015-09-23 NOTE — Progress Notes (Signed)
Pre visit review using our clinic review tool, if applicable. No additional management support is needed unless otherwise documented below in the visit note. 

## 2015-09-23 NOTE — Progress Notes (Signed)
Subjective:   Helen [place] is a 82 y.o. female who presents for Medicare Annual (Subsequent) preventive examination.  Review of Systems:   Review of Systems  Constitutional: Negative for activity change, appetite change and fatigue.  HENT: Negative for hearing loss, congestion, tinnitus and ear discharge.   Eyes: Negative for visual disturbance (see optho q1y -- vision corrected to 20/20 with glasses).  Respiratory: Negative for cough, chest tightness and shortness of breath.   Cardiovascular: Negative for chest pain, palpitations and leg swelling.  Gastrointestinal: Negative for abdominal pain, diarrhea, constipation and abdominal distention.  Genitourinary: Negative for urgency, frequency, decreased urine volume and difficulty urinating.  Musculoskeletal: Negative for back pain, arthralgias and gait problem.  Skin: Negative for color change, pallor and rash.  Neurological: Negative for dizziness, light-headedness, numbness and headaches.  Hematological: Negative for adenopathy. Does not bruise/bleed easily.  Psychiatric/Behavioral: Negative for suicidal ideas, confusion, sleep disturbance, self-injury, dysphoric mood, decreased concentration and agitation.  Pt is able to read and write and can do all ADLs No risk for falling No abuse/ violence in home           Objective:     Vitals: BP 155/85 mmHg  Pulse 62  Temp(Src) 97.6 F (36.4 C) (Oral)  Resp 16  Ht 5\' 3"  (1.6 m)  Wt 204 lb 6.4 oz (92.715 kg)  BMI 36.22 kg/m2  SpO2 95% {female exam, choose systems:17926 Tobacco History  Smoking status  . Never Smoker   Smokeless tobacco  . Never Used     Counseling given: Not Answered   Past Medical History  Diagnosis Date  . Hyperlipidemia   . Hypertension   . Arthritis   . Hemorrhoids   . Heart murmur   . Osteoporosis   . Macular degeneration    Past Surgical History  Procedure Laterality Date  . Abdominal hysterectomy    . Breast biopsy    . Knee  arthroscopy    . Leep     Family History  Problem Relation Age of Onset  . Arthritis    . Heart disease    . Parkinsonism Mother   . Rheum arthritis Mother   . Diabetes     History  Sexual Activity  . Sexual Activity: Not on file    Outpatient Encounter Prescriptions as of 09/23/2015  Medication Sig  . amLODipine (NORVASC) 2.5 MG tablet Take 1 tablet (2.5 mg total) by mouth daily.  Marland Kitchen aspirin 81 MG tablet Take 81 mg by mouth daily.    . meloxicam (MOBIC) 7.5 MG tablet TAKE ONE TABLET BY MOUTH ONCE DAILY AS NEEDED  . simvastatin (ZOCOR) 20 MG tablet TAKE ONE TABLET BY MOUTH AT BEDTIME  . sotalol (BETAPACE) 120 MG tablet 120 mg 2 (two) times daily.  Marland Kitchen triamcinolone cream (KENALOG) 0.1 % Apply 1 application topically 3 (three) times daily.  . [DISCONTINUED] amLODipine (NORVASC) 2.5 MG tablet Take 1 tablet (2.5 mg total) by mouth daily.  . [DISCONTINUED] meloxicam (MOBIC) 7.5 MG tablet TAKE ONE TABLET BY MOUTH ONCE DAILY AS NEEDED  . [DISCONTINUED] simvastatin (ZOCOR) 20 MG tablet TAKE ONE TABLET BY MOUTH AT BEDTIME  . [DISCONTINUED] triamcinolone cream (KENALOG) 0.1 % Apply 1 application topically.   No facility-administered encounter medications on file as of 09/23/2015.    Activities of Daily Living In your present state of health, do you have any difficulty performing the following activities: 09/23/2015  Hearing? N  Vision? N  Difficulty concentrating or making decisions? N  Walking  or climbing stairs? N  Dressing or bathing? N  Doing errands, shopping? N    Patient Care Team: Rosalita Chessman, DO as PCP - General (Family Medicine) Georgeanna Harrison. Quillian Quince, DO as Consulting Physician (Cardiology)    Assessment:    See AVS Exercise Activities and Dietary recommendations-     Goals    None     Fall Risk Fall Risk  09/23/2015 04/02/2014 03/06/2013  Falls in the past year? No No No   Depression Screen PHQ 2/9 Scores 09/23/2015 04/02/2014 03/06/2013  PHQ - 2 Score 1 0 0      Cognitive Testing mmse 30/30  Immunization History  Administered Date(s) Administered  . Pneumococcal Polysaccharide-23 07/30/2010   Screening Tests Health Maintenance  Topic Date Due  . TETANUS/TDAP  02/25/1953  . INFLUENZA VACCINE  10/11/2015 (Originally 02/11/2015)  . DEXA SCAN  12/13/2015 (Originally 02/26/1999)  . PNA vac Low Risk Adult (2 of 2 - PCV13) 12/13/2015 (Originally 07/31/2011)  . ZOSTAVAX  Addressed      Plan:    see AVS During the course of the visit the patient was educated and counseled about the following appropriate screening and preventive services:   Vaccines to include Pneumoccal, Influenza, Hepatitis B, Td, Zostavax, HCV  Electrocardiogram  Cardiovascular Disease  Colorectal cancer screening  Bone density screening  Diabetes screening  Glaucoma screening  Mammography/PAP  Nutrition counseling   Patient Instructions (the written plan) was given to the patient.  1. Rash and nonspecific skin eruption  - triamcinolone cream (KENALOG) 0.1 %; Apply 1 application topically 3 (three) times daily.  Dispense: 80 g; Refill: 5  2. Hyperlipidemia Check labs - simvastatin (ZOCOR) 20 MG tablet; TAKE ONE TABLET BY MOUTH AT BEDTIME  Dispense: 90 tablet; Refill: 3  3. Essential hypertension stable - amLODipine (NORVASC) 2.5 MG tablet; Take 1 tablet (2.5 mg total) by mouth daily.  Dispense: 90 tablet; Refill: 3  4. Osteoarthritis, unspecified osteoarthritis type, unspecified site   - meloxicam (MOBIC) 7.5 MG tablet; TAKE ONE TABLET BY MOUTH ONCE DAILY AS NEEDED  Dispense: 90 tablet; Refill: 3  5. Preventative health care    Garnet Koyanagi, DO  09/23/2015

## 2015-09-24 LAB — CBC WITH DIFFERENTIAL/PLATELET
Basophils Absolute: 0 10*3/uL (ref 0.0–0.1)
Basophils Relative: 0.4 % (ref 0.0–3.0)
Eosinophils Absolute: 0.4 10*3/uL (ref 0.0–0.7)
Eosinophils Relative: 5.2 % — ABNORMAL HIGH (ref 0.0–5.0)
HCT: 44.8 % (ref 36.0–46.0)
Hemoglobin: 14.8 g/dL (ref 12.0–15.0)
Lymphocytes Relative: 25.8 % (ref 12.0–46.0)
Lymphs Abs: 2.2 10*3/uL (ref 0.7–4.0)
MCHC: 32.9 g/dL (ref 30.0–36.0)
MCV: 92.2 fl (ref 78.0–100.0)
Monocytes Absolute: 0.8 10*3/uL (ref 0.1–1.0)
Monocytes Relative: 9.1 % (ref 3.0–12.0)
Neutro Abs: 5.1 10*3/uL (ref 1.4–7.7)
Neutrophils Relative %: 59.5 % (ref 43.0–77.0)
Platelets: 195 10*3/uL (ref 150.0–400.0)
RBC: 4.86 Mil/uL (ref 3.87–5.11)
RDW: 14.4 % (ref 11.5–15.5)
WBC: 8.6 10*3/uL (ref 4.0–10.5)

## 2015-09-24 LAB — COMPREHENSIVE METABOLIC PANEL
ALT: 20 U/L (ref 0–35)
AST: 23 U/L (ref 0–37)
Albumin: 4.2 g/dL (ref 3.5–5.2)
Alkaline Phosphatase: 77 U/L (ref 39–117)
BUN: 26 mg/dL — ABNORMAL HIGH (ref 6–23)
CO2: 26 mEq/L (ref 19–32)
Calcium: 9.5 mg/dL (ref 8.4–10.5)
Chloride: 110 mEq/L (ref 96–112)
Creatinine, Ser: 1.17 mg/dL (ref 0.40–1.20)
GFR: 47.12 mL/min — ABNORMAL LOW (ref >60.00)
Glucose, Bld: 100 mg/dL — ABNORMAL HIGH (ref 70–99)
Potassium: 4.4 mEq/L (ref 3.5–5.1)
Sodium: 145 mEq/L (ref 135–145)
Total Bilirubin: 0.7 mg/dL (ref 0.2–1.2)
Total Protein: 7.3 g/dL (ref 6.0–8.3)

## 2015-09-24 LAB — LIPID PANEL
Cholesterol: 151 mg/dL (ref 0–200)
HDL: 72.5 mg/dL (ref >39.00)
LDL Cholesterol: 63 mg/dL (ref 0–99)
NonHDL: 78.79
Total CHOL/HDL Ratio: 2
Triglycerides: 80 mg/dL (ref 0.0–149.0)
VLDL: 16 mg/dL (ref 0.0–40.0)

## 2015-10-10 DIAGNOSIS — H353222 Exudative age-related macular degeneration, left eye, with inactive choroidal neovascularization: Secondary | ICD-10-CM | POA: Diagnosis not present

## 2015-10-10 DIAGNOSIS — H353111 Nonexudative age-related macular degeneration, right eye, early dry stage: Secondary | ICD-10-CM | POA: Diagnosis not present

## 2015-10-17 DIAGNOSIS — R072 Precordial pain: Secondary | ICD-10-CM | POA: Diagnosis not present

## 2015-10-17 DIAGNOSIS — I480 Paroxysmal atrial fibrillation: Secondary | ICD-10-CM | POA: Diagnosis not present

## 2015-10-17 DIAGNOSIS — Z6836 Body mass index (BMI) 36.0-36.9, adult: Secondary | ICD-10-CM | POA: Diagnosis not present

## 2015-10-17 DIAGNOSIS — E7800 Pure hypercholesterolemia, unspecified: Secondary | ICD-10-CM | POA: Diagnosis not present

## 2015-10-17 DIAGNOSIS — I10 Essential (primary) hypertension: Secondary | ICD-10-CM | POA: Diagnosis not present

## 2015-11-12 DIAGNOSIS — M7062 Trochanteric bursitis, left hip: Secondary | ICD-10-CM | POA: Diagnosis not present

## 2015-11-12 DIAGNOSIS — M1712 Unilateral primary osteoarthritis, left knee: Secondary | ICD-10-CM | POA: Diagnosis not present

## 2015-12-05 DIAGNOSIS — H353221 Exudative age-related macular degeneration, left eye, with active choroidal neovascularization: Secondary | ICD-10-CM | POA: Diagnosis not present

## 2016-01-16 DIAGNOSIS — L84 Corns and callosities: Secondary | ICD-10-CM | POA: Diagnosis not present

## 2016-01-16 DIAGNOSIS — M79672 Pain in left foot: Secondary | ICD-10-CM | POA: Diagnosis not present

## 2016-01-16 DIAGNOSIS — M2142 Flat foot [pes planus] (acquired), left foot: Secondary | ICD-10-CM | POA: Diagnosis not present

## 2016-01-30 DIAGNOSIS — H353221 Exudative age-related macular degeneration, left eye, with active choroidal neovascularization: Secondary | ICD-10-CM | POA: Diagnosis not present

## 2016-03-19 DIAGNOSIS — H353221 Exudative age-related macular degeneration, left eye, with active choroidal neovascularization: Secondary | ICD-10-CM | POA: Diagnosis not present

## 2016-04-07 DIAGNOSIS — M1712 Unilateral primary osteoarthritis, left knee: Secondary | ICD-10-CM | POA: Diagnosis not present

## 2016-04-16 DIAGNOSIS — H353221 Exudative age-related macular degeneration, left eye, with active choroidal neovascularization: Secondary | ICD-10-CM | POA: Diagnosis not present

## 2016-04-16 DIAGNOSIS — H353111 Nonexudative age-related macular degeneration, right eye, early dry stage: Secondary | ICD-10-CM | POA: Diagnosis not present

## 2016-05-11 DIAGNOSIS — L570 Actinic keratosis: Secondary | ICD-10-CM | POA: Diagnosis not present

## 2016-05-11 DIAGNOSIS — L820 Inflamed seborrheic keratosis: Secondary | ICD-10-CM | POA: Diagnosis not present

## 2016-05-11 DIAGNOSIS — Z85828 Personal history of other malignant neoplasm of skin: Secondary | ICD-10-CM | POA: Diagnosis not present

## 2016-05-11 DIAGNOSIS — L578 Other skin changes due to chronic exposure to nonionizing radiation: Secondary | ICD-10-CM | POA: Diagnosis not present

## 2016-06-11 DIAGNOSIS — H353221 Exudative age-related macular degeneration, left eye, with active choroidal neovascularization: Secondary | ICD-10-CM | POA: Diagnosis not present

## 2016-06-17 DIAGNOSIS — M25512 Pain in left shoulder: Secondary | ICD-10-CM | POA: Diagnosis not present

## 2016-06-17 DIAGNOSIS — M79672 Pain in left foot: Secondary | ICD-10-CM | POA: Diagnosis not present

## 2016-06-17 DIAGNOSIS — M778 Other enthesopathies, not elsewhere classified: Secondary | ICD-10-CM | POA: Diagnosis not present

## 2016-06-17 DIAGNOSIS — M2142 Flat foot [pes planus] (acquired), left foot: Secondary | ICD-10-CM | POA: Diagnosis not present

## 2016-07-23 DIAGNOSIS — H353221 Exudative age-related macular degeneration, left eye, with active choroidal neovascularization: Secondary | ICD-10-CM | POA: Diagnosis not present

## 2016-08-14 ENCOUNTER — Telehealth: Payer: Self-pay | Admitting: Family Medicine

## 2016-08-14 DIAGNOSIS — M1990 Unspecified osteoarthritis, unspecified site: Secondary | ICD-10-CM

## 2016-08-14 MED ORDER — MELOXICAM 7.5 MG PO TABS: ORAL_TABLET | ORAL | 0 refills | Status: DC

## 2016-08-14 NOTE — Telephone Encounter (Signed)
Advise as last office visit 09/23/2015 Last refill #90 with 3 refills on 09/23/2015 No Upcoming scheduled appt.

## 2016-08-14 NOTE — Telephone Encounter (Signed)
Ok to send in #90  Ov needed

## 2016-08-14 NOTE — Telephone Encounter (Signed)
Sent in as instructed and informed to scheduled OV for further refills.

## 2016-08-14 NOTE — Telephone Encounter (Signed)
Relation to [id number] Call back number:[phone] Pharmacy: [place], [place] [phone] (Phone) [phone] (Fax)      Reason for call:  Patient requesting a 3 month supply of meloxicam (MOBIC) 7.5 MG tablet

## 2016-08-21 ENCOUNTER — Other Ambulatory Visit: Payer: Self-pay | Admitting: Family Medicine

## 2016-08-21 DIAGNOSIS — I10 Essential (primary) hypertension: Secondary | ICD-10-CM

## 2016-08-24 NOTE — Telephone Encounter (Signed)
Last amlodipine Rx 09/2015. Pt was due for follow up in 03/2016 and is past due. 90 day supply sent to pharmacy for refill. Please call pt to schedule appt before further refills are needed.

## 2016-08-27 ENCOUNTER — Telehealth: Payer: Self-pay | Admitting: Family Medicine

## 2016-08-27 NOTE — Telephone Encounter (Signed)
09/23/15 [place] PPPS, SUBSEQ VISIT [id number] patient scheduled with Glenard Haring 09/24/16 at 9am and follow up with PCP at 10am

## 2016-08-27 NOTE — Telephone Encounter (Signed)
Patient states she is waiting until she gets low on medication before she schedule. Patient informed that providers schedule fills up fast and she needed to schedule prior to getting low on medications in order to be able to continue getting refills. Patients stated she understood and would look at her schedule and call back to schedule appointment.

## 2016-09-03 DIAGNOSIS — H353221 Exudative age-related macular degeneration, left eye, with active choroidal neovascularization: Secondary | ICD-10-CM | POA: Diagnosis not present

## 2016-09-22 NOTE — Progress Notes (Signed)
Pre visit review using our clinic review tool, if applicable. No additional management support is needed unless otherwise documented below in the visit note. 

## 2016-09-22 NOTE — Progress Notes (Signed)
Subjective:   Helen [place] is a 83 y.o. female who presents for Medicare Annual (Subsequent) preventive examination.  Review of Systems:  No ROS.  Medicare Wellness Visit.  Cardiac Risk Factors include: advanced age (>53men, >48 women);dyslipidemia;hypertension;sedentary lifestyle;obesity (BMI >30kg/m2) Sleep patterns: Pt reports only sleeping about 4 hrs per night. States she has slept like this for years so she is used to it. Naps during the day.   Home Safety/Smoke Alarms:   Feels safe in home. Smoke alarms in place.  Living environment; residence and Firearm Safety: Lives alone. 1 story home. Seat Belt Safety/Bike Helmet: Wears seat belt.   Counseling:   Eye Exam- Wears glasses. Visits eye doctor every 2 months. Planning for cataract sx. Dental- goes every 6 months.  Female:   Pap- n/a due to age    82- n/a due to age-pt declines      Dexa scan- ordered today.        CCS- n/a due to age.     Objective:     Vitals: BP (!) 160/78 (BP Location: Right Arm, Patient Position: Sitting, Cuff Size: Large)   Pulse (!) 56   Ht 5\' 3"  (1.6 m)   Wt 202 lb (91.6 kg)   SpO2 98%   BMI 35.78 kg/m   Body mass index is 35.78 kg/m.   Tobacco History  Smoking Status  . Never Smoker  Smokeless Tobacco  . Never Used     Counseling given: Not Answered   Past Medical History:  Diagnosis Date  . Arthritis   . Heart murmur   . Hemorrhoids   . Hyperlipidemia   . Hypertension   . Macular degeneration   . Osteoporosis    Past Surgical History:  Procedure Laterality Date  . ABDOMINAL HYSTERECTOMY    . BREAST BIOPSY    . KNEE ARTHROSCOPY    . LEEP     Family History  Problem Relation Age of Onset  . Parkinsonism Mother   . Rheum arthritis Mother   . Arthritis    . Heart disease    . Diabetes     History  Sexual Activity  . Sexual activity: Not on file    Outpatient Encounter Prescriptions as of 09/24/2016  Medication Sig  . amLODipine (NORVASC) 2.5 MG tablet TAKE  ONE TABLET BY MOUTH ONCE DAILY  . aspirin 81 MG tablet Take 81 mg by mouth daily.    . meloxicam (MOBIC) 7.5 MG tablet TAKE ONE TABLET BY MOUTH ONCE DAILY AS NEEDED  . simvastatin (ZOCOR) 20 MG tablet TAKE ONE TABLET BY MOUTH AT BEDTIME  . sotalol (BETAPACE) 120 MG tablet 120 mg 2 (two) times daily.  Marland Kitchen triamcinolone cream (KENALOG) 0.1 % Apply 1 application topically 3 (three) times daily.   No facility-administered encounter medications on file as of 09/24/2016.     Activities of Daily Living In your present state of health, do you have any difficulty performing the following activities: 09/24/2016  Hearing? N  Vision? N  Difficulty concentrating or making decisions? N  Walking or climbing stairs? N  Dressing or bathing? N  Doing errands, shopping? N  Preparing Food and eating ? N  Using the Toilet? N  In the past six months, have you accidently leaked urine? N  Do you have problems with loss of bowel control? N  Managing your Medications? N  Managing your Finances? N  Housekeeping or managing your Housekeeping? N  Some recent data might be hidden    Patient  Care Team: Ann Held, DO as PCP - General (Family Medicine) Georgeanna Harrison. Quillian Quince, DO as Consulting Physician (Cardiology)    Assessment:    Physical assessment deferred to PCP.  Exercise Activities and Dietary recommendations Current Exercise Habits: The patient does not participate in regular exercise at present, Exercise limited by: None identified   Diet (meal preparation, eat out, water intake, caffeinated beverages, dairy products, fruits and vegetables): on average, 3 meals per day    Goals    . maintain lifestyle.      Fall Risk Fall Risk  09/24/2016 09/23/2015 09/23/2015 04/02/2014 03/06/2013  Falls in the past year? Yes No No No No  Number falls in past yr: 1 - - - -  Risk for fall due to : Impaired balance/gait - - - -  Follow up Education provided;Falls prevention discussed - - - -   Depression  Screen PHQ 2/9 Scores 09/24/2016 09/23/2015 09/23/2015 04/02/2014  PHQ - 2 Score 0 0 1 0  PHQ- 9 Score - 0 - -     Cognitive Function MMSE - Mini Mental State Exam 09/24/2016  Orientation to time 5  Orientation to Place 5  Registration 3  Attention/ Calculation 5  Recall 3  Language- name 2 objects 2  Language- repeat 1  Language- follow 3 step command 3  Language- read & follow direction 1  Write a sentence 1  Copy design 1  Total score 30        Immunization History  Administered Date(s) Administered  . Pneumococcal Conjugate-13 09/23/2015  . Pneumococcal Polysaccharide-23 07/30/2010   Screening Tests Health Maintenance  Topic Date Due  . TETANUS/TDAP  02/[birth year]/1953  . DEXA SCAN  02/26/1999  . INFLUENZA VACCINE  02/11/2016  . PNA vac Low Risk Adult  Completed      Plan:     Follow up with PCP today as scheduled.  Schedule bone density scan-ordered today.  Continue doing brain stimulating activities (puzzles, reading, adult coloring books, staying active) to keep memory sharp.   Continue to eat heart healthy diet (full of fruits, vegetables, whole grains, lean protein, water--limit salt, fat, and sugar intake) and increase physical activity as tolerated.  During the course of the visit the patient was educated and counseled about the following appropriate screening and preventive services:   Vaccines to include Pneumoccal, Influenza, Hepatitis B, Td, Zostavax, HCV  Cardiovascular Disease  Colorectal cancer screening  Bone density screening  Diabetes screening  Glaucoma screening  Mammography/PAP  Nutrition counseling   Patient Instructions (the written plan) was given to the patient.   Shela Nevin, [place]  09/24/2016

## 2016-09-24 ENCOUNTER — Encounter: Payer: Self-pay | Admitting: Family Medicine

## 2016-09-24 ENCOUNTER — Ambulatory Visit (INDEPENDENT_AMBULATORY_CARE_PROVIDER_SITE_OTHER): Payer: Medicare Other | Admitting: Family Medicine

## 2016-09-24 VITALS — BP 148/60 | HR 56 | Ht 63.0 in | Wt 202.0 lb

## 2016-09-24 DIAGNOSIS — Z0000 Encounter for general adult medical examination without abnormal findings: Secondary | ICD-10-CM

## 2016-09-24 DIAGNOSIS — I10 Essential (primary) hypertension: Secondary | ICD-10-CM

## 2016-09-24 DIAGNOSIS — E785 Hyperlipidemia, unspecified: Secondary | ICD-10-CM | POA: Diagnosis not present

## 2016-09-24 DIAGNOSIS — M1990 Unspecified osteoarthritis, unspecified site: Secondary | ICD-10-CM

## 2016-09-24 DIAGNOSIS — Z780 Asymptomatic menopausal state: Secondary | ICD-10-CM

## 2016-09-24 LAB — LIPID PANEL
Cholesterol: 145 mg/dL (ref 0–200)
HDL: 65.8 mg/dL (ref >39.00)
LDL Cholesterol: 67 mg/dL (ref 0–99)
NonHDL: 79.66
Total CHOL/HDL Ratio: 2
Triglycerides: 63 mg/dL (ref 0.0–149.0)
VLDL: 12.6 mg/dL (ref 0.0–40.0)

## 2016-09-24 LAB — CBC WITH DIFFERENTIAL/PLATELET
Basophils Absolute: 0.1 10*3/uL (ref 0.0–0.1)
Basophils Relative: 1.3 % (ref 0.0–3.0)
Eosinophils Absolute: 0.3 10*3/uL (ref 0.0–0.7)
Eosinophils Relative: 5.8 % — ABNORMAL HIGH (ref 0.0–5.0)
HCT: 42.4 % (ref 36.0–46.0)
Hemoglobin: 14 g/dL (ref 12.0–15.0)
Lymphocytes Relative: 32.9 % (ref 12.0–46.0)
Lymphs Abs: 2 10*3/uL (ref 0.7–4.0)
MCHC: 33 g/dL (ref 30.0–36.0)
MCV: 93.7 fl (ref 78.0–100.0)
Monocytes Absolute: 0.6 10*3/uL (ref 0.1–1.0)
Monocytes Relative: 9.6 % (ref 3.0–12.0)
Neutro Abs: 3 10*3/uL (ref 1.4–7.7)
Neutrophils Relative %: 50.4 % (ref 43.0–77.0)
Platelets: 167 10*3/uL (ref 150.0–400.0)
RBC: 4.53 Mil/uL (ref 3.87–5.11)
RDW: 14.8 % (ref 11.5–15.5)
WBC: 6 10*3/uL (ref 4.0–10.5)

## 2016-09-24 LAB — COMPREHENSIVE METABOLIC PANEL
ALT: 25 U/L (ref 0–35)
AST: 26 U/L (ref 0–37)
Albumin: 3.9 g/dL (ref 3.5–5.2)
Alkaline Phosphatase: 76 U/L (ref 39–117)
BUN: 25 mg/dL — ABNORMAL HIGH (ref 6–23)
CO2: 25 mEq/L (ref 19–32)
Calcium: 9.5 mg/dL (ref 8.4–10.5)
Chloride: 110 mEq/L (ref 96–112)
Creatinine, Ser: 1.2 mg/dL (ref 0.40–1.20)
GFR: 45.65 mL/min — ABNORMAL LOW (ref >60.00)
Glucose, Bld: 111 mg/dL — ABNORMAL HIGH (ref 70–99)
Potassium: 4 mEq/L (ref 3.5–5.1)
Sodium: 143 mEq/L (ref 135–145)
Total Bilirubin: 0.9 mg/dL (ref 0.2–1.2)
Total Protein: 6.7 g/dL (ref 6.0–8.3)

## 2016-09-24 MED ORDER — CRISABOROLE 2 % EX OINT: 1.0000 "application " | TOPICAL_OINTMENT | Freq: Two times a day (BID) | CUTANEOUS | 1 refills | Status: DC

## 2016-09-24 MED ORDER — MELOXICAM 7.5 MG PO TABS
ORAL_TABLET | ORAL | 3 refills | Status: DC
Start: 2016-09-24 — End: 2017-06-27

## 2016-09-24 MED ORDER — AMLODIPINE BESYLATE 2.5 MG PO TABS: 2.5000 mg | ORAL_TABLET | Freq: Every day | ORAL | 3 refills | Status: DC

## 2016-09-24 NOTE — Assessment & Plan Note (Signed)
Well controlled, no changes to meds. Encouraged heart healthy diet such as the DASH diet and exercise as tolerated.  °

## 2016-09-24 NOTE — Patient Instructions (Addendum)
Schedule bone density scan-ordered today.  Continue doing brain stimulating activities (puzzles, reading, adult coloring books, staying active) to keep memory sharp.   Continue to eat heart healthy diet (full of fruits, vegetables, whole grains, lean protein, water--limit salt, fat, and sugar intake) and increase physical activity as tolerated.    Preventive Care 80 Years and Older, Female Preventive care refers to lifestyle choices and visits with your health care provider that can promote health and wellness. What does preventive care include?  A yearly physical exam. This is also called an annual well check.  Dental exams once or twice a year.  Routine eye exams. Ask your health care provider how often you should have your eyes checked.  Personal lifestyle choices, including:  Daily care of your teeth and gums.  Regular physical activity.  Eating a healthy diet.  Avoiding tobacco and drug use.  Limiting alcohol use.  Practicing safe sex.  Taking low-dose aspirin every day.  Taking vitamin and mineral supplements as recommended by your health care provider. What happens during an annual well check? The services and screenings done by your health care provider during your annual well check will depend on your age, overall health, lifestyle risk factors, and family history of disease. Counseling  Your health care provider may ask you questions about your:  Alcohol use.  Tobacco use.  Drug use.  Emotional well-being.  Home and relationship well-being.  Sexual activity.  Eating habits.  History of falls.  Memory and ability to understand (cognition).  Work and work Statistician.  Reproductive health. Screening  You may have the following tests or measurements:  Height, weight, and BMI.  Blood pressure.  Lipid and cholesterol levels. These may be checked every 5 years, or more frequently if you are over 25 years old.  Skin check.  Lung cancer  screening. You may have this screening every year starting at age 82 if you have a 30-pack-year history of smoking and currently smoke or have quit within the past 15 years.  Fecal occult blood test (FOBT) of the stool. You may have this test every year starting at age 62.  Flexible sigmoidoscopy or colonoscopy. You may have a sigmoidoscopy every 5 years or a colonoscopy every 10 years starting at age 42.  Hepatitis C blood test.  Hepatitis B blood test.  Sexually transmitted disease (STD) testing.  Diabetes screening. This is done by checking your blood sugar (glucose) after you have not eaten for a while (fasting). You may have this done every 1-3 years.  Bone density scan. This is done to screen for osteoporosis. You may have this done starting at age 61.  Mammogram. This may be done every 1-2 years. Talk to your health care provider about how often you should have regular mammograms. Talk with your health care provider about your test results, treatment options, and if necessary, the need for more tests. Vaccines  Your health care provider may recommend certain vaccines, such as:  Influenza vaccine. This is recommended every year.  Tetanus, diphtheria, and acellular pertussis (Tdap, Td) vaccine. You may need a Td booster every 10 years.  Varicella vaccine. You may need this if you have not been vaccinated.  Zoster vaccine. You may need this after age 65.  Measles, mumps, and rubella (MMR) vaccine. You may need at least one dose of MMR if you were born in 1957 or later. You may also need a second dose.  Pneumococcal 13-valent conjugate (PCV13) vaccine. One dose is recommended after  age 67.  Pneumococcal polysaccharide (PPSV23) vaccine. One dose is recommended after age 29.  Meningococcal vaccine. You may need this if you have certain conditions.  Hepatitis A vaccine. You may need this if you have certain conditions or if you travel or work in places where you may be exposed to  hepatitis A.  Hepatitis B vaccine. You may need this if you have certain conditions or if you travel or work in places where you may be exposed to hepatitis B.  Haemophilus influenzae type b (Hib) vaccine. You may need this if you have certain conditions. Talk to your health care provider about which screenings and vaccines you need and how often you need them. This information is not intended to replace advice given to you by your health care provider. Make sure you discuss any questions you have with your health care provider. Document Released: 07/26/2015 Document Revised: 03/18/2016 Document Reviewed: 04/30/2015 Elsevier Interactive Patient Education  2017 [employer].

## 2016-09-24 NOTE — Progress Notes (Signed)
Patient ID: Helen Wilson, female   DOB: Feb 19, 1932, 83 y.o.   MRN: [id number]     Subjective:  I acted as a Education administrator for Dr. Carollee Herter.  Guerry Bruin, [place]   Patient ID: Helen Wilson, female    DOB: 04-01-1932, 83 y.o.   MRN: [id number]  HPI  Patient is in today for follow up blood pressure and cholesterol.  Has aches in both her shoulders that just started.  It hurts when she leans on her elbows.  Patient Care Team: Ann Held, DO as PCP - General (Family Medicine) Georgeanna Harrison. Quillian Quince, DO as Consulting Physician (Cardiology)   Past Medical History:  Diagnosis Date  . Arthritis   . Heart murmur   . Hemorrhoids   . Hyperlipidemia   . Hypertension   . Macular degeneration   . Osteoporosis     Past Surgical History:  Procedure Laterality Date  . ABDOMINAL HYSTERECTOMY    . BREAST BIOPSY    . KNEE ARTHROSCOPY    . LEEP      Family History  Problem Relation Age of Onset  . Parkinsonism Mother   . Rheum arthritis Mother   . Arthritis    . Heart disease    . Diabetes      Social History   Social History  . Marital status: Married    Spouse name: N/A  . Number of children: N/A  . Years of education: N/A   Occupational History  . Not on file.   Social History Main Topics  . Smoking status: Never Smoker  . Smokeless tobacco: Never Used  . Alcohol use Yes     Comment: socially  . Drug use: No  . Sexual activity: Not on file   Other Topics Concern  . Not on file   Social History Narrative  . No narrative on file    Outpatient Medications Prior to Visit  Medication Sig Dispense Refill  . aspirin 81 MG tablet Take 81 mg by mouth daily.      . simvastatin (ZOCOR) 20 MG tablet TAKE ONE TABLET BY MOUTH AT BEDTIME 90 tablet 3  . sotalol (BETAPACE) 120 MG tablet 120 mg 2 (two) times daily.  1  . triamcinolone cream (KENALOG) 0.1 % Apply 1 application topically 3 (three) times daily. 80 g 5  . amLODipine (NORVASC) 2.5 MG tablet TAKE ONE TABLET BY MOUTH ONCE DAILY 90  tablet 0  . meloxicam (MOBIC) 7.5 MG tablet TAKE ONE TABLET BY MOUTH ONCE DAILY AS NEEDED 30 tablet 0   No facility-administered medications prior to visit.     No Known Allergies  Review of Systems  Constitutional: Negative for fever and malaise/fatigue.  HENT: Negative for congestion.   Eyes: Negative for blurred vision.  Respiratory: Negative for cough and shortness of breath.   Cardiovascular: Negative for chest pain, palpitations and leg swelling.  Gastrointestinal: Negative for vomiting.  Musculoskeletal: Negative for back pain.  Skin: Negative for rash.  Neurological: Negative for loss of consciousness and headaches.       Objective:    Physical Exam  Constitutional: She is oriented to person, place, and time. She appears well-developed and well-nourished. No distress.  HENT:  Head: Normocephalic and atraumatic.  Eyes: Conjunctivae are normal.  Neck: Normal range of motion. No thyromegaly present.  Cardiovascular: Normal rate and regular rhythm.   Pulmonary/Chest: Effort normal and breath sounds normal. She has no wheezes.  Abdominal: Soft. Bowel sounds are normal. There is no tenderness.  Musculoskeletal:  Normal range of motion. She exhibits no edema or deformity.  Neurological: She is alert and oriented to person, place, and time.  Skin: Skin is warm and dry. She is not diaphoretic.  Psychiatric: She has a normal mood and affect.    BP (!) 148/60   Pulse (!) 56   Ht 5\' 3"  (1.6 m)   Wt 202 lb (91.6 kg)   SpO2 98%   BMI 35.78 kg/m  Wt Readings from Last 3 Encounters:  09/24/16 202 lb (91.6 kg)  09/23/15 204 lb 6.4 oz (92.7 kg)  08/24/15 198 lb (89.8 kg)      Immunization History  Administered Date(s) Administered  . Pneumococcal Conjugate-13 09/23/2015  . Pneumococcal Polysaccharide-23 07/30/2010    Health Maintenance  Topic Date Due  . TETANUS/TDAP  02/25/1953  . DEXA SCAN  02/26/1999  . INFLUENZA VACCINE  09/24/2017 (Originally 02/11/2016)  . PNA  vac Low Risk Adult  Completed    Lab Results  Component Value Date   WBC 8.6 09/23/2015   HGB 14.8 09/23/2015   HCT 44.8 09/23/2015   PLT 195.0 09/23/2015   GLUCOSE 100 (H) 09/23/2015   CHOL 151 09/23/2015   TRIG 80.0 09/23/2015   HDL 72.50 09/23/2015   LDLDIRECT 173.7 03/06/2013   LDLCALC 63 09/23/2015   ALT 20 09/23/2015   AST 23 09/23/2015   NA 145 09/23/2015   K 4.4 09/23/2015   CL 110 09/23/2015   CREATININE 1.17 09/23/2015   BUN 26 (H) 09/23/2015   CO2 26 09/23/2015   TSH 2.07 01/15/2011   HGBA1C 5.6 05/10/2014    Lab Results  Component Value Date   TSH 2.07 01/15/2011   Lab Results  Component Value Date   WBC 8.6 09/23/2015   HGB 14.8 09/23/2015   HCT 44.8 09/23/2015   MCV 92.2 09/23/2015   PLT 195.0 09/23/2015   Lab Results  Component Value Date   NA 145 09/23/2015   K 4.4 09/23/2015   CO2 26 09/23/2015   GLUCOSE 100 (H) 09/23/2015   BUN 26 (H) 09/23/2015   CREATININE 1.17 09/23/2015   BILITOT 0.7 09/23/2015   ALKPHOS 77 09/23/2015   AST 23 09/23/2015   ALT 20 09/23/2015   PROT 7.3 09/23/2015   ALBUMIN 4.2 09/23/2015   CALCIUM 9.5 09/23/2015   ANIONGAP 9 08/24/2015   GFR 47.12 (L) 09/23/2015   Lab Results  Component Value Date   CHOL 151 09/23/2015   Lab Results  Component Value Date   HDL 72.50 09/23/2015   Lab Results  Component Value Date   LDLCALC 63 09/23/2015   Lab Results  Component Value Date   TRIG 80.0 09/23/2015   Lab Results  Component Value Date   CHOLHDL 2 09/23/2015   Lab Results  Component Value Date   HGBA1C 5.6 05/10/2014         Assessment & Plan:   Problem List Items Addressed This Visit      Unprioritized   Essential hypertension    Well controlled, no changes to meds. Encouraged heart healthy diet such as the DASH diet and exercise as tolerated.       Relevant Medications   amLODipine (NORVASC) 2.5 MG tablet   Other Relevant Orders   CBC with Differential/Platelet   Hyperlipidemia     Tolerating statin, encouraged heart healthy diet, avoid trans fats, minimize simple carbs and saturated fats. Increase exercise as tolerated      Relevant Medications   amLODipine (NORVASC) 2.5 MG tablet  Other Relevant Orders   Comprehensive metabolic panel   Lipid panel   CBC with Differential/Platelet    Other Visit Diagnoses    Encounter for Medicare annual wellness exam    -  Primary   Postmenopausal       Relevant Orders   DG Bone Density   Osteoarthritis, unspecified osteoarthritis type, unspecified site       Relevant Medications   meloxicam (MOBIC) 7.5 MG tablet      I have changed Ms. Courser's amLODipine. I am also having her start on Crisaborole. Additionally, I am having her maintain her aspirin, sotalol, triamcinolone cream, simvastatin, and meloxicam.  Meds ordered this encounter  Medications  . amLODipine (NORVASC) 2.5 MG tablet    Sig: Take 1 tablet (2.5 mg total) by mouth daily.    Dispense:  90 tablet    Refill:  3    Patient needs office visit for further refills.  . meloxicam (MOBIC) 7.5 MG tablet    Sig: TAKE ONE TABLET BY MOUTH ONCE DAILY AS NEEDED    Dispense:  90 tablet    Refill:  3  . Crisaborole (EUCRISA) 2 % OINT    Sig: Apply 1 application topically 2 (two) times daily. To affected area(s)    Dispense:  60 g    Refill:  1    CMA served as scribe during this visit. History, Physical and Plan performed by medical provider. Documentation and orders reviewed and attested to.  Ann Held, DO

## 2016-09-24 NOTE — Assessment & Plan Note (Signed)
Tolerating statin, encouraged heart healthy diet, avoid trans fats, minimize simple carbs and saturated fats. Increase exercise as tolerated 

## 2016-09-24 NOTE — Progress Notes (Signed)
Reviewed  Duvan Mousel R Lowne Chase, DO  

## 2016-09-26 ENCOUNTER — Other Ambulatory Visit: Payer: Self-pay | Admitting: Family Medicine

## 2016-09-26 DIAGNOSIS — E785 Hyperlipidemia, unspecified: Secondary | ICD-10-CM

## 2016-09-28 ENCOUNTER — Other Ambulatory Visit (INDEPENDENT_AMBULATORY_CARE_PROVIDER_SITE_OTHER): Payer: Medicare Other

## 2016-09-28 ENCOUNTER — Other Ambulatory Visit: Payer: Self-pay | Admitting: Family Medicine

## 2016-09-28 DIAGNOSIS — R739 Hyperglycemia, unspecified: Secondary | ICD-10-CM

## 2016-09-28 DIAGNOSIS — R7309 Other abnormal glucose: Secondary | ICD-10-CM | POA: Diagnosis not present

## 2016-09-28 DIAGNOSIS — E785 Hyperlipidemia, unspecified: Secondary | ICD-10-CM

## 2016-09-28 LAB — HEMOGLOBIN A1C: Hgb A1c MFr Bld: 5.8 % (ref 4.6–6.5)

## 2016-10-01 ENCOUNTER — Ambulatory Visit (HOSPITAL_BASED_OUTPATIENT_CLINIC_OR_DEPARTMENT_OTHER)
Admission: RE | Admit: 2016-10-01 | Discharge: 2016-10-01 | Disposition: A | Discharge status: Home / Self Care | Payer: Medicare Other | Source: Ambulatory Visit | Attending: Family Medicine | Admitting: Family Medicine

## 2016-10-01 DIAGNOSIS — Z780 Asymptomatic menopausal state: Secondary | ICD-10-CM | POA: Insufficient documentation

## 2016-10-01 DIAGNOSIS — M85852 Other specified disorders of bone density and structure, left thigh: Secondary | ICD-10-CM | POA: Insufficient documentation

## 2016-10-19 DIAGNOSIS — Z6835 Body mass index (BMI) 35.0-35.9, adult: Secondary | ICD-10-CM | POA: Diagnosis not present

## 2016-10-19 DIAGNOSIS — E7800 Pure hypercholesterolemia, unspecified: Secondary | ICD-10-CM | POA: Diagnosis not present

## 2016-10-19 DIAGNOSIS — I10 Essential (primary) hypertension: Secondary | ICD-10-CM | POA: Diagnosis not present

## 2016-10-19 DIAGNOSIS — I480 Paroxysmal atrial fibrillation: Secondary | ICD-10-CM | POA: Diagnosis not present

## 2016-10-22 DIAGNOSIS — H43812 Vitreous degeneration, left eye: Secondary | ICD-10-CM | POA: Diagnosis not present

## 2016-10-22 DIAGNOSIS — H353222 Exudative age-related macular degeneration, left eye, with inactive choroidal neovascularization: Secondary | ICD-10-CM | POA: Diagnosis not present

## 2016-10-22 DIAGNOSIS — H353111 Nonexudative age-related macular degeneration, right eye, early dry stage: Secondary | ICD-10-CM | POA: Diagnosis not present

## 2016-10-27 ENCOUNTER — Other Ambulatory Visit: Payer: Self-pay | Admitting: Family Medicine

## 2016-10-27 DIAGNOSIS — R21 Rash and other nonspecific skin eruption: Secondary | ICD-10-CM

## 2016-11-17 DIAGNOSIS — L820 Inflamed seborrheic keratosis: Secondary | ICD-10-CM | POA: Diagnosis not present

## 2016-11-17 DIAGNOSIS — L570 Actinic keratosis: Secondary | ICD-10-CM | POA: Diagnosis not present

## 2016-11-17 DIAGNOSIS — L578 Other skin changes due to chronic exposure to nonionizing radiation: Secondary | ICD-10-CM | POA: Diagnosis not present

## 2016-11-17 DIAGNOSIS — Z791 Long term (current) use of non-steroidal anti-inflammatories (NSAID): Secondary | ICD-10-CM | POA: Diagnosis not present

## 2016-11-17 DIAGNOSIS — Z85828 Personal history of other malignant neoplasm of skin: Secondary | ICD-10-CM | POA: Diagnosis not present

## 2016-12-10 DIAGNOSIS — H353222 Exudative age-related macular degeneration, left eye, with inactive choroidal neovascularization: Secondary | ICD-10-CM | POA: Diagnosis not present

## 2016-12-16 ENCOUNTER — Other Ambulatory Visit (INDEPENDENT_AMBULATORY_CARE_PROVIDER_SITE_OTHER): Payer: Medicare Other

## 2016-12-16 DIAGNOSIS — E785 Hyperlipidemia, unspecified: Secondary | ICD-10-CM | POA: Diagnosis not present

## 2016-12-16 DIAGNOSIS — R739 Hyperglycemia, unspecified: Secondary | ICD-10-CM | POA: Diagnosis not present

## 2016-12-16 LAB — LIPID PANEL
Cholesterol: 143 mg/dL (ref 0–200)
HDL: 65.7 mg/dL (ref >39.00)
LDL Cholesterol: 62 mg/dL (ref 0–99)
NonHDL: 77.25
Total CHOL/HDL Ratio: 2
Triglycerides: 77 mg/dL (ref 0.0–149.0)
VLDL: 15.4 mg/dL (ref 0.0–40.0)

## 2016-12-16 LAB — COMPREHENSIVE METABOLIC PANEL
ALT: 16 U/L (ref 0–35)
AST: 17 U/L (ref 0–37)
Albumin: 3.9 g/dL (ref 3.5–5.2)
Alkaline Phosphatase: 70 U/L (ref 39–117)
BUN: 23 mg/dL (ref 6–23)
CO2: 27 mEq/L (ref 19–32)
Calcium: 9.4 mg/dL (ref 8.4–10.5)
Chloride: 108 mEq/L (ref 96–112)
Creatinine, Ser: 1.23 mg/dL — ABNORMAL HIGH (ref 0.40–1.20)
GFR: 44.34 mL/min — ABNORMAL LOW (ref >60.00)
Glucose, Bld: 108 mg/dL — ABNORMAL HIGH (ref 70–99)
Potassium: 4.4 mEq/L (ref 3.5–5.1)
Sodium: 141 mEq/L (ref 135–145)
Total Bilirubin: 0.9 mg/dL (ref 0.2–1.2)
Total Protein: 6.7 g/dL (ref 6.0–8.3)

## 2016-12-16 LAB — CBC
HCT: 41.9 % (ref 36.0–46.0)
Hemoglobin: 14 g/dL (ref 12.0–15.0)
MCHC: 33.4 g/dL (ref 30.0–36.0)
MCV: 94.4 fl (ref 78.0–100.0)
Platelets: 170 10*3/uL (ref 150.0–400.0)
RBC: 4.44 Mil/uL (ref 3.87–5.11)
RDW: 14.2 % (ref 11.5–15.5)
WBC: 6.5 10*3/uL (ref 4.0–10.5)

## 2016-12-21 ENCOUNTER — Other Ambulatory Visit: Payer: Self-pay | Admitting: Family Medicine

## 2016-12-21 DIAGNOSIS — R739 Hyperglycemia, unspecified: Secondary | ICD-10-CM

## 2016-12-21 DIAGNOSIS — E785 Hyperlipidemia, unspecified: Secondary | ICD-10-CM

## 2017-01-05 DIAGNOSIS — H353220 Exudative age-related macular degeneration, left eye, stage unspecified: Secondary | ICD-10-CM | POA: Diagnosis not present

## 2017-01-05 DIAGNOSIS — H25013 Cortical age-related cataract, bilateral: Secondary | ICD-10-CM | POA: Diagnosis not present

## 2017-01-05 DIAGNOSIS — H2513 Age-related nuclear cataract, bilateral: Secondary | ICD-10-CM | POA: Diagnosis not present

## 2017-01-05 DIAGNOSIS — H18413 Arcus senilis, bilateral: Secondary | ICD-10-CM | POA: Diagnosis not present

## 2017-01-05 DIAGNOSIS — H25043 Posterior subcapsular polar age-related cataract, bilateral: Secondary | ICD-10-CM | POA: Diagnosis not present

## 2017-01-05 DIAGNOSIS — H2512 Age-related nuclear cataract, left eye: Secondary | ICD-10-CM | POA: Diagnosis not present

## 2017-01-05 DIAGNOSIS — H353110 Nonexudative age-related macular degeneration, right eye, stage unspecified: Secondary | ICD-10-CM | POA: Diagnosis not present

## 2017-01-28 DIAGNOSIS — H353222 Exudative age-related macular degeneration, left eye, with inactive choroidal neovascularization: Secondary | ICD-10-CM | POA: Diagnosis not present

## 2017-03-01 DIAGNOSIS — H2512 Age-related nuclear cataract, left eye: Secondary | ICD-10-CM | POA: Diagnosis not present

## 2017-03-02 DIAGNOSIS — H2511 Age-related nuclear cataract, right eye: Secondary | ICD-10-CM | POA: Diagnosis not present

## 2017-03-08 DIAGNOSIS — H2512 Age-related nuclear cataract, left eye: Secondary | ICD-10-CM | POA: Diagnosis not present

## 2017-03-19 DIAGNOSIS — H2511 Age-related nuclear cataract, right eye: Secondary | ICD-10-CM | POA: Diagnosis not present

## 2017-03-19 DIAGNOSIS — H25811 Combined forms of age-related cataract, right eye: Secondary | ICD-10-CM | POA: Diagnosis not present

## 2017-03-24 ENCOUNTER — Other Ambulatory Visit (INDEPENDENT_AMBULATORY_CARE_PROVIDER_SITE_OTHER): Payer: Medicare Other

## 2017-03-24 DIAGNOSIS — R739 Hyperglycemia, unspecified: Secondary | ICD-10-CM

## 2017-03-24 DIAGNOSIS — E785 Hyperlipidemia, unspecified: Secondary | ICD-10-CM

## 2017-03-24 LAB — COMPREHENSIVE METABOLIC PANEL
ALT: 14 U/L (ref 0–35)
AST: 18 U/L (ref 0–37)
Albumin: 3.9 g/dL (ref 3.5–5.2)
Alkaline Phosphatase: 74 U/L (ref 39–117)
BUN: 23 mg/dL (ref 6–23)
CO2: 26 mEq/L (ref 19–32)
Calcium: 9.4 mg/dL (ref 8.4–10.5)
Chloride: 109 mEq/L (ref 96–112)
Creatinine, Ser: 1.22 mg/dL — ABNORMAL HIGH (ref 0.40–1.20)
GFR: 44.73 mL/min — ABNORMAL LOW (ref >60.00)
Glucose, Bld: 116 mg/dL — ABNORMAL HIGH (ref 70–99)
Potassium: 4.5 mEq/L (ref 3.5–5.1)
Sodium: 142 mEq/L (ref 135–145)
Total Bilirubin: 0.8 mg/dL (ref 0.2–1.2)
Total Protein: 6.8 g/dL (ref 6.0–8.3)

## 2017-03-24 LAB — LIPID PANEL
Cholesterol: 152 mg/dL (ref 0–200)
HDL: 92.2 mg/dL (ref >39.00)
LDL Cholesterol: 46 mg/dL (ref 0–99)
NonHDL: 59.35
Total CHOL/HDL Ratio: 2
Triglycerides: 66 mg/dL (ref 0.0–149.0)
VLDL: 13.2 mg/dL (ref 0.0–40.0)

## 2017-03-24 LAB — HEMOGLOBIN A1C: Hgb A1c MFr Bld: 5.7 % (ref 4.6–6.5)

## 2017-03-25 DIAGNOSIS — H353222 Exudative age-related macular degeneration, left eye, with inactive choroidal neovascularization: Secondary | ICD-10-CM | POA: Diagnosis not present

## 2017-03-26 ENCOUNTER — Other Ambulatory Visit: Payer: Self-pay | Admitting: Family Medicine

## 2017-03-26 DIAGNOSIS — E785 Hyperlipidemia, unspecified: Secondary | ICD-10-CM

## 2017-03-26 DIAGNOSIS — R7309 Other abnormal glucose: Secondary | ICD-10-CM

## 2017-04-07 DIAGNOSIS — M25512 Pain in left shoulder: Secondary | ICD-10-CM | POA: Diagnosis not present

## 2017-04-07 DIAGNOSIS — S4992XA Unspecified injury of left shoulder and upper arm, initial encounter: Secondary | ICD-10-CM | POA: Diagnosis not present

## 2017-05-20 DIAGNOSIS — H353221 Exudative age-related macular degeneration, left eye, with active choroidal neovascularization: Secondary | ICD-10-CM | POA: Diagnosis not present

## 2017-06-26 ENCOUNTER — Other Ambulatory Visit: Payer: Self-pay

## 2017-06-26 ENCOUNTER — Encounter (HOSPITAL_BASED_OUTPATIENT_CLINIC_OR_DEPARTMENT_OTHER): Payer: Self-pay | Admitting: *Deleted

## 2017-06-26 ENCOUNTER — Inpatient Hospital Stay (HOSPITAL_COMMUNITY): Payer: Medicare Other

## 2017-06-26 ENCOUNTER — Inpatient Hospital Stay (HOSPITAL_BASED_OUTPATIENT_CLINIC_OR_DEPARTMENT_OTHER)
Admission: EM | Admit: 2017-06-26 | Discharge: 2017-06-27 | DRG: 065 | Disposition: A | Discharge status: Home / Self Care | Payer: Medicare Other | Attending: Internal Medicine | Admitting: Internal Medicine

## 2017-06-26 ENCOUNTER — Other Ambulatory Visit (HOSPITAL_COMMUNITY): Payer: Medicare Other

## 2017-06-26 ENCOUNTER — Emergency Department (HOSPITAL_BASED_OUTPATIENT_CLINIC_OR_DEPARTMENT_OTHER): Payer: Medicare Other

## 2017-06-26 DIAGNOSIS — R202 Paresthesia of skin: Secondary | ICD-10-CM | POA: Diagnosis not present

## 2017-06-26 DIAGNOSIS — N183 Chronic kidney disease, stage 3 unspecified: Secondary | ICD-10-CM | POA: Diagnosis present

## 2017-06-26 DIAGNOSIS — M8580 Other specified disorders of bone density and structure, unspecified site: Secondary | ICD-10-CM | POA: Diagnosis present

## 2017-06-26 DIAGNOSIS — M1990 Unspecified osteoarthritis, unspecified site: Secondary | ICD-10-CM | POA: Diagnosis present

## 2017-06-26 DIAGNOSIS — G8194 Hemiplegia, unspecified affecting left nondominant side: Secondary | ICD-10-CM | POA: Diagnosis present

## 2017-06-26 DIAGNOSIS — I129 Hypertensive chronic kidney disease with stage 1 through stage 4 chronic kidney disease, or unspecified chronic kidney disease: Secondary | ICD-10-CM | POA: Diagnosis present

## 2017-06-26 DIAGNOSIS — I480 Paroxysmal atrial fibrillation: Secondary | ICD-10-CM | POA: Diagnosis not present

## 2017-06-26 DIAGNOSIS — H3530 Unspecified macular degeneration: Secondary | ICD-10-CM | POA: Diagnosis present

## 2017-06-26 DIAGNOSIS — E669 Obesity, unspecified: Secondary | ICD-10-CM | POA: Diagnosis not present

## 2017-06-26 DIAGNOSIS — I482 Chronic atrial fibrillation: Secondary | ICD-10-CM

## 2017-06-26 DIAGNOSIS — Z8673 Personal history of transient ischemic attack (TIA), and cerebral infarction without residual deficits: Secondary | ICD-10-CM | POA: Diagnosis not present

## 2017-06-26 DIAGNOSIS — I6381 Other cerebral infarction due to occlusion or stenosis of small artery: Secondary | ICD-10-CM | POA: Diagnosis not present

## 2017-06-26 DIAGNOSIS — I639 Cerebral infarction, unspecified: Secondary | ICD-10-CM | POA: Diagnosis not present

## 2017-06-26 DIAGNOSIS — R29700 NIHSS score 0: Secondary | ICD-10-CM | POA: Diagnosis present

## 2017-06-26 DIAGNOSIS — Z6835 Body mass index (BMI) 35.0-35.9, adult: Secondary | ICD-10-CM

## 2017-06-26 DIAGNOSIS — M810 Age-related osteoporosis without current pathological fracture: Secondary | ICD-10-CM | POA: Diagnosis present

## 2017-06-26 DIAGNOSIS — E785 Hyperlipidemia, unspecified: Secondary | ICD-10-CM | POA: Diagnosis not present

## 2017-06-26 DIAGNOSIS — I10 Essential (primary) hypertension: Secondary | ICD-10-CM | POA: Diagnosis not present

## 2017-06-26 DIAGNOSIS — Z7982 Long term (current) use of aspirin: Secondary | ICD-10-CM | POA: Diagnosis not present

## 2017-06-26 DIAGNOSIS — I4891 Unspecified atrial fibrillation: Secondary | ICD-10-CM | POA: Diagnosis not present

## 2017-06-26 DIAGNOSIS — R29810 Facial weakness: Secondary | ICD-10-CM | POA: Diagnosis not present

## 2017-06-26 DIAGNOSIS — R739 Hyperglycemia, unspecified: Secondary | ICD-10-CM | POA: Diagnosis present

## 2017-06-26 DIAGNOSIS — I2510 Atherosclerotic heart disease of native coronary artery without angina pectoris: Secondary | ICD-10-CM | POA: Diagnosis present

## 2017-06-26 HISTORY — DX: Atherosclerotic heart disease of native coronary artery without angina pectoris: I25.10

## 2017-06-26 HISTORY — DX: Unspecified atrial fibrillation: I48.91

## 2017-06-26 LAB — COMPREHENSIVE METABOLIC PANEL
ALT: 27 U/L (ref 14–54)
AST: 29 U/L (ref 15–41)
Albumin: 3.9 g/dL (ref 3.5–5.0)
Alkaline Phosphatase: 86 U/L (ref 38–126)
Anion gap: 6 (ref 5–15)
BUN: 31 mg/dL — ABNORMAL HIGH (ref 6–20)
CO2: 23 mmol/L (ref 22–32)
Calcium: 9.1 mg/dL (ref 8.9–10.3)
Chloride: 112 mmol/L — ABNORMAL HIGH (ref 101–111)
Creatinine, Ser: 1.32 mg/dL — ABNORMAL HIGH (ref 0.44–1.00)
GFR calc Af Amer: 42 mL/min — ABNORMAL LOW (ref >60)
GFR calc non Af Amer: 36 mL/min — ABNORMAL LOW (ref >60)
Glucose, Bld: 112 mg/dL — ABNORMAL HIGH (ref 65–99)
Potassium: 4 mmol/L (ref 3.5–5.1)
Sodium: 141 mmol/L (ref 135–145)
Total Bilirubin: 0.9 mg/dL (ref 0.3–1.2)
Total Protein: 7.2 g/dL (ref 6.5–8.1)

## 2017-06-26 LAB — APTT: aPTT: 30 seconds (ref 24–36)

## 2017-06-26 LAB — PROTIME-INR
INR: 1.02
Prothrombin Time: 13.3 seconds (ref 11.4–15.2)

## 2017-06-26 LAB — CBC WITH DIFFERENTIAL/PLATELET
Basophils Absolute: 0.1 10*3/uL (ref 0.0–0.1)
Basophils Relative: 1 %
Eosinophils Absolute: 0.5 10*3/uL (ref 0.0–0.7)
Eosinophils Relative: 6 %
HCT: 41.1 % (ref 36.0–46.0)
Hemoglobin: 14 g/dL (ref 12.0–15.0)
Lymphocytes Relative: 41 %
Lymphs Abs: 3.1 10*3/uL (ref 0.7–4.0)
MCH: 31 pg (ref 26.0–34.0)
MCHC: 34.1 g/dL (ref 30.0–36.0)
MCV: 91.1 fL (ref 78.0–100.0)
Monocytes Absolute: 0.8 10*3/uL (ref 0.1–1.0)
Monocytes Relative: 11 %
Neutro Abs: 3.2 10*3/uL (ref 1.7–7.7)
Neutrophils Relative %: 41 %
Platelets: 181 10*3/uL (ref 150–400)
RBC: 4.51 MIL/uL (ref 3.87–5.11)
RDW: 13.9 % (ref 11.5–15.5)
WBC: 7.7 10*3/uL (ref 4.0–10.5)

## 2017-06-26 LAB — TROPONIN I: Troponin I: <0.03 ng/mL (ref <0.03)

## 2017-06-26 MED ORDER — ASPIRIN 81 MG PO CHEW
81.0000 mg | CHEWABLE_TABLET | Freq: Every day | ORAL | Status: DC
  Administered 2017-06-26: 81 mg via ORAL
  Filled 2017-06-26 (×2): qty 1

## 2017-06-26 MED ORDER — SOTALOL HCL 120 MG PO TABS
120.0000 mg | ORAL_TABLET | Freq: Two times a day (BID) | ORAL | Status: DC
  Administered 2017-06-26 – 2017-06-27 (×2): 120 mg via ORAL
  Filled 2017-06-26 (×3): qty 1

## 2017-06-26 MED ORDER — AMLODIPINE BESYLATE 2.5 MG PO TABS
2.5000 mg | ORAL_TABLET | Freq: Every day | ORAL | Status: DC
  Administered 2017-06-27: 2.5 mg via ORAL
  Filled 2017-06-26: qty 1

## 2017-06-26 MED ORDER — STROKE: EARLY STAGES OF RECOVERY BOOK
Freq: Once | Status: AC
  Administered 2017-06-26: 15:00:00
  Filled 2017-06-26: qty 1

## 2017-06-26 MED ORDER — CRISABOROLE 2 % EX OINT: 1.0000 "application " | TOPICAL_OINTMENT | Freq: Two times a day (BID) | CUTANEOUS | Status: DC

## 2017-06-26 MED ORDER — MELOXICAM 7.5 MG PO TABS: 7.5000 mg | ORAL_TABLET | Freq: Every day | ORAL | Status: DC | PRN

## 2017-06-26 MED ORDER — SIMVASTATIN 20 MG PO TABS
20.0000 mg | ORAL_TABLET | Freq: Every day | ORAL | Status: DC
  Administered 2017-06-26: 20 mg via ORAL
  Filled 2017-06-26: qty 1

## 2017-06-26 MED ORDER — HYDRALAZINE HCL 20 MG/ML IJ SOLN: 5.0000 mg | Freq: Three times a day (TID) | INTRAMUSCULAR | Status: DC | PRN

## 2017-06-26 MED ORDER — SENNOSIDES-DOCUSATE SODIUM 8.6-50 MG PO TABS: 1.0000 | ORAL_TABLET | Freq: Every evening | ORAL | Status: DC | PRN

## 2017-06-26 MED ORDER — ENOXAPARIN SODIUM 40 MG/0.4ML ~~LOC~~ SOLN
40.0000 mg | SUBCUTANEOUS | Status: DC
  Administered 2017-06-26 – 2017-06-27 (×2): 40 mg via SUBCUTANEOUS
  Filled 2017-06-26 (×2): qty 0.4

## 2017-06-26 NOTE — ED Notes (Signed)
Attempted to call report to 3W -- nurse unavailable.

## 2017-06-26 NOTE — ED Provider Notes (Signed)
[facility] Provider Note: Georgena Spurling, MD, FACEP  CSN: [id number] MRN: [id number] ARRIVAL: 06/26/17 at [place]  06/26/17 2:15 AM Helen Wilson is a 83 y.o. female who has had the gradual onset of left-sided paresthesias since about 5 PM yesterday evening.  She first noticed it in her left hand.  Throughout the night it has spread to the entire left side of her body.  She is not aware of any focal weakness but nursing staff reports some weakness of the left leg.  She is having no difficulty speaking or swallowing.  She drove herself to the ED and is able to ambulate.  She denies headache, nausea, vomiting or chest pain.   Past Medical History:  Diagnosis Date  . Arthritis   . Coronary artery disease   . Heart murmur   . Hemorrhoids   . Hyperlipidemia   . Hypertension   . Macular degeneration   . Osteoporosis     Past Surgical History:  Procedure Laterality Date  . ABDOMINAL HYSTERECTOMY    . BREAST BIOPSY    . KNEE ARTHROSCOPY    . LEEP      Family History  Problem Relation Age of Onset  . Parkinsonism Mother   . Rheum arthritis Mother   . Arthritis Unknown   . Heart disease Unknown   . Diabetes Unknown     Social History   Tobacco Use  . Smoking status: Never Smoker  . Smokeless tobacco: Never Used  Substance Use Topics  . Alcohol use: Yes    Comment: socially  . Drug use: No    Prior to Admission medications   Medication Sig Start Date End Date Taking? Authorizing Provider  amLODipine (NORVASC) 2.5 MG tablet Take 1 tablet (2.5 mg total) by mouth daily. 09/24/16   Ann Held, DO  aspirin 81 MG tablet Take 81 mg by mouth daily.      [provider]  Crisaborole (EUCRISA) 2 % OINT Apply 1 application topically 2 (two) times daily. To affected area(s) 09/24/16   Carollee Herter, Alferd Apa, DO  meloxicam (MOBIC) 7.5 MG tablet TAKE ONE TABLET BY MOUTH ONCE DAILY  AS NEEDED 09/24/16   Carollee Herter, Alferd Apa, DO  simvastatin (ZOCOR) 20 MG tablet TAKE ONE TABLET BY MOUTH ONCE DAILY AT BEDTIME 09/28/16   Carollee Herter, Alferd Apa, DO  sotalol (BETAPACE) 120 MG tablet 120 mg 2 (two) times daily. 12/07/14   [provider]  triamcinolone cream (KENALOG) 0.1 % APPLY  CREAM TOPICALLY THREE TIMES DAILY 10/27/16   Ann Held, DO    Allergies Patient has no known allergies.   REVIEW OF SYSTEMS  Negative except as noted here or in the History of Present Illness.   PHYSICAL EXAMINATION  Initial Vital Signs Temperature 98.1 F (36.7 C), height 5\' 3"  (1.6 m), weight 90.7 kg (200 lb).  Examination General: Well-developed, well-nourished female in no acute distress; appearance consistent with age of record HENT: normocephalic; atraumatic Eyes: pupils equal, round and reactive to light; extraocular muscles intact Neck: supple; no bruit Heart: regular rate and rhythm Lungs: clear to auscultation bilaterally Abdomen: soft; nondistended; nontender; bowel sounds present Extremities: No deformity; full range of motion; pulses normal Neurologic: Awake, alert and oriented; motor strength plus 5 out of 5 in upper extremities and right lower extremity, plus 4 out of 5 in left lower extremity; intact but altered  sensation on left side of body including face, trunk, left upper extremity and left lower extremity; no facial droop; tongue midline Skin: Warm and dry Psychiatric: Normal mood and affect   RESULTS  Summary of this visit's results, reviewed by myself:   EKG Interpretation  Date/Time:  Saturday June 26 2017 02:07:08 EST Ventricular Rate:  64 PR Interval:    QRS Duration: 88 QT Interval:  432 QTC Calculation: 446 R Axis:   -13 Text Interpretation:  Sinus rhythm Left ventricular hypertrophy Borderline T abnormalities, anterior leads T-wave abnormalities more pronounced Confirmed by Shanon Rosser [phone]) on 06/26/2017 2:17:06 AM       Laboratory Studies: Results for orders placed or performed during the hospital encounter of 06/26/17 (from the past 24 hour(s))  CBC with Differential     Status: None   Collection Time: 06/26/17  2:05 AM  Result Value Ref Range   WBC 7.7 4.0 - 10.5 K/uL   RBC 4.51 3.87 - 5.11 MIL/uL   Hemoglobin 14.0 12.0 - 15.0 g/dL   HCT 41.1 36.0 - 46.0 %   MCV 91.1 78.0 - 100.0 fL   MCH 31.0 26.0 - 34.0 pg   MCHC 34.1 30.0 - 36.0 g/dL   RDW 13.9 11.5 - 15.5 %   Platelets 181 150 - 400 K/uL   Neutrophils Relative % 41 %   Neutro Abs 3.2 1.7 - 7.7 K/uL   Lymphocytes Relative 41 %   Lymphs Abs 3.1 0.7 - 4.0 K/uL   Monocytes Relative 11 %   Monocytes Absolute 0.8 0.1 - 1.0 K/uL   Eosinophils Relative 6 %   Eosinophils Absolute 0.5 0.0 - 0.7 K/uL   Basophils Relative 1 %   Basophils Absolute 0.1 0.0 - 0.1 K/uL  Comprehensive metabolic panel     Status: Abnormal   Collection Time: 06/26/17  2:05 AM  Result Value Ref Range   Sodium 141 135 - 145 mmol/L   Potassium 4.0 3.5 - 5.1 mmol/L   Chloride 112 (H) 101 - 111 mmol/L   CO2 23 22 - 32 mmol/L   Glucose, Bld 112 (H) 65 - 99 mg/dL   BUN 31 (H) 6 - 20 mg/dL   Creatinine, Ser 1.32 (H) 0.44 - 1.00 mg/dL   Calcium 9.1 8.9 - 10.3 mg/dL   Total Protein 7.2 6.5 - 8.1 g/dL   Albumin 3.9 3.5 - 5.0 g/dL   AST 29 15 - 41 U/L   ALT 27 14 - 54 U/L   Alkaline Phosphatase 86 38 - 126 U/L   Total Bilirubin 0.9 0.3 - 1.2 mg/dL   GFR calc non Af Amer 36 (L) >60 mL/min   GFR calc Af Amer 42 (L) >60 mL/min   Anion gap 6 5 - 15  Troponin I     Status: None   Collection Time: 06/26/17  2:06 AM  Result Value Ref Range   Troponin I <0.03 <0.03 ng/mL   Imaging Studies: Ct Head Wo Contrast  Result Date: 06/26/2017 CLINICAL DATA:  Numbness or tingling and paresthesias. Left arm tingling. Left facial droop. EXAM: CT HEAD WITHOUT CONTRAST TECHNIQUE: Contiguous axial images were obtained from the base of the skull through the vertex without intravenous  contrast. COMPARISON:  None. FINDINGS: Brain: Possible punctate lacunar infarcts in the anterior limb of the right basal ganglia, age indeterminate. Age-related cerebral atrophy. No intracranial hemorrhage, mass effect, or midline shift. No hydrocephalus. The basilar cisterns are patent. No extra-axial or intracranial fluid collection. Vascular: Atherosclerosis of  skullbase vasculature without hyperdense vessel or abnormal calcification. Skull: No fracture or focal lesion. Sinuses/Orbits: Scattered opacification of ethmoid air cells. No sinus fluid level. Mastoid air cells are clear. Other: None. IMPRESSION: Possible subcentimeter age-indeterminate lacunar infarcts in the right basal ganglia. No hemorrhage or large vessel ischemia. Electronically Signed   By: Jeb Levering M.D.   On: 06/26/2017 03:33    ED COURSE  Nursing notes and initial vitals signs, including pulse oximetry, reviewed.  Vitals:   06/26/17 0207 06/26/17 0214 06/26/17 0230  BP:   128/78  Pulse:   63  Resp:   14  Temp:  98.1 F (36.7 C)   SpO2:   97%  Weight: 90.7 kg (200 lb)    Height: 5\' 3"  (1.6 m)      PROCEDURES    ED DIAGNOSES     ICD-10-CM   1. Stroke, recent, without late effect Z86.73       Isbella Arline, Jenny Reichmann, MD 06/26/17 [phone]

## 2017-06-26 NOTE — Progress Notes (Signed)
PT Cancellation Note  Patient Details Name: Helen Wilson MRN: [id number] DOB: 1931/12/17   Cancelled Treatment:    Reason Eval/Treat Not Completed: Patient at procedure or test/unavailable. Pt off of the floor to vascular lab. PT will continue to f/u with pt as available.    [place] 06/26/2017, 11:21 AM

## 2017-06-26 NOTE — ED Notes (Signed)
Pt now states she had a "blood rush" 2-3 days ago and she has not ha one of these in a long time

## 2017-06-26 NOTE — ED Notes (Signed)
Grips equal bilateral  Some weakness to  left leg

## 2017-06-26 NOTE — Consult Note (Signed)
Neurology Consultation Reason for Consult: Stroke Referring Physician: Joyice Faster  CC: Left-sided numbness  History is obtained from: Patient  HPI: Helen Wilson is a 83 y.o. female who is less her normal state of health several days ago.  She only had numbness in her left hand for 2 days, and then subsequently yesterday she began having worsening numbness of her left side.  Due to this she sought care in the emergency department where an MRI was performed showing a small ischemic infarct on the right.  She denies weakness, difficulty swallowing, other symptoms.   LKW: 12/13 tpa given?: no, out of window    ROS: Unable to obtain due to altered mental status.   Past Medical History:  Diagnosis Date  . Arthritis   . Atrial fibrillation ([place])   . Coronary artery disease   . Heart murmur   . Hemorrhoids   . Hyperlipidemia   . Hypertension   . Macular degeneration   . Osteoporosis      Family History  Problem Relation Age of Onset  . Parkinsonism Mother   . Rheum arthritis Mother   . Stroke Father   . Arthritis Unknown   . Heart disease Unknown   . Diabetes Unknown      Social History:  reports that  has never smoked. she has never used smokeless tobacco. She reports that she drinks alcohol. She reports that she does not use drugs.   Exam: Current vital signs: BP (!) 138/56   Pulse (!) 58   Temp 98.1 F (36.7 C) (Oral)   Resp 16   Ht 5\' 3"  (1.6 m)   Wt 90.7 kg (200 lb)   SpO2 96%   BMI 35.43 kg/m  Vital signs in last 24 hours: Temp:  [98.1 F (36.7 C)-98.4 F (36.9 C)] 98.1 F (36.7 C) (12/15 1300) Pulse Rate:  [54-63] 58 (12/15 0800) Resp:  [12-16] 16 (12/15 0800) BP: (117-165)/(55-94) 138/56 (12/15 1700) SpO2:  [93 %-98 %] 96 % (12/15 1700) Weight:  [90.7 kg (200 lb)] 90.7 kg (200 lb) (12/15 0900)   Physical Exam  Constitutional: Appears well-developed and well-nourished.  Psych: Affect appropriate to situation Eyes: No scleral injection HENT: No OP  obstrucion Head: Normocephalic.  Cardiovascular: Normal rate and regular rhythm.  Respiratory: Effort normal, non-labored breathing GI: Soft.  No distension. There is no tenderness.  Skin: WDI  Neuro: Mental Status: Patient is awake, alert, oriented to person, place, month, year, and situation. Patient is able to give a clear and coherent history. No signs of aphasia or neglect Cranial Nerves: II: Visual Fields are full. Pupils are equal, round, and reactive to light.   III,IV, VI: EOMI without ptosis or diploplia.  V: Facial sensation is decreased on the left VII: Facial movement is symmetric.  VIII: hearing is intact to voice X: Uvula elevates symmetrically XI: Shoulder shrug is symmetric. XII: tongue is midline without atrophy or fasciculations.  Motor: Tone is normal. Bulk is normal. 5/5 strength was present in all four extremities.  Sensory: Sensation is decreased on the left Cerebellar: No clear ataxia  I have reviewed labs in epic and the results pertinent to this consultation are: CMP- mildly elevated creatinine  I have reviewed the images obtained: MRI brain-small thalamic infarct  Impression: 82 year old female with small thalamic infarct.  I suspect small vessel disease.  She will need further workup for secondary stroke risk factor modification and therapy evaluations.  Recommendations: 1. HgbA1c, fasting lipid panel 2. Frequent neuro checks  3. Echocardiogram 4. Carotid dopplers 5. Prophylactic therapy-Antiplatelet med: Aspirin - dose 325mg  PO or 300mg  PR 6. Risk factor modification 7. Telemetry monitoring 8. PT consult, OT consult, Speech consult 9. please page stroke NP  Or  PA  Or MD  from 8am -4 pm as this patient will be followed by the stroke team at this point.   You can look them up on [email or web address]     Roland Rack, MD [facility] [phone]  If 7pm- 7am, please page neurology on call as listed in [place].

## 2017-06-26 NOTE — Progress Notes (Signed)
Carotid duplex prelim: 1-39% ICA stenosis.  Wilbert Hayashi Eunice, RDMS, RVT   

## 2017-06-26 NOTE — ED Triage Notes (Addendum)
C/o left facial numbness, arm numbness feels like going to left leg onset last pm around 1700   Funny feeling to left side of face. Left arm and left leg  Pt drove self to ed

## 2017-06-26 NOTE — Plan of Care (Signed)
L sided numbness, onset yesterday, now whole L side body is numb.  Tiny bit of weakness of L leg.  This persisted into today, so she finally came in to ED to get It checked out.  CT head shows age indeterm lacunar infarct in R basal ganglia.  Patient ceritanly out of time window for TPA (and has pretty low stroke score anyhow).  Sending patient to inpatient tele.  Consult neurology for eval when patient arrives.

## 2017-06-26 NOTE — Progress Notes (Signed)
SLP Cancellation Note  Patient Details Name: Helen Wilson MRN: [id number] DOB: 10/11/1931   Cancelled treatment:       Reason Eval/Treat Not Completed: SLP screened, no needs identified, will sign off. Swallow orders received. Per charting, pt passed RN stroke swallow screen, is tolerating regular diet per RN. Will f/u for cognitive-linguistic evaluation.   Deneise Lever, [place], [place] Speech-Language Pathologist [phone]   Aliene Altes 06/26/2017, 1:27 PM

## 2017-06-26 NOTE — H&P (Signed)
History and Physical    Helen Wilson TIR:[id number] DOB: 04-May-1932 DOA: 06/26/2017   PCP: Ann Held, DO   Patient coming from:  Home    Chief Complaint: Left sided weakness   HPI: Helen Wilson is a 83 y.o. female with medical history significant for hypertension, hyperlipidemia, osteoarthritis-osteopenia, CKD stage III, atrial fibrillation, CAD, presenting with left-sided numbness since yesterday, initially on the left arm, eventually extending to the left lower extremity this morning, accompanied by numbness. She reports tingling in the left leg.  She denies any difficulty speaking or swallowing.  She denies any vision changes, headaches, nausea or vomiting.  She denies any vertigo, dizziness, syncope or presyncope.  She denies any chest pain or palpitations. She denies any shortness of breath or recent illnesses.  She denies any recent long distance trips.   She denies any lower extremity swelling or calf pain.  No urinary or bowel incontinence or retention.  No history of seizures; she denies any recent hospitalizations, or surgeries.  She is compliant with her medications and takes aspirin a day she denies any prior events as such described.  Has never been seen by neurology.  No confusion is reported.  Positive family history of stroke in her father.  Never had a miscarriage.  She denies any tobacco, alcohol or recreational drug use.  No herbal products, or hormonal medicines.  Denies any history of diabetes.   ED Course:  BP (!) 165/71   Pulse (!) 58   Temp 98.4 F (36.9 C) (Oral)   Resp 16   Ht 5\' 3"  (1.6 m)   Wt 90.7 kg (200 lb)   SpO2 95%   BMI 35.43 kg/m   CT of the head showed age indeterminate lacunar infarct in the right basal ganglia.   She is out of window for TPA  Sinus rhythm Left ventricular hypertrophy Borderline T abnormalities, tone, and followed by cardiology as outpatient cbc is normal. Creatinine 1.32 Glucose 112 Troponin is negative  Review of  Systems:  As per HPI otherwise all other systems reviewed and are negative  Past Medical History:  Diagnosis Date  . Arthritis   . Atrial fibrillation ([place])   . Coronary artery disease   . Heart murmur   . Hemorrhoids   . Hyperlipidemia   . Hypertension   . Macular degeneration   . Osteoporosis     Past Surgical History:  Procedure Laterality Date  . ABDOMINAL HYSTERECTOMY    . BREAST BIOPSY    . KNEE ARTHROSCOPY    . LEEP      Social History Social History   Socioeconomic History  . Marital status: Married    Spouse name: Not on file  . Number of children: Not on file  . Years of education: Not on file  . Highest education level: Not on file  Social Needs  . Financial resource strain: Not on file  . Food insecurity - worry: Not on file  . Food insecurity - inability: Not on file  . Transportation needs - medical: Not on file  . Transportation needs - non-medical: Not on file  Occupational History  . Not on file  Tobacco Use  . Smoking status: Never Smoker  . Smokeless tobacco: Never Used  Substance and Sexual Activity  . Alcohol use: Yes    Comment: socially  . Drug use: No  . Sexual activity: No  Other Topics Concern  . Not on file  Social History Narrative  . Not on  file     No Known Allergies  Family History  Problem Relation Age of Onset  . Parkinsonism Mother   . Rheum arthritis Mother   . Stroke Father   . Arthritis Unknown   . Heart disease Unknown   . Diabetes Unknown       Prior to Admission medications   Medication Sig Start Date End Date Taking? Authorizing Provider  amLODipine (NORVASC) 2.5 MG tablet Take 1 tablet (2.5 mg total) by mouth daily. 09/24/16   Ann Held, DO  aspirin 81 MG tablet Take 81 mg by mouth daily.      [provider]  Crisaborole (EUCRISA) 2 % OINT Apply 1 application topically 2 (two) times daily. To affected area(s) 09/24/16   Carollee Herter, Alferd Apa, DO  meloxicam (MOBIC) 7.5 MG tablet TAKE  ONE TABLET BY MOUTH ONCE DAILY AS NEEDED 09/24/16   Carollee Herter, Alferd Apa, DO  simvastatin (ZOCOR) 20 MG tablet TAKE ONE TABLET BY MOUTH ONCE DAILY AT BEDTIME 09/28/16   Carollee Herter, Alferd Apa, DO  sotalol (BETAPACE) 120 MG tablet 120 mg 2 (two) times daily. 12/07/14   [provider]  triamcinolone cream (KENALOG) 0.1 % APPLY  CREAM TOPICALLY THREE TIMES DAILY 10/27/16   Ann Held, DO    Physical Exam:  Vitals:   06/26/17 0700 06/26/17 0730 06/26/17 0800 06/26/17 0900  BP: 122/61 132/65 128/60 (!) 165/71  Pulse: (!) 54 61 (!) 58   Resp: 15 12 16    Temp:    98.4 F (36.9 C)  TempSrc:    Oral  SpO2: 93% 96% 95% 95%  Weight:    90.7 kg (200 lb)  Height:    5\' 3"  (1.6 m)   Constitutional: NAD, calm, comfortable .  Looks younger than stated age Eyes: PERRL, lids and conjunctivae normal ENMT: Mucous membranes are moist, without exudate or lesions  Neck: normal, supple, no masses, no thyromegaly Respiratory: clear to auscultation bilaterally, no wheezing, no crackles. Normal respiratory effort  Cardiovascular: irregularly regular  rate and rhythm, 1-2 out of 6 murmur, rubs or gallops. No extremity edema. 2+ pedal pulses. No carotid bruits.  Abdomen: Soft, non tender, No hepatosplenomegaly. Bowel sounds positive.  Musculoskeletal: no clubbing / cyanosis. Moves all extremities Skin: no jaundice, No lesions.  Neurologic: Sensation intact  Strength equal in the right 5 out of 5, on the left, the patient is about 4 out of 5.  The debility is more pronounced on the leg than on the arm.   Psychiatric:   Alert and oriented x 3. Normal mood.     Labs on Admission: I have personally reviewed following labs and imaging studies  CBC: Recent Labs  Lab 06/26/17 0205  WBC 7.7  NEUTROABS 3.2  HGB 14.0  HCT 41.1  MCV 91.1  PLT 536    Basic Metabolic Panel: Recent Labs  Lab 06/26/17 0205  NA 141  K 4.0  CL 112*  CO2 23  GLUCOSE 112*  BUN 31*  CREATININE 1.32*    CALCIUM 9.1    GFR: Estimated Creatinine Clearance: 34.5 mL/min (A) (by C-G formula based on SCr of 1.32 mg/dL (H)).  Liver Function Tests: Recent Labs  Lab 06/26/17 0205  AST 29  ALT 27  ALKPHOS 86  BILITOT 0.9  PROT 7.2  ALBUMIN 3.9   No results for input(s): LIPASE, AMYLASE in the last 168 hours. No results for input(s): AMMONIA in the last 168 hours.  Coagulation Profile:  No results for input(s): INR, PROTIME in the last 168 hours.  Cardiac Enzymes: Recent Labs  Lab 06/26/17 0206  TROPONINI <0.03    BNP (last 3 results) No results for input(s): PROBNP in the last 8760 hours.  HbA1C: No results for input(s): HGBA1C in the last 72 hours.  CBG: No results for input(s): GLUCAP in the last 168 hours.  Lipid Profile: No results for input(s): CHOL, HDL, LDLCALC, TRIG, CHOLHDL, LDLDIRECT in the last 72 hours.  Thyroid Function Tests: No results for input(s): TSH, T4TOTAL, FREET4, T3FREE, THYROIDAB in the last 72 hours.  Anemia Panel: No results for input(s): VITAMINB12, FOLATE, FERRITIN, TIBC, IRON, RETICCTPCT in the last 72 hours.  Urine analysis:    Component Value Date/Time   BILIRUBINUR Negative 09/23/2015 1710   PROTEINUR Neg 09/23/2015 1710   UROBILINOGEN 0.2 09/23/2015 1710   NITRITE Neg 09/23/2015 1710   LEUKOCYTESUR Negative 09/23/2015 1710    Sepsis Labs: @LABRCNTIP (procalcitonin:4,lacticidven:4) )No results found for this or any previous visit (from the past 240 hour(s)).   Radiological Exams on Admission: Ct Head Wo Contrast  Result Date: 06/26/2017 CLINICAL DATA:  Numbness or tingling and paresthesias. Left arm tingling. Left facial droop. EXAM: CT HEAD WITHOUT CONTRAST TECHNIQUE: Contiguous axial images were obtained from the base of the skull through the vertex without intravenous contrast. COMPARISON:  None. FINDINGS: Brain: Possible punctate lacunar infarcts in the anterior limb of the right basal ganglia, age indeterminate. Age-related  cerebral atrophy. No intracranial hemorrhage, mass effect, or midline shift. No hydrocephalus. The basilar cisterns are patent. No extra-axial or intracranial fluid collection. Vascular: Atherosclerosis of skullbase vasculature without hyperdense vessel or abnormal calcification. Skull: No fracture or focal lesion. Sinuses/Orbits: Scattered opacification of ethmoid air cells. No sinus fluid level. Mastoid air cells are clear. Other: None. IMPRESSION: Possible subcentimeter age-indeterminate lacunar infarcts in the right basal ganglia. No hemorrhage or large vessel ischemia. Electronically Signed   By: Jeb Levering M.D.   On: 06/26/2017 03:33    EKG: Independently reviewed.  Assessment/Plan Active Problems:   Acute lacunar infarction   Hyperlipidemia   Essential hypertension   Osteoarthritis   Obesity (BMI 30-39.9)   Chronic renal insufficiency, stage III (moderate) (HCC)   Atrial fibrillation (HCC)    Acute- left sided weakness, due to Acute lacunar infarction confirmed by CT   She is out of window for TPA .Risk factors for CVA include obesity, hypertension, hyperlipidemia, atrial fibrillation, and family history of stroke,   Admit to [place] / Inpatient Stroke order set  MRI/MRA brain   B carotid ultrasound in view of CKD stage 3  Allow permissive HTN Echo   lipid panel A1C Aspirin  appreciate involvement of neurology, Dr. Leonel Ramsay ,awaiting further recommendations   Hypertension BP   165/71   Pulse   58   Continue home anti-hypertensive medications after MRI brain and MRA are clear  Add Hydralazine Q6 hours as needed for BP 210/110   Hyperlipidemia Continue home statins    Chronic kidney disease stage 3, Cr 1.32, BL 1.2-1.3 . No acute issues    Lab Results  Component Value Date   CREATININE 1.32 (H) 06/26/2017   CREATININE 1.22 (H) 03/24/2017   CREATININE 1.23 (H) 12/16/2016  Repeat CMET in am   Atrial Fibrillation, on ASA    Rate controlled Continue meds this pm    Arthritis Continue Mobic prn    DVT prophylaxis:  Lovenox  Code Status:    Full  Family Communication:  Discussed with patient  Disposition Plan: Expect patient to be discharged to home after condition improves Consults called:    Neurology, Dr. Tobias Alexander  Admission status: Tele IP    Sharene Butters, PA-C [facility]   06/26/2017, 9:48 AM

## 2017-06-27 ENCOUNTER — Inpatient Hospital Stay (HOSPITAL_COMMUNITY): Payer: Medicare Other

## 2017-06-27 DIAGNOSIS — Z8673 Personal history of transient ischemic attack (TIA), and cerebral infarction without residual deficits: Secondary | ICD-10-CM

## 2017-06-27 DIAGNOSIS — I480 Paroxysmal atrial fibrillation: Secondary | ICD-10-CM

## 2017-06-27 DIAGNOSIS — I4891 Unspecified atrial fibrillation: Secondary | ICD-10-CM

## 2017-06-27 LAB — ECHOCARDIOGRAM COMPLETE
Ao-asc: 31 cm
Area-P 1/2: 2.75 cm2
E decel time: 275 msec
E/e' ratio: 11.29
FS: 24 % — AB (ref 28–44)
Height: 63 in
IVS/LV PW RATIO, ED: 1.15
LA ID, A-P, ES: 39 mm
LA diam end sys: 39 mm
LA diam index: 1.9 cm/m2
LA vol A4C: 62.3 ml
LA vol index: 31.8 mL/m2
LA vol: 65.1 mL
LV E/e' medial: 11.29
LV E/e'average: 11.29
LV PW d: 13 mm — AB (ref 0.6–1.1)
LV e' LATERAL: 9.57 cm/s
LVOT area: 4.15 cm2
LVOT diameter: 23 mm
Lateral S' vel: 11.3 cm/s
MV Dec: 275
MV Peak grad: 5 mmHg
MV pk A vel: 120 m/s
MV pk E vel: 108 m/s
P 1/2 time: 80 ms
PV Reg vel dias: 75.6 cm/s
Reg peak vel: 229 cm/s
TAPSE: 19.8 mm
TDI e' lateral: 9.57
TDI e' medial: 7.72
TR max vel: 229 cm/s
Weight: 3200 oz

## 2017-06-27 LAB — URINALYSIS, COMPLETE (UACMP) WITH MICROSCOPIC
Bacteria, UA: NONE SEEN
Bilirubin Urine: NEGATIVE
Glucose, UA: NEGATIVE mg/dL
Hgb urine dipstick: NEGATIVE
Ketones, ur: NEGATIVE mg/dL
Nitrite: NEGATIVE
Protein, ur: NEGATIVE mg/dL
Specific Gravity, Urine: 1.012 (ref 1.005–1.030)
pH: 5 (ref 5.0–8.0)

## 2017-06-27 LAB — RAPID URINE DRUG SCREEN, HOSP PERFORMED
Amphetamines: NOT DETECTED
Barbiturates: NOT DETECTED
Benzodiazepines: NOT DETECTED
Cocaine: NOT DETECTED
Opiates: NOT DETECTED
Tetrahydrocannabinol: NOT DETECTED

## 2017-06-27 LAB — BASIC METABOLIC PANEL
Anion gap: 6 (ref 5–15)
BUN: 23 mg/dL — ABNORMAL HIGH (ref 6–20)
CO2: 25 mmol/L (ref 22–32)
Calcium: 9.2 mg/dL (ref 8.9–10.3)
Chloride: 111 mmol/L (ref 101–111)
Creatinine, Ser: 1.39 mg/dL — ABNORMAL HIGH (ref 0.44–1.00)
GFR calc Af Amer: 39 mL/min — ABNORMAL LOW (ref >60)
GFR calc non Af Amer: 34 mL/min — ABNORMAL LOW (ref >60)
Glucose, Bld: 172 mg/dL — ABNORMAL HIGH (ref 65–99)
Potassium: 3.9 mmol/L (ref 3.5–5.1)
Sodium: 142 mmol/L (ref 135–145)

## 2017-06-27 LAB — LIPID PANEL
Cholesterol: 154 mg/dL (ref 0–200)
HDL: 65 mg/dL (ref >40)
LDL Cholesterol: 73 mg/dL (ref 0–99)
Total CHOL/HDL Ratio: 2.4 RATIO
Triglycerides: 78 mg/dL (ref <150)
VLDL: 16 mg/dL (ref 0–40)

## 2017-06-27 MED ORDER — RIVAROXABAN 15 MG PO TABS: 15.0000 mg | ORAL_TABLET | Freq: Every day | ORAL | 0 refills | Status: DC

## 2017-06-27 MED ORDER — SIMVASTATIN 40 MG PO TABS: 40.0000 mg | ORAL_TABLET | Freq: Every day | ORAL | Status: DC

## 2017-06-27 MED ORDER — ASPIRIN 81 MG PO CHEW: 324.0000 mg | CHEWABLE_TABLET | Freq: Every day | ORAL | Status: DC

## 2017-06-27 NOTE — Discharge Instructions (Signed)
Please stop taking Mobic for now. Have your doctor check you kidney function in 1-2 wks with the following blood work: BMET   .Please take all your medications with you for your next visit with your Primary MD. Please request your Primary MD to go over all hospital test results at the follow up. Please ask your Primary MD to get all Hospital records sent to his/her office.  If you experience worsening of your admission symptoms, develop shortness of breath, chest pain, suicidal or homicidal thoughts or a life threatening emergency, you must seek medical attention immediately by calling 911 or calling your MD.  Dennis Bast must read the complete instructions/literature along with all the possible adverse reactions/side effects for all the medicines you take including new medications that have been prescribed to you. Take new medicines after you have completely understood and accpet all the possible adverse reactions/side effects.   Do not drive when taking pain medications or sedatives.    Do not take more than prescribed Pain, Sleep and Anxiety Medications  If you have smoked or chewed Tobacco in the last 2 yrs please stop. Stop any regular alcohol and or recreational drug use.  Wear Seat belts while driving.

## 2017-06-27 NOTE — Progress Notes (Signed)
STROKE TEAM PROGRESS NOTE   SUBJECTIVE (INTERVAL HISTORY) Dr. Wynelle Cleveland is at the bedside.  Pt stated that she was on Eliquis before for Afib but developed swollen ankle and bruises. She stopped eliquis. She does not want coumadin. After discussion, she would like to try Xarelto for anticoagulation.    OBJECTIVE Temp:  [97.8 F (36.6 C)-98.2 F (36.8 C)] 97.8 F (36.6 C) (12/16 0510) Pulse Rate:  [54-67] 54 (12/16 0510) Cardiac Rhythm: Heart block (12/16 0700) Resp:  [16-18] 18 (12/16 0510) BP: (126-151)/(52-94) 136/69 (12/16 0510) SpO2:  [94 %-96 %] 96 % (12/16 0510)  CBC:  Recent Labs  Lab 06/26/17 0205  WBC 7.7  NEUTROABS 3.2  HGB 14.0  HCT 41.1  MCV 91.1  PLT 062    Basic Metabolic Panel:  Recent Labs  Lab 06/26/17 0205 06/27/17 0827  NA 141 142  K 4.0 3.9  CL 112* 111  CO2 23 25  GLUCOSE 112* 172*  BUN 31* 23*  CREATININE 1.32* 1.39*  CALCIUM 9.1 9.2    Lipid Panel:     Component Value Date/Time   CHOL 154 06/27/2017 0429   TRIG 78 06/27/2017 0429   HDL 65 06/27/2017 0429   CHOLHDL 2.4 06/27/2017 0429   VLDL 16 06/27/2017 0429   LDLCALC 73 06/27/2017 0429   HgbA1c:  Lab Results  Component Value Date   HGBA1C 5.7 03/24/2017   Urine Drug Screen:     Component Value Date/Time   LABOPIA NONE DETECTED 06/27/2017 0000   COCAINSCRNUR NONE DETECTED 06/27/2017 0000   LABBENZ NONE DETECTED 06/27/2017 0000   AMPHETMU NONE DETECTED 06/27/2017 0000   THCU NONE DETECTED 06/27/2017 0000   LABBARB NONE DETECTED 06/27/2017 0000    Alcohol Level No results found for: [place] I have personally reviewed the radiological images below and agree with the radiology interpretations.  Ct Head Wo Contrast 06/26/2017 IMPRESSION:  Possible subcentimeter age-indeterminate lacunar infarcts in the right basal ganglia. No hemorrhage or large vessel ischemia.     Mr Jodene Nam Head Wo Contrast 06/26/2017 IMPRESSION:  1. Small acute lacunar infarct in the central right  thalamus. No associated hemorrhage or mass effect.  2. No other acute intracranial abnormality, and otherwise mild for age nonspecific chronic signal changes in the brain.  3. Intracranial artery tortuosity but otherwise negative intracranial MRA.    Transthoracic Echocardiogram  06/27/2017 - Left ventricle: The cavity size was normal. Wall thickness was   increased in a pattern of moderate LVH. Systolic function was   normal. The estimated ejection fraction was in the range of 60%   to 65%. Wall motion was normal; there were no regional wall   motion abnormalities. Doppler parameters are consistent with   abnormal left ventricular relaxation (grade 1 diastolic   dysfunction). - Aortic valve: There was no stenosis. - Aorta: Mildly dilated aortic root. Aortic root dimension: 38 mm   (ED). - Mitral valve: Mildly calcified annulus. There was trivial   regurgitation. - Left atrium: The atrium was mildly dilated. - Right ventricle: The cavity size was normal. Systolic function   was normal. - Tricuspid valve: Peak RV-RA gradient (S): 21 mm Hg. - Pulmonary arteries: PA peak pressure: 24 mm Hg (S). - Inferior vena cava: The vessel was normal in size. The   respirophasic diameter changes were in the normal range (>= 50%),   consistent with normal central venous pressure. Impressions: - Normal LV size with moderate LV hypertrophy. EF 60-65%. Normal RV   size  and systolic function. No significant valvular   abnormalities.  Bilateral Carotid Dopplers  06/26/2017 1-39% ICA stenosis.   PHYSICAL EXAM Temp:  [97.8 F (36.6 C)-98.2 F (36.8 C)] 97.8 F (36.6 C) (12/16 0510) Pulse Rate:  [54-67] 67 (12/16 1400) Resp:  [16-18] 16 (12/16 1400) BP: (126-151)/(52-75) 149/64 (12/16 1400) SpO2:  [94 %-96 %] 96 % (12/16 1400)  General - Well nourished, well developed, in no apparent distress.  Ophthalmologic - Sharp disc margins OU.   Cardiovascular - Regular rate and rhythm, not in  afib  Mental Status -  Level of arousal and orientation to time, place, and person were intact. Language including expression, naming, repetition, comprehension was assessed and found intact. Attention span and concentration were normal. Fund of Knowledge was assessed and was intact.  Cranial Nerves II - XII - II - Visual field intact OU. III, IV, VI - Extraocular movements intact. V - Facial sensation slightly decreased on the left. VII - Facial movement intact bilaterally. VIII - Hearing & vestibular intact bilaterally. X - Palate elevates symmetrically. XI - Chin turning & shoulder shrug intact bilaterally. XII - Tongue protrusion intact.  Motor Strength - The patient's strength was normal in all extremities and pronator drift was absent.  Bulk was normal and fasciculations were absent.   Motor Tone - Muscle tone was assessed at the neck and appendages and was normal.  Reflexes - The patient's reflexes were 1+ in all extremities and she had no pathological reflexes.  Sensory - Light touch, temperature/pinprick were assessed and were slightly decreased at LUE.    Coordination - The patient had normal movements in the hands with no ataxia or dysmetria.  Tremor was absent.  Gait and Station - deferred   ASSESSMENT/PLAN Helen Wilson is a 83 y.o. female with history of hypertension, hyperlipidemia, coronary artery disease, atrial fibrillation (not anticoagulated), and macular degeneration presenting with left-sided numbness. She did not receive IV t-PA due to late presentation.  Stroke:  Small acute lacunar infarct in the central right thalamus - likely related to PAF not on AC  Resultant  - left facial and UE decreased sensation  CT head - Possible subcentimeter age-indeterminate lacunar infarcts in the right basal ganglia.   MRI head - Small acute lacunar infarct in the central right thalamus.  MRA head - Intracranial artery tortuosity but otherwise negative intracranial  MRA.   Carotid Doppler - 1-39% ICA stenosis.  2D Echo - EF 60-65%. No cardiac source of emboli identified.  LDL - 73  HgbA1c - pending  VTE prophylaxis - Lovenox Diet Heart Room service appropriate? Yes; Fluid consistency: Thin  aspirin 81 mg daily prior to admission, now on aspirin 81 mg daily. Pt willing to be discharged with Xarelto  Patient counseled to be compliant with her antithrombotic medications  Ongoing aggressive stroke risk factor management  Therapy recommendations:  No OT follow-up recommended. PT evaluation pending.  Disposition:  Pending  PAF not on AC  Was on eliquis in the past  Developed side effect from eliquis - swollen ankles, bruises  Not interested in coumadin  Willing to try Xarelto  OK to discharge with Xarelto  Hypertension  Stable  Permissive hypertension (OK if < 220/120) but gradually normalize in 5-7 days  Long-term BP goal normotensive  Hyperlipidemia  Home meds:  Zocor 20 mg daily resumed in hospital  LDL 73, goal < 70  Increase Zocor to 40 mg daily  Continue statin at discharge  Other Stroke Risk Factors  Advanced age  ETOH use, advised to drink no more than 1 drink per day.  Family hx stroke (father)  Coronary artery disease  Other Active Problems  Elevated Cre 1.39  Hyperglycemia - mild  Hospital day # 1  Neurology will sign off. Please call with questions. Pt will follow up with Cecille Rubin, NP, at [facility] in about 6 weeks. Thanks for the consult.   Rosalin Hawking, MD PhD Stroke Neurology 06/27/2017 3:07 PM   To contact Stroke Continuity provider, please refer to [email or web address] After hours, contact General Neurology

## 2017-06-27 NOTE — Discharge Summary (Addendum)
Physician Discharge Summary  Sirinity Outland QMG:[id number] DOB: June 23, 1932 DOA: 06/26/2017  PCP: Ann Held, DO  Admit date: 06/26/2017 Discharge date: 06/27/2017  Admitted From: home   Disposition:  home   Recommendations for Outpatient Follow-up:  1. F/u Bmet as Cr is a little elevated from baseline  Discharge Condition:  stable   CODE STATUS:  Full code   Consultations:  neuro    Discharge Diagnoses:  Active Problems:   Acute lacunar infarction Atrial fibrillation    Hyperlipidemia   Essential hypertension   Osteoarthritis   Obesity (BMI 30-39.9)   Chronic renal insufficiency, stage III (moderate) (HCC)    Subjective: Numbness has nearly resolved. currently is mild in left face and arm.   HPI: Helen Wilson is a 83 y.o. female with medical history significant for hypertension, hyperlipidemia, osteoarthritis-osteopenia, CKD stage III, atrial fibrillation, CAD, presenting with left-sided numbness since yesterday, initially on the left arm, eventually extending to the left lower extremity this morning,      Hospital Course:  Acute lacunar infarct MRI: Small acute lacunar infarct in the central right thalamus MRA; no stenosis LDL 73 - A1c pending- last A1c 5.7 in 03/24/17 - 2 D ECHO: no thrombus- see report below- had LVH - Carotid ultrasound: Right Carotid: There is evidence in the right ICA of a 1-39% stenosis. Left Carotid: There is evidence in the left ICA of a 1-39% stenosis. Vertebrals: Both vertebral arteries were patent with antegrade flow. - cont Statin - on Aspirin at home- Dr Erlinda Hong has recommended a NOAC due to her h/o A-fib- she prefers Xarelto as she thinks Eliquis caused pedal edema the last time she took it  A-fib - cont Sotalol- not on anticoagulation- per last cardiology note this year, she stopped her Eliqus on her own and was maintained on ASA 81 mg daily subsequently - the patient tells me that the new meds are expensive  - currently in NSR  -  start Xarelto now per neuro due to above CVA- d/c Aspiring  CKD3 - mild increase in renal function from baseline which is 1.2- now Cr 1.3- this may be a progression of CKD rather than AKI  - would hold NSAIDs (on Mobic PRN) - does not appear to be dehydrateed  HTN - Norvasc, Sotalol   Discharge Instructions   Allergies as of 06/27/2017   No Known Allergies     Medication List    STOP taking these medications   aspirin 81 MG tablet   meloxicam 7.5 MG tablet Commonly known as:  MOBIC     TAKE these medications   amLODipine 2.5 MG tablet Commonly known as:  NORVASC Take 1 tablet (2.5 mg total) by mouth daily.   Crisaborole 2 % Oint Commonly known as:  EUCRISA Apply 1 application topically 2 (two) times daily. To affected area(s)   Rivaroxaban 15 MG Tabs tablet Commonly known as:  XARELTO Take 1 tablet (15 mg total) by mouth daily with supper.   simvastatin 20 MG tablet Commonly known as:  ZOCOR TAKE ONE TABLET BY MOUTH ONCE DAILY AT BEDTIME   sotalol 120 MG tablet Commonly known as:  BETAPACE 120 mg 2 (two) times daily.   triamcinolone cream 0.1 % Commonly known as:  KENALOG APPLY  CREAM TOPICALLY THREE TIMES DAILY       No Known Allergies   Procedures/Studies: 2 D ECHO Normal LV size with moderate LV hypertrophy. EF 60-65%. Normal RV   size and systolic function. No significant valvular  abnormalities.  Carotid duplex   Ct Head Wo Contrast  Result Date: 06/26/2017 CLINICAL DATA:  Numbness or tingling and paresthesias. Left arm tingling. Left facial droop. EXAM: CT HEAD WITHOUT CONTRAST TECHNIQUE: Contiguous axial images were obtained from the base of the skull through the vertex without intravenous contrast. COMPARISON:  None. FINDINGS: Brain: Possible punctate lacunar infarcts in the anterior limb of the right basal ganglia, age indeterminate. Age-related cerebral atrophy. No intracranial hemorrhage, mass effect, or midline shift. No  hydrocephalus. The basilar cisterns are patent. No extra-axial or intracranial fluid collection. Vascular: Atherosclerosis of skullbase vasculature without hyperdense vessel or abnormal calcification. Skull: No fracture or focal lesion. Sinuses/Orbits: Scattered opacification of ethmoid air cells. No sinus fluid level. Mastoid air cells are clear. Other: None. IMPRESSION: Possible subcentimeter age-indeterminate lacunar infarcts in the right basal ganglia. No hemorrhage or large vessel ischemia. Electronically Signed   By: Jeb Levering M.D.   On: 06/26/2017 03:33   Mr Brain Wo Contrast  Result Date: 06/26/2017 CLINICAL DATA:  83 year old female with left facial droop, left arm tingling. Possible right basal ganglia lacunar infarct on noncontrast head CT earlier today. EXAM: MRI HEAD WITHOUT CONTRAST MRA HEAD WITHOUT CONTRAST TECHNIQUE: Multiplanar, multiecho pulse sequences of the brain and surrounding structures were obtained without intravenous contrast. Angiographic images of the head were obtained using MRA technique without contrast. COMPARISON:  Noncontrast head CT 0246 hours. FINDINGS: MRI HEAD FINDINGS Brain: Small 7 mm oval area of restricted diffusion in the central right thalamus (series 3, image 25). No associated hemorrhage, T2 FLAIR hyperintensity or mass effect. No other convincing restricted diffusion. No midline shift, mass effect, evidence of mass lesion, ventriculomegaly, extra-axial collection or acute intracranial hemorrhage. Cervicomedullary junction and pituitary are within normal limits. Mild T2 heterogeneity elsewhere in the deep gray matter - including the right basal ganglia - nuclei more resembles small perivascular spaces (normal variant). Mild for age scattered cerebral white matter T2 and FLAIR hyperintensity. No cortical encephalomalacia or chronic cerebral blood products. Negative brainstem and cerebellum. Vascular: Major intracranial vascular flow voids are preserved.  Generalized intracranial artery tortuosity. Skull and upper cervical spine: Negative visualized cervical spine. Visualized bone marrow signal is within normal limits. Sinuses/Orbits: Postoperative changes to both globes. Otherwise normal orbits soft tissues. Paranasal sinuses and mastoids are stable and well pneumatized. Other: Visible internal auditory structures appear normal. Scalp and face soft tissues appear negative. MRA HEAD FINDINGS Antegrade flow in the posterior circulation tortuous distal vertebral arteries without stenosis. Right PICA is visible and patent. Patent basilar artery with tortuosity but no stenosis. SCA and PCA origins are normal. The right posterior communicating artery is present, the left might be diminutive or absent. Right PCA P1 segment an bilateral PCA branches are normal. Antegrade flow in both ICA siphons. No siphon stenosis. Normal ophthalmic and posterior communicating artery origins. Patent carotid termini. Normal MCA and ACA origins. Diminutive anterior communicating artery. Visible ACA branches are within normal limits. MCA M1 segments, MCA bifurcations and visible bilateral MCA branches are within normal limits. IMPRESSION: 1. Small acute lacunar infarct in the central right thalamus. No associated hemorrhage or mass effect. 2. No other acute intracranial abnormality, and otherwise mild for age nonspecific chronic signal changes in the brain. 3. Intracranial artery tortuosity but otherwise negative intracranial MRA. Electronically Signed   By: Genevie Ann M.D.   On: 06/26/2017 12:48   Mr Virgel Paling VQ Contrast  Result Date: 06/26/2017 CLINICAL DATA:  83 year old female with left facial droop, left arm tingling.  Possible right basal ganglia lacunar infarct on noncontrast head CT earlier today. EXAM: MRI HEAD WITHOUT CONTRAST MRA HEAD WITHOUT CONTRAST TECHNIQUE: Multiplanar, multiecho pulse sequences of the brain and surrounding structures were obtained without intravenous  contrast. Angiographic images of the head were obtained using MRA technique without contrast. COMPARISON:  Noncontrast head CT 0246 hours. FINDINGS: MRI HEAD FINDINGS Brain: Small 7 mm oval area of restricted diffusion in the central right thalamus (series 3, image 25). No associated hemorrhage, T2 FLAIR hyperintensity or mass effect. No other convincing restricted diffusion. No midline shift, mass effect, evidence of mass lesion, ventriculomegaly, extra-axial collection or acute intracranial hemorrhage. Cervicomedullary junction and pituitary are within normal limits. Mild T2 heterogeneity elsewhere in the deep gray matter - including the right basal ganglia - nuclei more resembles small perivascular spaces (normal variant). Mild for age scattered cerebral white matter T2 and FLAIR hyperintensity. No cortical encephalomalacia or chronic cerebral blood products. Negative brainstem and cerebellum. Vascular: Major intracranial vascular flow voids are preserved. Generalized intracranial artery tortuosity. Skull and upper cervical spine: Negative visualized cervical spine. Visualized bone marrow signal is within normal limits. Sinuses/Orbits: Postoperative changes to both globes. Otherwise normal orbits soft tissues. Paranasal sinuses and mastoids are stable and well pneumatized. Other: Visible internal auditory structures appear normal. Scalp and face soft tissues appear negative. MRA HEAD FINDINGS Antegrade flow in the posterior circulation tortuous distal vertebral arteries without stenosis. Right PICA is visible and patent. Patent basilar artery with tortuosity but no stenosis. SCA and PCA origins are normal. The right posterior communicating artery is present, the left might be diminutive or absent. Right PCA P1 segment an bilateral PCA branches are normal. Antegrade flow in both ICA siphons. No siphon stenosis. Normal ophthalmic and posterior communicating artery origins. Patent carotid termini. Normal MCA and ACA  origins. Diminutive anterior communicating artery. Visible ACA branches are within normal limits. MCA M1 segments, MCA bifurcations and visible bilateral MCA branches are within normal limits. IMPRESSION: 1. Small acute lacunar infarct in the central right thalamus. No associated hemorrhage or mass effect. 2. No other acute intracranial abnormality, and otherwise mild for age nonspecific chronic signal changes in the brain. 3. Intracranial artery tortuosity but otherwise negative intracranial MRA. Electronically Signed   By: Genevie Ann M.D.   On: 06/26/2017 12:48       Discharge Exam: Vitals:   06/27/17 0120 06/27/17 0510  BP: (!) 126/52 136/69  Pulse: 67 (!) 54  Resp: 16 18  Temp: 98.2 F (36.8 C) 97.8 F (36.6 C)  SpO2: 96% 96%   Vitals:   06/26/17 1900 06/26/17 2153 06/27/17 0120 06/27/17 0510  BP: (!) 151/75 (!) 149/75 (!) 126/52 136/69  Pulse: 65 62 67 (!) 54  Resp: 18 18 16 18   Temp:  98.2 F (36.8 C) 98.2 F (36.8 C) 97.8 F (36.6 C)  TempSrc:  Oral Axillary Oral  SpO2: 94% 95% 96% 96%  Weight:      Height:        General: Pt is alert, awake, not in acute distress Cardiovascular: RRR, S1/S2 +, no rubs, no gallops- 2/6 murmur at RUS border Respiratory: CTA bilaterally, no wheezing, no rhonchi Abdominal: Soft, NT, ND, bowel sounds + Extremities: no edema, no cyanosis    The results of significant diagnostics from this hospitalization (including imaging, microbiology, ancillary and laboratory) are listed below for reference.     Microbiology: No results found for this or any previous visit (from the past 240 hour(s)).   Labs: BNP (  last 3 results) No results for input(s): BNP in the last 8760 hours. Basic Metabolic Panel: Recent Labs  Lab 06/26/17 0205 06/27/17 0827  NA 141 142  K 4.0 3.9  CL 112* 111  CO2 23 25  GLUCOSE 112* 172*  BUN 31* 23*  CREATININE 1.32* 1.39*  CALCIUM 9.1 9.2   Liver Function Tests: Recent Labs  Lab 06/26/17 0205  AST 29  ALT  27  ALKPHOS 86  BILITOT 0.9  PROT 7.2  ALBUMIN 3.9   No results for input(s): LIPASE, AMYLASE in the last 168 hours. No results for input(s): AMMONIA in the last 168 hours. CBC: Recent Labs  Lab 06/26/17 0205  WBC 7.7  NEUTROABS 3.2  HGB 14.0  HCT 41.1  MCV 91.1  PLT 181   Cardiac Enzymes: Recent Labs  Lab 06/26/17 0206  TROPONINI <0.03   BNP: Invalid input(s): POCBNP CBG: No results for input(s): GLUCAP in the last 168 hours. D-Dimer No results for input(s): DDIMER in the last 72 hours. Hgb A1c No results for input(s): HGBA1C in the last 72 hours. Lipid Profile Recent Labs    06/27/17 0429  CHOL 154  HDL 65  LDLCALC 73  TRIG 78  CHOLHDL 2.4   Thyroid function studies No results for input(s): TSH, T4TOTAL, T3FREE, THYROIDAB in the last 72 hours.  Invalid input(s): FREET3 Anemia work up No results for input(s): VITAMINB12, FOLATE, FERRITIN, TIBC, IRON, RETICCTPCT in the last 72 hours. Urinalysis    Component Value Date/Time   COLORURINE YELLOW 06/27/2017 0000   APPEARANCEUR CLEAR 06/27/2017 0000   LABSPEC 1.012 06/27/2017 0000   PHURINE 5.0 06/27/2017 0000   GLUCOSEU NEGATIVE 06/27/2017 0000   HGBUR NEGATIVE 06/27/2017 0000   BILIRUBINUR NEGATIVE 06/27/2017 0000   BILIRUBINUR Negative 09/23/2015 1710   KETONESUR NEGATIVE 06/27/2017 0000   PROTEINUR NEGATIVE 06/27/2017 0000   UROBILINOGEN 0.2 09/23/2015 1710   NITRITE NEGATIVE 06/27/2017 0000   LEUKOCYTESUR TRACE (A) 06/27/2017 0000   Sepsis Labs Invalid input(s): PROCALCITONIN,  WBC,  LACTICIDVEN Microbiology No results found for this or any previous visit (from the past 240 hour(s)).   Time coordinating discharge: Over 30 minutes  SIGNED:   Debbe Odea, MD  [facility] 06/27/2017, 1:50 PM Pager   If 7PM-7AM, please contact night-coverage [email or web address] Password [email or web address]

## 2017-06-27 NOTE — Evaluation (Signed)
Occupational Therapy Evaluation Patient Details Name: Helen Wilson MRN: [id number] DOB: 04/07/1932 Today's Date: 06/27/2017    History of Present Illness 83 y.o. admitted for left sided numbness. MRI revealed MRI showed a small ischemic infarct on the right. PMH includes arthritis, Afib, coronary artery disease, osteoporosis, HTN, macular degeneration, hyperlipidemia.    Clinical Impression   Pt admitted for above. Pt independent with ADLs, PTA. Feel pt will benefit from acute OT to increase gross motor coordination prior to d/c.     Follow Up Recommendations  No OT follow up    Equipment Recommendations  Other (comment)(AE )    Recommendations for Other Services       Precautions / Restrictions Restrictions Weight Bearing Restrictions: No      Mobility Bed Mobility Overal bed mobility: Independent                Transfers     Transfers: Sit to/from Stand Sit to Stand: Independent              Balance          No LOB in session. Used cane. Able to pick up box off of floor.                                 ADL either performed or assessed with clinical judgement   ADL Overall ADL's : Needs assistance/impaired                     Lower Body Dressing: Minimal assistance;Sit to/from stand   Toilet Transfer: Supervision/safety;Ambulation(sit to stand from bed; cane)           Functional mobility during ADLs: Supervision/safety;Cane General ADL Comments: Educated on safety such as safe footwear, sitting for LB bathing.      Vision Baseline Vision/History: Macular Degeneration Vision Assessment?: Yes Tracking/Visual Pursuits: Able to track stimulus in all quads without difficulty Visual Fields: Other (comment)(incorrect in left upper quadrant 2x, otherwise did well. )     Perception     Praxis      Pertinent Vitals/Pain Pain Assessment: No/denies pain     Hand Dominance Right   Extremity/Trunk Assessment Upper  Extremity Assessment Upper Extremity Assessment: LUE deficits/detail LUE Sensation: decreased light touch LUE Coordination: decreased gross motor   Lower Extremity Assessment Lower Extremity Assessment: Defer to PT evaluation       Communication Communication Communication: No difficulties   Cognition Arousal/Alertness: Awake/alert Behavior During Therapy: WFL for tasks assessed/performed Overall Cognitive Status: No family/caregiver present to determine baseline cognitive functioning(slow processing-pt reports this is normal)                                     General Comments       Exercises     Shoulder Instructions      Home Living Family/patient expects to be discharged to:: Private residence Living Arrangements: Alone Available Help at Discharge: Family;Friend(s);Available PRN/intermittently(maybe) Type of Home: House Home Access: Stairs to enter [employer] of Steps: 3 Entrance Stairs-Rails: Right Home Layout: One level     Bathroom Shower/Tub: Walk-in shower         Home Equipment: Kasandra Knudsen - single point;Walker - 2 wheels;Bedside commode          Prior Functioning/Environment Level of Independence: Independent with assistive device(s)  Comments: uses cane at times        OT Problem List: Decreased cognition;Decreased coordination;Decreased knowledge of precautions;Decreased range of motion;Impaired vision/perception      OT Treatment/Interventions: Self-care/ADL training;Visual/perceptual remediation/compensation;Cognitive remediation/compensation;Therapeutic activities;Patient/family education;DME and/or AE instruction;Therapeutic exercise    OT Goals(Current goals can be found in the care plan section) Acute Rehab OT Goals Patient Stated Goal: not stated OT Goal Formulation: With patient Time For Goal Achievement: 07/04/17 Potential to Achieve Goals: Good ADL Goals Pt Will Perform Lower Body Dressing: sit  to/from stand;with modified independence Additional ADL Goal #1: Pt will independently perform LUE activities/exercises to increase gross motor coordination.  OT Frequency: Min 2X/week   Barriers to D/C:            Co-evaluation              AM-PAC PT "6 Clicks" Daily Activity     Outcome Measure Help from another person eating meals?: None Help from another person taking care of personal grooming?: A Little Help from another person toileting, which includes using toliet, bedpan, or urinal?: A Little Help from another person bathing (including washing, rinsing, drying)?: A Little Help from another person to put on and taking off regular upper body clothing?: A Little Help from another person to put on and taking off regular lower body clothing?: A Little 6 Click Score: 19   End of Session Equipment Utilized During Treatment: Gait belt  Activity Tolerance: Patient tolerated treatment well Patient left: in bed;with call bell/phone within reach  OT Visit Diagnosis: Ataxia, unspecified (R27.0)                Time: 5009-3818 OT Time Calculation (min): 22 min Charges:  OT General Charges $OT Visit: 1 Visit OT Evaluation $OT Eval Moderate Complexity: 1 Mod G-Codes:     Lesly Pontarelli L Aadhya Bustamante OTR/L 06/27/2017, 9:28 AM

## 2017-06-27 NOTE — Progress Notes (Signed)
  Echocardiogram 2D Echocardiogram has been performed.  Helen Wilson Helen Wilson 06/27/2017, 11:57 AM

## 2017-06-27 NOTE — Progress Notes (Signed)
Patient discharged home with family. Telemetry and IV discontinued. Discharge information given. Patient questions asked and answered. Patient left unit via wheelchair with staff. Will transport home with family. Wendee Copp

## 2017-06-27 NOTE — Evaluation (Signed)
Physical Therapy Evaluation Patient Details Name: Helen Wilson MRN: [id number] DOB: 08-04-1931 Today's Date: 06/27/2017   History of Present Illness  83 y.o. admitted for left sided numbness. MRI revealed MRI showed a small ischemic infarct on the right. PMH includes arthritis, Afib, coronary artery disease, osteoporosis, HTN, macular degeneration, hyperlipidemia.   Clinical Impression  Orders received for PT evaluation. Patient demonstrates modest deficits in functional mobility as indicated below, reports this is baseline. No overt LOB. Anticipate patient will be safe for d/c home. No further acute PT needs.    Follow Up Recommendations No PT follow up    Equipment Recommendations  None recommended by PT    Recommendations for Other Services       Precautions / Restrictions Restrictions Weight Bearing Restrictions: No      Mobility  Bed Mobility Overal bed mobility: Independent                Transfers     Transfers: Sit to/from Stand Sit to Stand: Independent            Ambulation/Gait Ambulation/Gait assistance: Modified independent (Device/Increase time) Ambulation Distance (Feet): 310 Feet Assistive device: Straight cane Gait Pattern/deviations: Decreased stride length;Shuffle;Drifts right/left Gait velocity: decreased   General Gait Details: modest instability noted, no physical assist required  Stairs Stairs: Yes Stairs assistance: Modified independent (Device/Increase time) Stair Management: Forwards;One rail Right Number of Stairs: 5 General stair comments: no difficulty  Wheelchair Mobility    Modified Rankin (Stroke Patients Only) Modified Rankin (Stroke Patients Only) Pre-Morbid Rankin Score: No symptoms Modified Rankin: Slight disability     Balance Overall balance assessment: Needs assistance;History of Falls Sitting-balance support: Feet supported Sitting balance-Leahy Scale: Good       Standing balance-Leahy Scale: Good                High level balance activites: Side stepping;Direction changes;Turns;Sudden stops;Head turns High Level Balance Comments: modest instability no overt LOB utilized straight cane. No physical assist required             Pertinent Vitals/Pain      Home Living Family/patient expects to be discharged to:: Private residence Living Arrangements: Alone Available Help at Discharge: Family;Friend(s);Available PRN/intermittently(maybe) Type of Home: House Home Access: Stairs to enter Entrance Stairs-Rails: Right Entrance Stairs-Number of Steps: 3 Home Layout: One level Home Equipment: Cane - single point;Walker - 2 wheels;Bedside commode      Prior Function Level of Independence: Independent with assistive device(s)         Comments: uses cane at times     Hand Dominance   Dominant Hand: Right    Extremity/Trunk Assessment   Upper Extremity Assessment Upper Extremity Assessment: LUE deficits/detail LUE Sensation: decreased light touch LUE Coordination: decreased gross motor    Lower Extremity Assessment Lower Extremity Assessment: Generalized weakness       Communication   Communication: No difficulties  Cognition Arousal/Alertness: Awake/alert Behavior During Therapy: WFL for tasks assessed/performed Overall Cognitive Status: No family/caregiver present to determine baseline cognitive functioning(slow processing-pt reports this is normal)                                        General Comments      Exercises     Assessment/Plan    PT Assessment Patent does not need any further PT services  PT Problem List Decreased balance  PT Treatment Interventions      PT Goals (Current goals can be found in the Care Plan section)  Acute Rehab PT Goals Patient Stated Goal: not stated PT Goal Formulation: All assessment and education complete, DC therapy    Frequency     Barriers to discharge        Co-evaluation                AM-PAC PT "6 Clicks" Daily Activity  Outcome Measure Difficulty turning over in bed (including adjusting bedclothes, sheets and blankets)?: None Difficulty moving from lying on back to sitting on the side of the bed? : A Little Difficulty sitting down on and standing up from a chair with arms (e.g., wheelchair, bedside commode, etc,.)?: A Little Help needed moving to and from a bed to chair (including a wheelchair)?: None Help needed walking in hospital room?: None Help needed climbing 3-5 steps with a railing? : A Little 6 Click Score: 21    End of Session Equipment Utilized During Treatment: Gait belt Activity Tolerance: Patient tolerated treatment well Patient left: in bed;with call bell/phone within reach Nurse Communication: Mobility status PT Visit Diagnosis: Unsteadiness on feet (R26.81)    Time: 1510-1531 PT Time Calculation (min) (ACUTE ONLY): 21 min   Charges:   PT Evaluation $PT Eval Moderate Complexity: 1 Mod     PT G Codes:   PT G-Codes **NOT FOR INPATIENT CLASS** Functional Assessment Tool Used: Clinical judgement Functional Limitation: Mobility: Walking and moving around Mobility: Walking and Moving Around Current Status (O7078): At least 1 percent but less than 20 percent impaired, limited or restricted Mobility: Walking and Moving Around Goal Status [phone]): At least 1 percent but less than 20 percent impaired, limited or restricted Mobility: Walking and Moving Around Discharge Status [id number]): At least 1 percent but less than 20 percent impaired, limited or restricted    Alben Deeds, PT DPT  Board Certified Neurologic Specialist [place] 06/27/2017, 3:34 PM

## 2017-06-28 ENCOUNTER — Telehealth: Payer: Self-pay

## 2017-06-28 ENCOUNTER — Telehealth: Payer: Self-pay | Admitting: Family Medicine

## 2017-06-28 NOTE — Telephone Encounter (Signed)
Transition Care Management Follow-up Telephone Call  ADMISSION DATE: 06/26/2017  DISCHARGE DATE: 06/27/17   How have you been since you were released from the hospital? Ok except numbness on Left side which she was told to expect for a while  Do you understand why you were in the hospital? Yes   Do you understand the discharge instrcutions? Patient states she did not read discharge instuctions    Items Reviewed:  Medications reviewed: Yes  Allergies reviewed:NKDA   Dietary changes reviewed: Low sodium heart healthy   Referrals reviewed:Appointment scheduled for 07/12/17   Functional Questionnaire:   Activities of Daily Living (ADLs): Can perform all independently  Any patient concerns? Xarelto too expensive   Confirmed importance and date/time of follow-up visits scheduled: Yes   Confirmed with patient if condition begins to worsen call PCP or go to the ER. Yes    Patient was given the office number and encouragred to call back with questions or concerns. Yes

## 2017-06-28 NOTE — Telephone Encounter (Signed)
Refer to neuro--  hosp f/u dx stroke We can give samples until appointment with neuro

## 2017-06-28 NOTE — Consult Note (Signed)
            [facility] CM Primary Care Navigator  06/28/2017  Helen Wilson 01/01/1932 [id number]    Attempt to seepatient at the bedsideto identify possible discharge needs but she was alreadydischargedper staff report.   Patient was discharged homeover the weekend.  Primary care provider's officeis listed asprovidingtransition of care (TOC).  Noted transition of care done today by K. Arta Silence has follow-up appointment with primary care provider scheduled on 07/12/17.   For additional questions please contact:  Edwena Felty A. Jaelyne Deeg, BSN, RN-BC [facility] PRIMARY CARE Navigator Cell: [phone]

## 2017-06-28 NOTE — Telephone Encounter (Signed)
Copied from [place] [id number]. Topic: Quick Communication - Rx Refill/Question >> Jun 28, 2017  9:15 AM Chauncey Mann A wrote: Has the patient contacted their pharmacy? No.   (Agent: If no, request that the patient contact the pharmacy for the refill.)   Preferred Pharmacy (with phone number or street name): NA Pt. Calling regarding recent stroke. Pt. Went to the hospital following stroke and was D/C'd, and pt. Would like to discuss with MD about which blood thinners pt. Needs to take. Pt was told to take Xarelto, however, pt. Stated that it is very expensive. Pt. Stated her BIL takes Plavix and they are more reasonable. Pt wanted to know if Furosemide and Plavix are the same category as Xarelto and if pt can take those instead of the prescribed Xarelto. Please advise.    Agent: Please be advised that RX refills may take up to 3 business days. We ask that you follow-up with your pharmacy.

## 2017-06-28 NOTE — Telephone Encounter (Signed)
No f/u w/ Dr. Erlinda Hong- has hosp f/u scheduled with you on 07/12/2017.

## 2017-06-28 NOTE — Telephone Encounter (Signed)
Does she have f/u with DR xu as outpt She needs f/u with them

## 2017-06-28 NOTE — Telephone Encounter (Signed)
FYI

## 2017-06-29 ENCOUNTER — Telehealth: Payer: Self-pay | Admitting: Neurology

## 2017-06-29 DIAGNOSIS — K648 Other hemorrhoids: Secondary | ICD-10-CM | POA: Diagnosis not present

## 2017-06-29 LAB — HEMOGLOBIN A1C
Hgb A1c MFr Bld: 6 % — ABNORMAL HIGH (ref 4.8–5.6)
Mean Plasma Glucose: 126 mg/dL

## 2017-06-29 NOTE — Telephone Encounter (Signed)
Pt has called re: her seeing Dr Erlinda Hong in the hospital and him prescribing Alveda Reasons) Pt states this is a very costly medication and she is wanting something a lot less expensive.  Pt is currently without any medication and is asking  for a call.

## 2017-06-29 NOTE — Telephone Encounter (Signed)
Did you talk with her about this yesterday?

## 2017-06-29 NOTE — Telephone Encounter (Signed)
Patient notified samples ready for pickup.  Samples at front desk.

## 2017-06-29 NOTE — Telephone Encounter (Signed)
I did talk to her to do  Her Hospital follow up and sent her msg to Dr. Carollee Herter.

## 2017-06-29 NOTE — Telephone Encounter (Signed)
Discussed with patient over the phone.  She stated that she went to pharmacy to fill her Xarelto but she has to pay $200 per month out of her pocket.  Her Medicare did not cover medication cost.  She paid $200 for a month yesterday.  She also called her PCP office and she will get some free sample from her PCP.  I told her that if she cannot afford, another choice will be Coumadin.  She adamantly not interested in Coumadin.  I told her that she can go to her PCP and her cardiologist to get free samples, and she can get first month free card to cover her for 1 month free.  She was discussed with her PCP and cardiologist.  She expressed understanding and appreciation.  Rosalin Hawking, MD PhD Stroke Neurology 06/29/2017 6:21 PM

## 2017-06-29 NOTE — Telephone Encounter (Signed)
Calledpatient states she will come by tomorrow and pick up samples of Xarelto. States she does want Referral to Neurology.

## 2017-07-12 ENCOUNTER — Ambulatory Visit (INDEPENDENT_AMBULATORY_CARE_PROVIDER_SITE_OTHER): Payer: Medicare Other | Admitting: Family Medicine

## 2017-07-12 ENCOUNTER — Encounter: Payer: Self-pay | Admitting: Family Medicine

## 2017-07-12 ENCOUNTER — Telehealth: Payer: Self-pay | Admitting: Family Medicine

## 2017-07-12 VITALS — BP 116/60 | HR 54 | Temp 97.9°F | Resp 16 | Ht 62.0 in | Wt 199.8 lb

## 2017-07-12 DIAGNOSIS — I6381 Other cerebral infarction due to occlusion or stenosis of small artery: Secondary | ICD-10-CM

## 2017-07-12 DIAGNOSIS — M159 Polyosteoarthritis, unspecified: Secondary | ICD-10-CM | POA: Diagnosis not present

## 2017-07-12 DIAGNOSIS — R7989 Other specified abnormal findings of blood chemistry: Secondary | ICD-10-CM | POA: Diagnosis not present

## 2017-07-12 DIAGNOSIS — N183 Chronic kidney disease, stage 3 unspecified: Secondary | ICD-10-CM

## 2017-07-12 DIAGNOSIS — I10 Essential (primary) hypertension: Secondary | ICD-10-CM | POA: Diagnosis not present

## 2017-07-12 MED ORDER — TRAMADOL HCL 50 MG PO TABS: 50.0000 mg | ORAL_TABLET | Freq: Four times a day (QID) | ORAL | 1 refills | Status: DC | PRN

## 2017-07-12 NOTE — Progress Notes (Signed)
Patient ID: Helen Wilson, female    DOB: Jan 09, 1932  Age: 83 y.o. MRN: [id number]    Subjective:  Subjective  HPI [name] presents for f/u lacunar infarct -- she had numbness on L hand that eventually extended to face.  -- it is now almost completely gone.  She still has some numbness in her hand.  She is on xaralto.  Pt needs a neuro closer to home.  She was told to stop her mobic but she is unable to stop it due to her arthritis pain.   Ct, mri, mra done in hospital   Review of Systems  Constitutional: Negative for activity change, appetite change, fatigue and unexpected weight change.  Respiratory: Negative for cough and shortness of breath.   Cardiovascular: Negative for chest pain and palpitations.  Psychiatric/Behavioral: Negative for behavioral problems and dysphoric mood. The patient is not nervous/anxious.     History Past Medical History:  Diagnosis Date  . Arthritis   . Atrial fibrillation ([place])   . Coronary artery disease   . Heart murmur   . Hemorrhoids   . Hyperlipidemia   . Hypertension   . Macular degeneration   . Osteoporosis     She has a past surgical history that includes Abdominal hysterectomy; Breast biopsy; Knee arthroscopy; and LEEP.   Her family history includes Arthritis in her unknown relative; Diabetes in her unknown relative; Heart disease in her unknown relative; Parkinsonism in her mother; Rheum arthritis in her mother; Stroke in her father.She reports that  has never smoked. she has never used smokeless tobacco. She reports that she drinks alcohol. She reports that she does not use drugs.  Current Outpatient Medications on File Prior to Visit  Medication Sig Dispense Refill  . amLODipine (NORVASC) 2.5 MG tablet Take 1 tablet (2.5 mg total) by mouth daily. 90 tablet 3  . Rivaroxaban (XARELTO) 15 MG TABS tablet Take 1 tablet (15 mg total) by mouth daily with supper. 30 tablet 0  . simvastatin (ZOCOR) 20 MG tablet TAKE ONE TABLET BY MOUTH ONCE DAILY  AT BEDTIME 90 tablet 3  . sotalol (BETAPACE) 120 MG tablet 120 mg 2 (two) times daily.  1  . triamcinolone cream (KENALOG) 0.1 % APPLY  CREAM TOPICALLY THREE TIMES DAILY 80 g 5   No current facility-administered medications on file prior to visit.      Objective:  Objective  Physical Exam  Constitutional: She is oriented to person, place, and time. She appears well-developed and well-nourished.  HENT:  Head: Normocephalic and atraumatic.  Eyes: Conjunctivae and EOM are normal.  Neck: Normal range of motion. Neck supple. No JVD present. Carotid bruit is not present. No thyromegaly present.  Cardiovascular: Normal rate, regular rhythm and normal heart sounds.  No murmur heard. Pulmonary/Chest: Effort normal and breath sounds normal. No respiratory distress. She has no wheezes. She has no rales. She exhibits no tenderness.  Musculoskeletal: She exhibits no edema.  Neurological: She is alert and oriented to person, place, and time.  Psychiatric: She has a normal mood and affect. Her behavior is normal. Judgment and thought content normal.  Nursing note and vitals reviewed.  BP 116/60 (BP Location: Right Arm, Cuff Size: Large)   Pulse (!) 54   Temp 97.9 F (36.6 C) (Oral)   Resp 16   Ht 5\' 2"  (1.575 m)   Wt 199 lb 12.8 oz (90.6 kg)   SpO2 96%   BMI 36.54 kg/m  Wt Readings from Last 3 Encounters:  07/12/17  199 lb 12.8 oz (90.6 kg)  06/26/17 200 lb (90.7 kg)  09/24/16 202 lb (91.6 kg)     Lab Results  Component Value Date   WBC 7.7 06/26/2017   HGB 14.0 06/26/2017   HCT 41.1 06/26/2017   PLT 181 06/26/2017   GLUCOSE 172 (H) 06/27/2017   CHOL 154 06/27/2017   TRIG 78 06/27/2017   HDL 65 06/27/2017   LDLDIRECT 173.7 03/06/2013   LDLCALC 73 06/27/2017   ALT 27 06/26/2017   AST 29 06/26/2017   NA 142 06/27/2017   K 3.9 06/27/2017   CL 111 06/27/2017   CREATININE 1.39 (H) 06/27/2017   BUN 23 (H) 06/27/2017   CO2 25 06/27/2017   TSH 2.07 01/15/2011   INR 1.02  06/26/2017   HGBA1C 6.0 (H) 06/27/2017    Ct Head Wo Contrast  Result Date: 06/26/2017 CLINICAL DATA:  Numbness or tingling and paresthesias. Left arm tingling. Left facial droop. EXAM: CT HEAD WITHOUT CONTRAST TECHNIQUE: Contiguous axial images were obtained from the base of the skull through the vertex without intravenous contrast. COMPARISON:  None. FINDINGS: Brain: Possible punctate lacunar infarcts in the anterior limb of the right basal ganglia, age indeterminate. Age-related cerebral atrophy. No intracranial hemorrhage, mass effect, or midline shift. No hydrocephalus. The basilar cisterns are patent. No extra-axial or intracranial fluid collection. Vascular: Atherosclerosis of skullbase vasculature without hyperdense vessel or abnormal calcification. Skull: No fracture or focal lesion. Sinuses/Orbits: Scattered opacification of ethmoid air cells. No sinus fluid level. Mastoid air cells are clear. Other: None. IMPRESSION: Possible subcentimeter age-indeterminate lacunar infarcts in the right basal ganglia. No hemorrhage or large vessel ischemia. Electronically Signed   By: Jeb Levering M.D.   On: 06/26/2017 03:33   Mr Brain Wo Contrast  Result Date: 06/26/2017 CLINICAL DATA:  83 year old female with left facial droop, left arm tingling. Possible right basal ganglia lacunar infarct on noncontrast head CT earlier today. EXAM: MRI HEAD WITHOUT CONTRAST MRA HEAD WITHOUT CONTRAST TECHNIQUE: Multiplanar, multiecho pulse sequences of the brain and surrounding structures were obtained without intravenous contrast. Angiographic images of the head were obtained using MRA technique without contrast. COMPARISON:  Noncontrast head CT 0246 hours. FINDINGS: MRI HEAD FINDINGS Brain: Small 7 mm oval area of restricted diffusion in the central right thalamus (series 3, image 25). No associated hemorrhage, T2 FLAIR hyperintensity or mass effect. No other convincing restricted diffusion. No midline shift, mass  effect, evidence of mass lesion, ventriculomegaly, extra-axial collection or acute intracranial hemorrhage. Cervicomedullary junction and pituitary are within normal limits. Mild T2 heterogeneity elsewhere in the deep gray matter - including the right basal ganglia - nuclei more resembles small perivascular spaces (normal variant). Mild for age scattered cerebral white matter T2 and FLAIR hyperintensity. No cortical encephalomalacia or chronic cerebral blood products. Negative brainstem and cerebellum. Vascular: Major intracranial vascular flow voids are preserved. Generalized intracranial artery tortuosity. Skull and upper cervical spine: Negative visualized cervical spine. Visualized bone marrow signal is within normal limits. Sinuses/Orbits: Postoperative changes to both globes. Otherwise normal orbits soft tissues. Paranasal sinuses and mastoids are stable and well pneumatized. Other: Visible internal auditory structures appear normal. Scalp and face soft tissues appear negative. MRA HEAD FINDINGS Antegrade flow in the posterior circulation tortuous distal vertebral arteries without stenosis. Right PICA is visible and patent. Patent basilar artery with tortuosity but no stenosis. SCA and PCA origins are normal. The right posterior communicating artery is present, the left might be diminutive or absent. Right PCA P1 segment an bilateral  PCA branches are normal. Antegrade flow in both ICA siphons. No siphon stenosis. Normal ophthalmic and posterior communicating artery origins. Patent carotid termini. Normal MCA and ACA origins. Diminutive anterior communicating artery. Visible ACA branches are within normal limits. MCA M1 segments, MCA bifurcations and visible bilateral MCA branches are within normal limits. IMPRESSION: 1. Small acute lacunar infarct in the central right thalamus. No associated hemorrhage or mass effect. 2. No other acute intracranial abnormality, and otherwise mild for age nonspecific chronic  signal changes in the brain. 3. Intracranial artery tortuosity but otherwise negative intracranial MRA. Electronically Signed   By: Genevie Ann M.D.   On: 06/26/2017 12:48   Mr Virgel Paling KG Contrast  Result Date: 06/26/2017 CLINICAL DATA:  83 year old female with left facial droop, left arm tingling. Possible right basal ganglia lacunar infarct on noncontrast head CT earlier today. EXAM: MRI HEAD WITHOUT CONTRAST MRA HEAD WITHOUT CONTRAST TECHNIQUE: Multiplanar, multiecho pulse sequences of the brain and surrounding structures were obtained without intravenous contrast. Angiographic images of the head were obtained using MRA technique without contrast. COMPARISON:  Noncontrast head CT 0246 hours. FINDINGS: MRI HEAD FINDINGS Brain: Small 7 mm oval area of restricted diffusion in the central right thalamus (series 3, image 25). No associated hemorrhage, T2 FLAIR hyperintensity or mass effect. No other convincing restricted diffusion. No midline shift, mass effect, evidence of mass lesion, ventriculomegaly, extra-axial collection or acute intracranial hemorrhage. Cervicomedullary junction and pituitary are within normal limits. Mild T2 heterogeneity elsewhere in the deep gray matter - including the right basal ganglia - nuclei more resembles small perivascular spaces (normal variant). Mild for age scattered cerebral white matter T2 and FLAIR hyperintensity. No cortical encephalomalacia or chronic cerebral blood products. Negative brainstem and cerebellum. Vascular: Major intracranial vascular flow voids are preserved. Generalized intracranial artery tortuosity. Skull and upper cervical spine: Negative visualized cervical spine. Visualized bone marrow signal is within normal limits. Sinuses/Orbits: Postoperative changes to both globes. Otherwise normal orbits soft tissues. Paranasal sinuses and mastoids are stable and well pneumatized. Other: Visible internal auditory structures appear normal. Scalp and face soft  tissues appear negative. MRA HEAD FINDINGS Antegrade flow in the posterior circulation tortuous distal vertebral arteries without stenosis. Right PICA is visible and patent. Patent basilar artery with tortuosity but no stenosis. SCA and PCA origins are normal. The right posterior communicating artery is present, the left might be diminutive or absent. Right PCA P1 segment an bilateral PCA branches are normal. Antegrade flow in both ICA siphons. No siphon stenosis. Normal ophthalmic and posterior communicating artery origins. Patent carotid termini. Normal MCA and ACA origins. Diminutive anterior communicating artery. Visible ACA branches are within normal limits. MCA M1 segments, MCA bifurcations and visible bilateral MCA branches are within normal limits. IMPRESSION: 1. Small acute lacunar infarct in the central right thalamus. No associated hemorrhage or mass effect. 2. No other acute intracranial abnormality, and otherwise mild for age nonspecific chronic signal changes in the brain. 3. Intracranial artery tortuosity but otherwise negative intracranial MRA. Electronically Signed   By: Genevie Ann M.D.   On: 06/26/2017 12:48     Assessment & Plan:  Plan  I have discontinued Caycee Dimichele's Crisaborole. I am also having her start on traMADol. Additionally, I am having her maintain her sotalol, amLODipine, simvastatin, triamcinolone cream, and Rivaroxaban.  Meds ordered this encounter  Medications  . traMADol (ULTRAM) 50 MG tablet    Sig: Take 1 tablet (50 mg total) by mouth every 6 (six) hours as needed.  Dispense:  30 tablet    Refill:  1    Problem List Items Addressed This Visit      Unprioritized   Acute lacunar infarction    Pt requesting       Relevant Orders   Ambulatory referral to Neurology   Chronic renal insufficiency, stage III (moderate) (HCC)    Recheck bmp Pt DOES NOT want to stop mobic.  I did ask her to try some ultram to see how that helps       Elevated serum creatinine -  Primary   Relevant Orders   Basic Metabolic Panel (BMET)   Basic metabolic panel   Essential hypertension    Well controlled, no changes to meds. Encouraged heart healthy diet such as the DASH diet and exercise as tolerated.       Osteoarthritis   Relevant Medications   traMADol (ULTRAM) 50 MG tablet      Follow-up: Return in about 3 months (around 10/10/2017), or if symptoms worsen or fail to improve.  Ann Held, DO    +

## 2017-07-12 NOTE — Assessment & Plan Note (Signed)
Recheck bmp Pt DOES NOT want to stop mobic.  I did ask her to try some ultram to see how that helps

## 2017-07-12 NOTE — Telephone Encounter (Signed)
Copied from [place] [id number]. Topic: Quick Communication - See Telephone Encounter >> Jul 12, 2017  2:18 PM Synthia Innocent wrote: CRM for notification. See Telephone encounter for:  Needs dx for prescription of tramadol 50mg . Acute/chronic issues, please advise 07/12/17.

## 2017-07-12 NOTE — Assessment & Plan Note (Signed)
Pt requesting  

## 2017-07-12 NOTE — Patient Instructions (Signed)
Stroke Prevention °Some medical conditions and behaviors are associated with a higher chance of having a stroke. You can help prevent a stroke by making nutrition, lifestyle, and other changes, including managing any medical conditions you may have. °What nutrition changes can be made? °· Eat healthy foods. You can do this by: °? Choosing foods high in fiber, such as fresh fruits and vegetables and whole grains. °? Eating at least 5 or more servings of fruits and vegetables a day. Try to fill half of your plate at each meal with fruits and vegetables. °? Choosing lean protein foods, such as lean cuts of meat, poultry without skin, fish, tofu, beans, and nuts. °? Eating low-fat dairy products. °? Avoiding foods that are high in salt (sodium). This can help lower blood pressure. °? Avoiding foods that have saturated fat, trans fat, and cholesterol. This can help prevent high cholesterol. °? Avoiding processed and premade foods. °· Follow your health care provider's specific guidelines for losing weight, controlling high blood pressure (hypertension), lowering high cholesterol, and managing diabetes. These may include: °? Reducing your daily calorie intake. °? Limiting your daily sodium intake to 1,500 milligrams (mg). °? Using only healthy fats for cooking, such as olive oil, canola oil, or sunflower oil. °? Counting your daily carbohydrate intake. °What lifestyle changes can be made? °· Maintain a healthy weight. Talk to your health care provider about your ideal weight. °· Get at least 30 minutes of moderate physical activity at least 5 days a week. Moderate activity includes brisk walking, biking, and swimming. °· Do not use any products that contain nicotine or tobacco, such as cigarettes and e-cigarettes. If you need help quitting, ask your health care provider. It may also be helpful to avoid exposure to secondhand smoke. °· Limit alcohol intake to no more than 1 drink a day for nonpregnant women and 2 drinks a  day for men. One drink equals 12 oz of beer, 5 oz of wine, or 1½ oz of hard liquor. °· Stop any illegal drug use. °· Avoid taking birth control pills. Talk to your health care provider about the risks of taking birth control pills if: °? You are over 35 years old. °? You smoke. °? You get migraines. °? You have ever had a blood clot. °What other changes can be made? °· Manage your cholesterol levels. °? Eating a healthy diet is important for preventing high cholesterol. If cholesterol cannot be managed through diet alone, you may also need to take medicines. °? Take any prescribed medicines to control your cholesterol as told by your health care provider. °· Manage your diabetes. °? Eating a healthy diet and exercising regularly are important parts of managing your blood sugar. If your blood sugar cannot be managed through diet and exercise, you may need to take medicines. °? Take any prescribed medicines to control your diabetes as told by your health care provider. °· Control your hypertension. °? To reduce your risk of stroke, try to keep your blood pressure below 130/80. °? Eating a healthy diet and exercising regularly are an important part of controlling your blood pressure. If your blood pressure cannot be managed through diet and exercise, you may need to take medicines. °? Take any prescribed medicines to control hypertension as told by your health care provider. °? Ask your health care provider if you should monitor your blood pressure at home. °? Have your blood pressure checked every year, even if your blood pressure is normal. Blood pressure increases with   age and some medical conditions. °· Get evaluated for sleep disorders (sleep apnea). Talk to your health care provider about getting a sleep evaluation if you snore a lot or have excessive sleepiness. °· Take over-the-counter and prescription medicines only as told by your health care provider. Aspirin or blood thinners (antiplatelets or  anticoagulants) may be recommended to reduce your risk of forming blood clots that can lead to stroke. °· Make sure that any other medical conditions you have, such as atrial fibrillation or atherosclerosis, are managed. °What are the warning signs of a stroke? °The warning signs of a stroke can be easily remembered as BEFAST. °· B is for balance. Signs include: °? Dizziness. °? Loss of balance or coordination. °? Sudden trouble walking. °· E is for eyes. Signs include: °? A sudden change in vision. °? Trouble seeing. °· F is for face. Signs include: °? Sudden weakness or numbness of the face. °? The face or eyelid drooping to one side. °· A is for arms. Signs include: °? Sudden weakness or numbness of the arm, usually on one side of the body. °· S is for speech. Signs include: °? Trouble speaking (aphasia). °? Trouble understanding. °· T is for time. °? These symptoms may represent a serious problem that is an emergency. Do not wait to see if the symptoms will go away. Get medical help right away. Call your local emergency services (911 in the [place] Do not drive yourself to the hospital. °· Other signs of stroke may include: °? A sudden, severe headache with no known cause. °? Nausea or vomiting. °? Seizure. ° °Where to find more information: °For more information, visit: °· American Stroke Association: www.strokeassociation.org °· National Stroke Association: www.stroke.org ° °Summary °· You can prevent a stroke by eating healthy, exercising, not smoking, limiting alcohol intake, and managing any medical conditions you may have. °· Do not use any products that contain nicotine or tobacco, such as cigarettes and e-cigarettes. If you need help quitting, ask your health care provider. It may also be helpful to avoid exposure to secondhand smoke. °· Remember BEFAST for warning signs of stroke. Get help right away if you or a loved one has any of these signs. °This information is not intended to replace advice given  to you by your health care provider. Make sure you discuss any questions you have with your health care provider. °Document Released: 08/06/2004 Document Revised: 08/04/2016 Document Reviewed: 08/04/2016 °Elsevier Interactive Patient Education © 2018 [employer] ° °

## 2017-07-12 NOTE — Assessment & Plan Note (Signed)
Well controlled, no changes to meds. Encouraged heart healthy diet such as the DASH diet and exercise as tolerated.  °

## 2017-07-13 ENCOUNTER — Other Ambulatory Visit: Payer: Self-pay

## 2017-07-13 ENCOUNTER — Encounter (HOSPITAL_BASED_OUTPATIENT_CLINIC_OR_DEPARTMENT_OTHER): Payer: Self-pay | Admitting: Emergency Medicine

## 2017-07-13 ENCOUNTER — Inpatient Hospital Stay (HOSPITAL_BASED_OUTPATIENT_CLINIC_OR_DEPARTMENT_OTHER)
Admission: EM | Admit: 2017-07-13 | Discharge: 2017-07-16 | DRG: 394 | Disposition: A | Discharge status: Home / Self Care | Payer: Medicare Other | Attending: Internal Medicine | Admitting: Internal Medicine

## 2017-07-13 DIAGNOSIS — K5909 Other constipation: Secondary | ICD-10-CM | POA: Diagnosis present

## 2017-07-13 DIAGNOSIS — H3530 Unspecified macular degeneration: Secondary | ICD-10-CM | POA: Diagnosis present

## 2017-07-13 DIAGNOSIS — K921 Melena: Secondary | ICD-10-CM

## 2017-07-13 DIAGNOSIS — Z8673 Personal history of transient ischemic attack (TIA), and cerebral infarction without residual deficits: Secondary | ICD-10-CM

## 2017-07-13 DIAGNOSIS — D62 Acute posthemorrhagic anemia: Secondary | ICD-10-CM | POA: Diagnosis present

## 2017-07-13 DIAGNOSIS — I480 Paroxysmal atrial fibrillation: Secondary | ICD-10-CM | POA: Diagnosis not present

## 2017-07-13 DIAGNOSIS — N183 Chronic kidney disease, stage 3 unspecified: Secondary | ICD-10-CM

## 2017-07-13 DIAGNOSIS — N179 Acute kidney failure, unspecified: Secondary | ICD-10-CM | POA: Diagnosis not present

## 2017-07-13 DIAGNOSIS — Z7901 Long term (current) use of anticoagulants: Secondary | ICD-10-CM

## 2017-07-13 DIAGNOSIS — Z66 Do not resuscitate: Secondary | ICD-10-CM | POA: Diagnosis present

## 2017-07-13 DIAGNOSIS — Z79899 Other long term (current) drug therapy: Secondary | ICD-10-CM

## 2017-07-13 DIAGNOSIS — I2510 Atherosclerotic heart disease of native coronary artery without angina pectoris: Secondary | ICD-10-CM | POA: Diagnosis present

## 2017-07-13 DIAGNOSIS — I129 Hypertensive chronic kidney disease with stage 1 through stage 4 chronic kidney disease, or unspecified chronic kidney disease: Secondary | ICD-10-CM | POA: Diagnosis present

## 2017-07-13 DIAGNOSIS — K922 Gastrointestinal hemorrhage, unspecified: Secondary | ICD-10-CM

## 2017-07-13 DIAGNOSIS — K625 Hemorrhage of anus and rectum: Secondary | ICD-10-CM | POA: Diagnosis not present

## 2017-07-13 DIAGNOSIS — R0989 Other specified symptoms and signs involving the circulatory and respiratory systems: Secondary | ICD-10-CM

## 2017-07-13 DIAGNOSIS — M810 Age-related osteoporosis without current pathological fracture: Secondary | ICD-10-CM | POA: Diagnosis present

## 2017-07-13 DIAGNOSIS — E785 Hyperlipidemia, unspecified: Secondary | ICD-10-CM | POA: Diagnosis present

## 2017-07-13 DIAGNOSIS — K626 Ulcer of anus and rectum: Secondary | ICD-10-CM | POA: Diagnosis not present

## 2017-07-13 DIAGNOSIS — Z90710 Acquired absence of both cervix and uterus: Secondary | ICD-10-CM

## 2017-07-13 DIAGNOSIS — I959 Hypotension, unspecified: Secondary | ICD-10-CM | POA: Diagnosis present

## 2017-07-13 HISTORY — DX: Cerebral infarction, unspecified: I63.9

## 2017-07-13 LAB — COMPREHENSIVE METABOLIC PANEL
ALT: 25 U/L (ref 14–54)
AST: 30 U/L (ref 15–41)
Albumin: 3.9 g/dL (ref 3.5–5.0)
Alkaline Phosphatase: 82 U/L (ref 38–126)
Anion gap: 9 (ref 5–15)
BUN: 29 mg/dL — ABNORMAL HIGH (ref 6–20)
CO2: 22 mmol/L (ref 22–32)
Calcium: 8.9 mg/dL (ref 8.9–10.3)
Chloride: 110 mmol/L (ref 101–111)
Creatinine, Ser: 1.49 mg/dL — ABNORMAL HIGH (ref 0.44–1.00)
GFR calc Af Amer: 36 mL/min — ABNORMAL LOW (ref >60)
GFR calc non Af Amer: 31 mL/min — ABNORMAL LOW (ref >60)
Glucose, Bld: 157 mg/dL — ABNORMAL HIGH (ref 65–99)
Potassium: 4 mmol/L (ref 3.5–5.1)
Sodium: 141 mmol/L (ref 135–145)
Total Bilirubin: 0.7 mg/dL (ref 0.3–1.2)
Total Protein: 7.2 g/dL (ref 6.5–8.1)

## 2017-07-13 LAB — CBC
HCT: 39.4 % (ref 36.0–46.0)
Hemoglobin: 13.6 g/dL (ref 12.0–15.0)
MCH: 31.5 pg (ref 26.0–34.0)
MCHC: 34.5 g/dL (ref 30.0–36.0)
MCV: 91.2 fL (ref 78.0–100.0)
Platelets: 191 10*3/uL (ref 150–400)
RBC: 4.32 MIL/uL (ref 3.87–5.11)
RDW: 13.7 % (ref 11.5–15.5)
WBC: 9.5 10*3/uL (ref 4.0–10.5)

## 2017-07-13 LAB — PROTIME-INR
INR: 2.09
Prothrombin Time: 23.3 seconds — ABNORMAL HIGH (ref 11.4–15.2)

## 2017-07-13 NOTE — ED Notes (Signed)
EMT brought pads to patient in restroom. Bright red Blood covered the patients underwear.

## 2017-07-13 NOTE — ED Triage Notes (Signed)
PT presents with c/o rectal bleeding states "it is just pouring out". PT states she had a stroke around the 15th and was placed xarelto and started having  Rectal bleeding around 4pm, pt had to leave triage to go to bathroom twice.

## 2017-07-13 NOTE — ED Notes (Signed)
ED Provider at bedside. 

## 2017-07-13 NOTE — ED Notes (Addendum)
Patient was given multiple pads and mesh panties. Blood clots and bright red blood has covered both pad and mesh panties multiple times. Patient is staying on the toilet until a room is available. Patient states she isn't dizzy or weak.

## 2017-07-14 ENCOUNTER — Inpatient Hospital Stay (HOSPITAL_COMMUNITY): Payer: Medicare Other

## 2017-07-14 ENCOUNTER — Encounter (HOSPITAL_BASED_OUTPATIENT_CLINIC_OR_DEPARTMENT_OTHER): Payer: Self-pay | Admitting: Emergency Medicine

## 2017-07-14 ENCOUNTER — Encounter (HOSPITAL_COMMUNITY)
Admission: EM | Disposition: A | Discharge status: Home / Self Care | Payer: Self-pay | Source: Home / Self Care | Attending: Critical Care Medicine

## 2017-07-14 DIAGNOSIS — Z8673 Personal history of transient ischemic attack (TIA), and cerebral infarction without residual deficits: Secondary | ICD-10-CM | POA: Diagnosis not present

## 2017-07-14 DIAGNOSIS — K625 Hemorrhage of anus and rectum: Secondary | ICD-10-CM

## 2017-07-14 DIAGNOSIS — I480 Paroxysmal atrial fibrillation: Secondary | ICD-10-CM | POA: Diagnosis present

## 2017-07-14 DIAGNOSIS — I129 Hypertensive chronic kidney disease with stage 1 through stage 4 chronic kidney disease, or unspecified chronic kidney disease: Secondary | ICD-10-CM | POA: Diagnosis present

## 2017-07-14 DIAGNOSIS — E785 Hyperlipidemia, unspecified: Secondary | ICD-10-CM | POA: Diagnosis not present

## 2017-07-14 DIAGNOSIS — K922 Gastrointestinal hemorrhage, unspecified: Secondary | ICD-10-CM | POA: Diagnosis present

## 2017-07-14 DIAGNOSIS — I2510 Atherosclerotic heart disease of native coronary artery without angina pectoris: Secondary | ICD-10-CM | POA: Diagnosis not present

## 2017-07-14 DIAGNOSIS — R0989 Other specified symptoms and signs involving the circulatory and respiratory systems: Secondary | ICD-10-CM

## 2017-07-14 DIAGNOSIS — H3530 Unspecified macular degeneration: Secondary | ICD-10-CM | POA: Diagnosis present

## 2017-07-14 DIAGNOSIS — Z90710 Acquired absence of both cervix and uterus: Secondary | ICD-10-CM | POA: Diagnosis not present

## 2017-07-14 DIAGNOSIS — N183 Chronic kidney disease, stage 3 (moderate): Secondary | ICD-10-CM | POA: Diagnosis not present

## 2017-07-14 DIAGNOSIS — Z66 Do not resuscitate: Secondary | ICD-10-CM | POA: Diagnosis not present

## 2017-07-14 DIAGNOSIS — K626 Ulcer of anus and rectum: Secondary | ICD-10-CM | POA: Diagnosis not present

## 2017-07-14 DIAGNOSIS — K921 Melena: Secondary | ICD-10-CM

## 2017-07-14 DIAGNOSIS — D62 Acute posthemorrhagic anemia: Secondary | ICD-10-CM | POA: Diagnosis not present

## 2017-07-14 DIAGNOSIS — N179 Acute kidney failure, unspecified: Secondary | ICD-10-CM | POA: Diagnosis present

## 2017-07-14 DIAGNOSIS — Z7901 Long term (current) use of anticoagulants: Secondary | ICD-10-CM

## 2017-07-14 DIAGNOSIS — K5909 Other constipation: Secondary | ICD-10-CM | POA: Diagnosis present

## 2017-07-14 DIAGNOSIS — I959 Hypotension, unspecified: Secondary | ICD-10-CM | POA: Diagnosis present

## 2017-07-14 DIAGNOSIS — Z79899 Other long term (current) drug therapy: Secondary | ICD-10-CM | POA: Diagnosis not present

## 2017-07-14 DIAGNOSIS — M810 Age-related osteoporosis without current pathological fracture: Secondary | ICD-10-CM | POA: Diagnosis present

## 2017-07-14 HISTORY — PX: FLEXIBLE SIGMOIDOSCOPY: SHX5431

## 2017-07-14 LAB — OCCULT BLOOD X 1 CARD TO LAB, STOOL: Fecal Occult Bld: POSITIVE — AB

## 2017-07-14 LAB — MRSA PCR SCREENING: MRSA by PCR: NEGATIVE

## 2017-07-14 LAB — PROTIME-INR
INR: 1.59
Prothrombin Time: 18.9 seconds — ABNORMAL HIGH (ref 11.4–15.2)

## 2017-07-14 LAB — HEMOGLOBIN AND HEMATOCRIT, BLOOD
HCT: 33.4 % — ABNORMAL LOW (ref 36.0–46.0)
HCT: 30.7 % — ABNORMAL LOW (ref 36.0–46.0)
HCT: 29.3 % — ABNORMAL LOW (ref 36.0–46.0)
Hemoglobin: 11.4 g/dL — ABNORMAL LOW (ref 12.0–15.0)
Hemoglobin: 10.3 g/dL — ABNORMAL LOW (ref 12.0–15.0)
Hemoglobin: 9.5 g/dL — ABNORMAL LOW (ref 12.0–15.0)

## 2017-07-14 LAB — HEMOGLOBIN A1C
Hgb A1c MFr Bld: 5.7 % — ABNORMAL HIGH (ref 4.8–5.6)
Mean Plasma Glucose: 116.89 mg/dL

## 2017-07-14 LAB — TYPE AND SCREEN
ABO/RH(D): O POS
Antibody Screen: NEGATIVE

## 2017-07-14 LAB — ABO/RH: ABO/RH(D): O POS

## 2017-07-14 SURGERY — SIGMOIDOSCOPY, FLEXIBLE
Anesthesia: Moderate Sedation

## 2017-07-14 MED ORDER — SODIUM CHLORIDE 0.9 % IV BOLUS (SEPSIS)
1000.0000 mL | Freq: Once | INTRAVENOUS | Status: AC
  Administered 2017-07-13: 1000 mL via INTRAVENOUS

## 2017-07-14 MED ORDER — SODIUM CHLORIDE 0.9 % IV SOLN
10.0000 mL/h | Freq: Once | INTRAVENOUS | Status: AC
  Administered 2017-07-14: 10 mL/h via INTRAVENOUS

## 2017-07-14 MED ORDER — EPINEPHRINE PF 1 MG/10ML IJ SOSY
PREFILLED_SYRINGE | INTRAMUSCULAR | Status: AC
  Filled 2017-07-14: qty 20

## 2017-07-14 MED ORDER — SODIUM CHLORIDE 0.9 % IV SOLN: INTRAVENOUS | Status: DC

## 2017-07-14 MED ORDER — SOTALOL HCL 80 MG PO TABS: 120.0000 mg | ORAL_TABLET | Freq: Two times a day (BID) | ORAL | Status: DC

## 2017-07-14 MED ORDER — MIDAZOLAM HCL 5 MG/ML IJ SOLN
INTRAMUSCULAR | Status: AC
  Filled 2017-07-14: qty 2

## 2017-07-14 MED ORDER — EPINEPHRINE PF 1 MG/10ML IJ SOSY
PREFILLED_SYRINGE | INTRAMUSCULAR | Status: DC | PRN
  Administered 2017-07-14: .5 mL

## 2017-07-14 MED ORDER — SODIUM CHLORIDE 0.9 % IV SOLN: 250.0000 mL | INTRAVENOUS | Status: DC | PRN

## 2017-07-14 MED ORDER — FENTANYL CITRATE (PF) 100 MCG/2ML IJ SOLN
INTRAMUSCULAR | Status: AC
  Administered 2017-07-14: 25 ug via INTRAVENOUS
  Filled 2017-07-14: qty 2

## 2017-07-14 MED ORDER — TECHNETIUM TC 99M-LABELED RED BLOOD CELLS IV KIT
25.8000 | PACK | Freq: Once | INTRAVENOUS | Status: AC | PRN
  Administered 2017-07-14: 25.8 via INTRAVENOUS

## 2017-07-14 MED ORDER — SOTALOL HCL 120 MG PO TABS
120.0000 mg | ORAL_TABLET | ORAL | Status: DC
  Administered 2017-07-15 – 2017-07-16 (×2): 120 mg via ORAL
  Filled 2017-07-14: qty 2
  Filled 2017-07-14: qty 1

## 2017-07-14 MED ORDER — SODIUM CHLORIDE 0.9 % IV SOLN: Freq: Once | INTRAVENOUS | Status: DC

## 2017-07-14 MED ORDER — FENTANYL CITRATE (PF) 100 MCG/2ML IJ SOLN
INTRAMUSCULAR | Status: AC
  Filled 2017-07-14: qty 2

## 2017-07-14 MED ORDER — ACETAMINOPHEN 325 MG PO TABS
650.0000 mg | ORAL_TABLET | Freq: Once | ORAL | Status: AC
  Administered 2017-07-14: 650 mg via ORAL
  Filled 2017-07-14: qty 2

## 2017-07-14 MED ORDER — SODIUM CHLORIDE 0.9 % IV BOLUS (SEPSIS)
1000.0000 mL | Freq: Once | INTRAVENOUS | Status: AC
  Administered 2017-07-14: 1000 mL via INTRAVENOUS

## 2017-07-14 MED ORDER — FENTANYL CITRATE (PF) 100 MCG/2ML IJ SOLN
25.0000 ug | Freq: Once | INTRAMUSCULAR | Status: AC
  Administered 2017-07-14: 25 ug via INTRAVENOUS

## 2017-07-14 MED ORDER — PROTHROMBIN COMPLEX CONC HUMAN 500 UNITS IV KIT
4000.0000 [IU] | PACK | Status: AC
  Administered 2017-07-14: 4000 [IU] via INTRAVENOUS
  Filled 2017-07-14: qty 4000

## 2017-07-14 NOTE — ED Notes (Signed)
Paged intensivist via [facility]link at 0001 hr

## 2017-07-14 NOTE — ED Provider Notes (Addendum)
[facility] EMERGENCY DEPARTMENT Provider Note   CSN: [id number] Arrival date & time: 07/13/17  2259     History   Chief Complaint Chief Complaint  Patient presents with  . Blood In Stools    HPI Helen Wilson is a 84 y.o. female.  The history is provided by the patient.  Rectal Bleeding  Quality:  Bright red Amount:  Copious Timing:  Constant Chronicity:  New Context: spontaneously   Context: not anal fissures, not defecation and not rectal injury   Similar prior episodes: no   Relieved by:  Nothing Worsened by:  Nothing Ineffective treatments:  None tried Associated symptoms: no abdominal pain, no dizziness, no epistaxis, no fever, no loss of consciousness and no vomiting   Risk factors: anticoagulant use   Risk factors comment:  Xarelto   Past Medical History:  Diagnosis Date  . Arthritis   . Atrial fibrillation ([place])   . Coronary artery disease   . Heart murmur   . Hemorrhoids   . Hyperlipidemia   . Hypertension   . Macular degeneration   . Osteoporosis   . Stroke [facility])     Patient Active Problem List   Diagnosis Date Noted  . Elevated serum creatinine 07/12/2017  . Acute lacunar infarction 06/26/2017  . Atrial fibrillation ([place]) 12/14/2014  . Chronic renal insufficiency, stage III (moderate) ([place]) 04/02/2014  . UTI (urinary tract infection) 01/30/2014  . Obesity (BMI 30-39.9) 03/06/2013  . Hemorrhoids, internal, with bleeding 04/08/2011  . Hair loss 01/15/2011  . Osteoarthritis 09/04/2010  . NONSPECIFIC ABNORM RESULTS KIDNEY FUNCTION STUDY 09/01/2010  . Hyperlipidemia 07/30/2010  . Essential hypertension 07/30/2010  . ALLERGIC ARTHRITIS OTHER SPECIFIED SITES 07/30/2010  . HIP PAIN, BILATERAL 07/30/2010  . OSTEOPENIA 07/30/2010    Past Surgical History:  Procedure Laterality Date  . ABDOMINAL HYSTERECTOMY    . BREAST BIOPSY    . KNEE ARTHROSCOPY    . LEEP      OB History    No data available       Home Medications    Prior  to Admission medications   Medication Sig Start Date End Date Taking? Authorizing Provider  amLODipine (NORVASC) 2.5 MG tablet Take 1 tablet (2.5 mg total) by mouth daily. Patient taking differently: Take 5 mg by mouth daily.  09/24/16   Ann Held, DO  Rivaroxaban (XARELTO) 15 MG TABS tablet Take 1 tablet (15 mg total) by mouth daily with supper. 06/27/17   Debbe Odea, MD  simvastatin (ZOCOR) 20 MG tablet TAKE ONE TABLET BY MOUTH ONCE DAILY AT BEDTIME 09/28/16   Carollee Herter, Alferd Apa, DO  sotalol (BETAPACE) 120 MG tablet 120 mg 2 (two) times daily. 12/07/14   [provider]  traMADol (ULTRAM) 50 MG tablet Take 1 tablet (50 mg total) by mouth every 6 (six) hours as needed. 07/12/17   Roma Schanz R, DO  triamcinolone cream (KENALOG) 0.1 % APPLY  CREAM TOPICALLY THREE TIMES DAILY 10/27/16   Ann Held, DO    Family History Family History  Problem Relation Age of Onset  . Parkinsonism Mother   . Rheum arthritis Mother   . Stroke Father   . Arthritis Unknown   . Heart disease Unknown   . Diabetes Unknown     Social History Social History   Tobacco Use  . Smoking status: Never Smoker  . Smokeless tobacco: Never Used  Substance Use Topics  . Alcohol use: Yes    Comment: socially  .  Drug use: No     Allergies   Patient has no known allergies.   Review of Systems Review of Systems  Constitutional: Negative for diaphoresis and fever.  HENT: Negative for nosebleeds.   Respiratory: Negative for shortness of breath.   Cardiovascular: Negative for chest pain.  Gastrointestinal: Positive for anal bleeding and hematochezia. Negative for abdominal pain, constipation, diarrhea, nausea and vomiting.  Neurological: Negative for dizziness, loss of consciousness and syncope.  All other systems reviewed and are negative.    Physical Exam Updated Vital Signs BP (!) 135/56   Pulse (!) 58   Temp 97.7 F (36.5 C) (Oral)   Resp 15   SpO2 98%    Physical Exam  Constitutional: She is oriented to person, place, and time. She appears well-developed and well-nourished.  HENT:  Head: Normocephalic and atraumatic.  Mouth/Throat: No oropharyngeal exudate.  Eyes: Conjunctivae are normal. Pupils are equal, round, and reactive to light.  Neck: Normal range of motion. Neck supple.  Cardiovascular: Normal rate, regular rhythm, normal heart sounds and intact distal pulses.  Pulmonary/Chest: Effort normal and breath sounds normal. No stridor. She has no wheezes. She has no rales.  Abdominal: Soft. Bowel sounds are normal. She exhibits no mass. There is no tenderness. There is no rebound and no guarding.  Genitourinary: Rectal exam shows guaiac positive stool.  Genitourinary Comments: Chaperon present BRB in the rectal vault and on perineal skin  Musculoskeletal: Normal range of motion.  Neurological: She is alert and oriented to person, place, and time. She displays normal reflexes.  Skin: Skin is warm and dry. Capillary refill takes less than 2 seconds. She is not diaphoretic. There is pallor.  Psychiatric: She has a normal mood and affect.  Nursing note and vitals reviewed.    ED Treatments / Results  Labs (all labs ordered are listed, but only abnormal results are displayed)  Results for orders placed or performed during the hospital encounter of 07/13/17  Comprehensive metabolic panel  Result Value Ref Range   Sodium 141 135 - 145 mmol/L   Potassium 4.0 3.5 - 5.1 mmol/L   Chloride 110 101 - 111 mmol/L   CO2 22 22 - 32 mmol/L   Glucose, Bld 157 (H) 65 - 99 mg/dL   BUN 29 (H) 6 - 20 mg/dL   Creatinine, Ser 1.49 (H) 0.44 - 1.00 mg/dL   Calcium 8.9 8.9 - 10.3 mg/dL   Total Protein 7.2 6.5 - 8.1 g/dL   Albumin 3.9 3.5 - 5.0 g/dL   AST 30 15 - 41 U/L   ALT 25 14 - 54 U/L   Alkaline Phosphatase 82 38 - 126 U/L   Total Bilirubin 0.7 0.3 - 1.2 mg/dL   GFR calc non Af Amer 31 (L) >60 mL/min   GFR calc Af Amer 36 (L) >60 mL/min    Anion gap 9 5 - 15  CBC  Result Value Ref Range   WBC 9.5 4.0 - 10.5 K/uL   RBC 4.32 3.87 - 5.11 MIL/uL   Hemoglobin 13.6 12.0 - 15.0 g/dL   HCT 39.4 36.0 - 46.0 %   MCV 91.2 78.0 - 100.0 fL   MCH 31.5 26.0 - 34.0 pg   MCHC 34.5 30.0 - 36.0 g/dL   RDW 13.7 11.5 - 15.5 %   Platelets 191 150 - 400 K/uL  Protime-INR - (order if Patient is taking Coumadin / Warfarin)  Result Value Ref Range   Prothrombin Time 23.3 (H) 11.4 - 15.2 seconds  INR 2.09    Ct Head Wo Contrast  Result Date: 06/26/2017 CLINICAL DATA:  Numbness or tingling and paresthesias. Left arm tingling. Left facial droop. EXAM: CT HEAD WITHOUT CONTRAST TECHNIQUE: Contiguous axial images were obtained from the base of the skull through the vertex without intravenous contrast. COMPARISON:  None. FINDINGS: Brain: Possible punctate lacunar infarcts in the anterior limb of the right basal ganglia, age indeterminate. Age-related cerebral atrophy. No intracranial hemorrhage, mass effect, or midline shift. No hydrocephalus. The basilar cisterns are patent. No extra-axial or intracranial fluid collection. Vascular: Atherosclerosis of skullbase vasculature without hyperdense vessel or abnormal calcification. Skull: No fracture or focal lesion. Sinuses/Orbits: Scattered opacification of ethmoid air cells. No sinus fluid level. Mastoid air cells are clear. Other: None. IMPRESSION: Possible subcentimeter age-indeterminate lacunar infarcts in the right basal ganglia. No hemorrhage or large vessel ischemia. Electronically Signed   By: Jeb Levering M.D.   On: 06/26/2017 03:33   Mr Brain Wo Contrast  Result Date: 06/26/2017 CLINICAL DATA:  84 year old female with left facial droop, left arm tingling. Possible right basal ganglia lacunar infarct on noncontrast head CT earlier today. EXAM: MRI HEAD WITHOUT CONTRAST MRA HEAD WITHOUT CONTRAST TECHNIQUE: Multiplanar, multiecho pulse sequences of the brain and surrounding structures were obtained  without intravenous contrast. Angiographic images of the head were obtained using MRA technique without contrast. COMPARISON:  Noncontrast head CT 0246 hours. FINDINGS: MRI HEAD FINDINGS Brain: Small 7 mm oval area of restricted diffusion in the central right thalamus (series 3, image 25). No associated hemorrhage, T2 FLAIR hyperintensity or mass effect. No other convincing restricted diffusion. No midline shift, mass effect, evidence of mass lesion, ventriculomegaly, extra-axial collection or acute intracranial hemorrhage. Cervicomedullary junction and pituitary are within normal limits. Mild T2 heterogeneity elsewhere in the deep gray matter - including the right basal ganglia - nuclei more resembles small perivascular spaces (normal variant). Mild for age scattered cerebral white matter T2 and FLAIR hyperintensity. No cortical encephalomalacia or chronic cerebral blood products. Negative brainstem and cerebellum. Vascular: Major intracranial vascular flow voids are preserved. Generalized intracranial artery tortuosity. Skull and upper cervical spine: Negative visualized cervical spine. Visualized bone marrow signal is within normal limits. Sinuses/Orbits: Postoperative changes to both globes. Otherwise normal orbits soft tissues. Paranasal sinuses and mastoids are stable and well pneumatized. Other: Visible internal auditory structures appear normal. Scalp and face soft tissues appear negative. MRA HEAD FINDINGS Antegrade flow in the posterior circulation tortuous distal vertebral arteries without stenosis. Right PICA is visible and patent. Patent basilar artery with tortuosity but no stenosis. SCA and PCA origins are normal. The right posterior communicating artery is present, the left might be diminutive or absent. Right PCA P1 segment an bilateral PCA branches are normal. Antegrade flow in both ICA siphons. No siphon stenosis. Normal ophthalmic and posterior communicating artery origins. Patent carotid  termini. Normal MCA and ACA origins. Diminutive anterior communicating artery. Visible ACA branches are within normal limits. MCA M1 segments, MCA bifurcations and visible bilateral MCA branches are within normal limits. IMPRESSION: 1. Small acute lacunar infarct in the central right thalamus. No associated hemorrhage or mass effect. 2. No other acute intracranial abnormality, and otherwise mild for age nonspecific chronic signal changes in the brain. 3. Intracranial artery tortuosity but otherwise negative intracranial MRA. Electronically Signed   By: Genevie Ann M.D.   On: 06/26/2017 12:48   Mr Virgel Paling HK Contrast  Result Date: 06/26/2017 CLINICAL DATA:  84 year old female with left facial droop, left arm tingling. Possible  right basal ganglia lacunar infarct on noncontrast head CT earlier today. EXAM: MRI HEAD WITHOUT CONTRAST MRA HEAD WITHOUT CONTRAST TECHNIQUE: Multiplanar, multiecho pulse sequences of the brain and surrounding structures were obtained without intravenous contrast. Angiographic images of the head were obtained using MRA technique without contrast. COMPARISON:  Noncontrast head CT 0246 hours. FINDINGS: MRI HEAD FINDINGS Brain: Small 7 mm oval area of restricted diffusion in the central right thalamus (series 3, image 25). No associated hemorrhage, T2 FLAIR hyperintensity or mass effect. No other convincing restricted diffusion. No midline shift, mass effect, evidence of mass lesion, ventriculomegaly, extra-axial collection or acute intracranial hemorrhage. Cervicomedullary junction and pituitary are within normal limits. Mild T2 heterogeneity elsewhere in the deep gray matter - including the right basal ganglia - nuclei more resembles small perivascular spaces (normal variant). Mild for age scattered cerebral white matter T2 and FLAIR hyperintensity. No cortical encephalomalacia or chronic cerebral blood products. Negative brainstem and cerebellum. Vascular: Major intracranial vascular flow  voids are preserved. Generalized intracranial artery tortuosity. Skull and upper cervical spine: Negative visualized cervical spine. Visualized bone marrow signal is within normal limits. Sinuses/Orbits: Postoperative changes to both globes. Otherwise normal orbits soft tissues. Paranasal sinuses and mastoids are stable and well pneumatized. Other: Visible internal auditory structures appear normal. Scalp and face soft tissues appear negative. MRA HEAD FINDINGS Antegrade flow in the posterior circulation tortuous distal vertebral arteries without stenosis. Right PICA is visible and patent. Patent basilar artery with tortuosity but no stenosis. SCA and PCA origins are normal. The right posterior communicating artery is present, the left might be diminutive or absent. Right PCA P1 segment an bilateral PCA branches are normal. Antegrade flow in both ICA siphons. No siphon stenosis. Normal ophthalmic and posterior communicating artery origins. Patent carotid termini. Normal MCA and ACA origins. Diminutive anterior communicating artery. Visible ACA branches are within normal limits. MCA M1 segments, MCA bifurcations and visible bilateral MCA branches are within normal limits. IMPRESSION: 1. Small acute lacunar infarct in the central right thalamus. No associated hemorrhage or mass effect. 2. No other acute intracranial abnormality, and otherwise mild for age nonspecific chronic signal changes in the brain. 3. Intracranial artery tortuosity but otherwise negative intracranial MRA. Electronically Signed   By: Genevie Ann M.D.   On: 06/26/2017 12:48   EKG  Date: 07/14/2017  Rate: 58  Rhythm: normal sinus rhythm  QRS Axis: normal  Intervals: normal  ST/T Wave abnormalities: normal  Conduction Disutrbances: none  Narrative Interpretation: unremarkable      Procedures Procedures (including critical care time)  Medications Ordered in ED Medications  sodium chloride 0.9 % bolus 1,000 mL (1,000 mLs Intravenous  New Bag/Given 07/13/17 2351)   Will transfuse 1 unit of emergency blood   Admit to ICU  MDM Reviewed: previous chart, nursing note and vitals Reviewed previous: labs and ECG Interpretation: ECG and labs (slight elevation of creatinine, stable  ) Total time providing critical care: 30-74 minutes (case d/w Dr. Benson [place] of GI who will consult ). This excludes time spent performing separately reportable procedures and services. Consults: critical care and gastrointestinal      Final Clinical Impressions(s) / ED Diagnoses   Final diagnoses:  Bright red blood per rectum  Acute lower GI bleeding    Admit to ICU ad [place], Evertt Chouinard, MD 07/14/17 2778    Randal Buba, Nat Lowenthal, MD 07/14/17 0021

## 2017-07-14 NOTE — Progress Notes (Signed)
Called lab to follow up on stat H&H from 0520. Equipment malfunctioned and they are currently processing it. Results to be posted ASAP

## 2017-07-14 NOTE — H&P (Signed)
PULMONARY / CRITICAL CARE MEDICINE   Name: Helen Wilson MRN: [id number] DOB: Jun 28, 1932    ADMISSION DATE:  07/13/2017 CONSULTATION DATE:  07/14/2016  REFERRING MD:  Dr. Randal Buba  CHIEF COMPLAINT:  Rectal bleeding  HISTORY OF PRESENT ILLNESS:   84 year old female with PMH significant for but not limited to PAF on Xarelto, CAD, CKD stage III, HLD, HTN, and CVA who presented to [facility] ED with complaints of rectal bleeding.  Patient recently placed on Xarelto after recent CVA discharged on 12/16.  Additionally, patient states she had banding of internal hemorrhoids by Dr. Harlow Asa on 12/18 however I can not find any EMR of this.  Last colonoscopy over 20-30 years ago.   Reports onset of rectal bleeding "pouring out" starting around 1600 on 07/13/2016 while at rest.  Patient reports chronic constipation, but denies any current abdominal pain, nausea, vomiting, fever, chills, shortness of breath, chest pain, or syncope.   In the ER, she was initially hypotensive with SBP in the 80's.  She reports being symptomatic with dizziness once while using bedside commode, otherwise denies any complaints. Hgb noted at 13.6.  Due to constant bright red blood with large clots, she was treated with 1L NS, Kcentra, and 1 unit of emergency released PRBC with stabilization of blood pressure.  GI consulted and patient transferred to the ICU at [facility].  PCCM to admit.   PAST MEDICAL HISTORY :  She  has a past medical history of Arthritis, Atrial fibrillation ([place]), Coronary artery disease, Heart murmur, Hemorrhoids, Hyperlipidemia, Hypertension, Macular degeneration, Osteoporosis, and Stroke ([place]).  PAST SURGICAL HISTORY: She  has a past surgical history that includes Abdominal hysterectomy; Breast biopsy; Knee arthroscopy; and LEEP.  No Known Allergies  No current facility-administered medications on file prior to encounter.    Current Outpatient Medications on File Prior to Encounter  Medication Sig  . amLODipine  (NORVASC) 2.5 MG tablet Take 1 tablet (2.5 mg total) by mouth daily. (Patient taking differently: Take 5 mg by mouth daily. )  . Rivaroxaban (XARELTO) 15 MG TABS tablet Take 1 tablet (15 mg total) by mouth daily with supper.  . simvastatin (ZOCOR) 20 MG tablet TAKE ONE TABLET BY MOUTH ONCE DAILY AT BEDTIME  . sotalol (BETAPACE) 120 MG tablet 120 mg 2 (two) times daily.  . traMADol (ULTRAM) 50 MG tablet Take 1 tablet (50 mg total) by mouth every 6 (six) hours as needed.  . triamcinolone cream (KENALOG) 0.1 % APPLY  CREAM TOPICALLY THREE TIMES DAILY    FAMILY HISTORY:  Her indicated that her mother is deceased. She indicated that her father is deceased.   SOCIAL HISTORY: She  reports that  has never smoked. she has never used smokeless tobacco. She reports that she drinks alcohol. She reports that she does not use drugs.  REVIEW OF SYSTEMS:  POSITIVES IN BOLD Gen: Denies fever, chills, weight change, fatigue,  HEENT: Denies blurred vision, double vision, hearing loss PULM: Denies shortness of breath, cough, hemoptysis CV: Denies chest pain, palpitations GI: Denies abdominal pain, nausea, vomiting, diarrhea, hematochezia, melena, constipation, change in bowel habits GU: Denies dysuria, hematuria, polyuria Derm: Denies rash Heme: Denies easy bruising, bleeding, bleeding gums Neuro: Denies headache, slurred speech, loss of memory or consciousness  SUBJECTIVE:  No complaints   VITAL SIGNS: BP 120/70   Pulse 62   Temp 97.7 F (36.5 C) (Oral)   Resp 16   Ht 5\' 3"  (1.6 m)   SpO2 97%   BMI 35.39 kg/m  HEMODYNAMICS:    VENTILATOR SETTINGS:    INTAKE / OUTPUT: No intake/output data recorded.  PHYSICAL EXAMINATION: General:  Pleasant elderly female lying in bed in NAD HEENT: MM pink/moist Neuro: Alert, oriented, MAE CV: rrr, no m/r/g PULM: even/non-labored, CTB GU:YQIH, ND, mild tenderness in LLQ,  +BS Extremities: warm/dry, no edema  Skin: no rashes    LABS:  BMET Recent Labs  Lab 07/13/17 2320  NA 141  K 4.0  CL 110  CO2 22  BUN 29*  CREATININE 1.49*  GLUCOSE 157*    Electrolytes Recent Labs  Lab 07/13/17 2320  CALCIUM 8.9    CBC Recent Labs  Lab 07/13/17 2320  WBC 9.5  HGB 13.6  HCT 39.4  PLT 191    Coag's Recent Labs  Lab 07/13/17 2320  INR 2.09    Sepsis Markers No results for input(s): LATICACIDVEN, PROCALCITON, O2SATVEN in the last 168 hours.  ABG No results for input(s): PHART, PCO2ART, PO2ART in the last 168 hours.  Liver Enzymes Recent Labs  Lab 07/13/17 2320  AST 30  ALT 25  ALKPHOS 82  BILITOT 0.7  ALBUMIN 3.9    Cardiac Enzymes No results for input(s): TROPONINI, PROBNP in the last 168 hours.  Glucose No results for input(s): GLUCAP in the last 168 hours.  Imaging No results found.  STUDIES:  1/2 Tagged red blood study >>  CULTURES: none  ANTIBIOTICS: none  SIGNIFICANT EVENTS: 1/2 Admitted   LINES/TUBES: PIV 18g and 20 g  DISCUSSION: 24 yoF on Xarelto for Afib with recent CVA and possibly banding of internal hemorrhoids presenting with hematochezia.  Initially hypotensive but stable after Kcentra and 1 unit emergency released PRBC.  GI consulted.  Repeat Hgb pending.  Hemodynamically stable and asymptomatic despite continued bleeding and clots.  Request to be DNR.  ASSESSMENT / PLAN:  PULMONARY A: No issues  P:   Monitor  Supplemental O2 prn   CARDIOVASCULAR A:  Hypotension r/t hypovolemia/ acute blood loss- resolved/ stable PAF on Xarelto Hx HTN, HLD P:  Tele monitoring Continue sotalol Hold home norvasc, zocor and xarelto Goal map > 65  RENAL A:   Mild AKI CKD stage 3 S/p 1L NS P:   Trend BMP / urinary output/ daily wts Replace electrolytes as indicated Avoid nephrotoxic agents, ensure adequate renal perfusion  GASTROINTESTINAL A:   LGIB with BRBPR Hx of internal hemorrhoids with ? Recent banding by Dr. Harlow Asa on 12/18 per patient-   do not see EMR record of this  P:   H/H stat then q 6 Stat tagged red blood study, if test is prolonged to complete or H/H is significantly dropped, will then defer to CTA abd/pelvis and consider consulting IR for possible embolization or surgery Dr. Benson Norway with GI aware and will see patient NPO  HEMATOLOGIC A:   Acute blood loss per rectum - s/p Kcentra and 1 unit emergency PRBC P:  H/H stat then q 6 Hold all anticoagulation SCDs for VTE ppx T&S now  INFECTIOUS A:   No acute issues P:   Trend WBC/ monitor fever curve  ENDOCRINE A:   No acute issues P:   Trend glucose on Bmet  NEUROLOGIC A:   Hx recent lacunar infarct 06/27/2017 P:   Monitor  FAMILY  - Updates: No family at bedside.  Patient updated on plan of care. Patient request to be DNR.   - Inter-disciplinary family meet or Palliative Care meeting due by:  07/21/2016.  CCT 40 mins  Brooke  Anders Simmonds [facility] Pulmonary and [place] Pager: [phone]  07/14/2017, 4:15 AM

## 2017-07-14 NOTE — Telephone Encounter (Signed)
Left message on machine that rx was sent to pharmacy on Monday when she here

## 2017-07-14 NOTE — Progress Notes (Signed)
PULMONARY / CRITICAL CARE MEDICINE   Name: Helen Wilson MRN: [id number] DOB: July 08, 1932    ADMISSION DATE:  07/13/2017    CHIEF COMPLAINT: GI bleeding  HISTORY OF PRESENT ILLNESS:      84 year old female with PMH significant for but not limited to PAF on Xarelto, CAD, CKD stage III, HLD, HTN, and CVA who presented to [facility] ED with complaints of rectal bleeding.  Patient recently placed on Xarelto after recent CVA discharged on 12/16.  Additionally, patient states she had banding of internal hemorrhoids by Dr. Harlow Asa on 12/18 however I can not find any EMR of this.  Last colonoscopy over 20-30 years ago.   Reports onset of rectal bleeding "pouring out" starting around 1600 on 07/13/2016 while at rest.  Patient reports chronic constipation, but denies any current abdominal pain, nausea, vomiting, fever, chills, shortness of breath, chest pain, or syncope.   In the ER, she was initially hypotensive with SBP in the 80's.  She reports being symptomatic with dizziness once while using bedside commode, otherwise denies any complaints. Hgb noted at 13.6.  Due to constant bright red blood with large clots, she was treated with 1L NS, Kcentra, and 1 unit of emergency released PRBC with stabilization of blood pressure.  GI consulted and patient transferred to the ICU at [facility].  She again was passing blood this morning and became hypotensive while in nuclear medicine for her GI bleeding scan.  She was transferred back to ICU before completion of the scan.  The blood is clots mixed with fairly bright blood.  She is not having any abdominal pain.     PAST MEDICAL HISTORY :  She  has a past medical history of Arthritis, Atrial fibrillation ([place]), Coronary artery disease, Heart murmur, Hemorrhoids, Hyperlipidemia, Hypertension, Macular degeneration, Osteoporosis, and Stroke ([place]).  PAST SURGICAL HISTORY: She  has a past surgical history that includes Abdominal hysterectomy; Breast biopsy; Knee arthroscopy; and  LEEP.  No Known Allergies  No current facility-administered medications on file prior to encounter.    Current Outpatient Medications on File Prior to Encounter  Medication Sig  . amLODipine (NORVASC) 2.5 MG tablet Take 1 tablet (2.5 mg total) by mouth daily. (Patient taking differently: Take 5 mg by mouth daily. )  . Rivaroxaban (XARELTO) 15 MG TABS tablet Take 1 tablet (15 mg total) by mouth daily with supper.  . simvastatin (ZOCOR) 20 MG tablet TAKE ONE TABLET BY MOUTH ONCE DAILY AT BEDTIME  . sotalol (BETAPACE) 120 MG tablet 120 mg 2 (two) times daily.  . traMADol (ULTRAM) 50 MG tablet Take 1 tablet (50 mg total) by mouth every 6 (six) hours as needed.  . triamcinolone cream (KENALOG) 0.1 % APPLY  CREAM TOPICALLY THREE TIMES DAILY    FAMILY HISTORY:  Her indicated that her mother is deceased. She indicated that her father is deceased.   SOCIAL HISTORY: She  reports that  has never smoked. she has never used smokeless tobacco. She reports that she drinks alcohol. She reports that she does not use drugs.  REVIEW OF SYSTEMS:   Pertinent to her recent history of CVA, her only residual is some paresthesia in the left hand.  She has no motor deficits. She has no history of PND orthopnea, unusual dyspnea, or dependent edema.  SUBJECTIVE:  As above  VITAL SIGNS: BP (!) 121/56   Pulse 61   Temp 98.3 F (36.8 C) (Oral)   Resp 13   Ht 5\' 3"  (1.6 m)  Wt 197 lb 8.5 oz (89.6 kg)   SpO2 96%   BMI 34.99 kg/m   HEMODYNAMICS:    VENTILATOR SETTINGS:    INTAKE / OUTPUT: I/O last 3 completed shifts: In: 1950 [P.O.:60; I.V.:100; Blood:630; IV Piggyback:1160] Out: 120 [Urine:120]  PHYSICAL EXAMINATION: General: This is an elderly female who is in no overt distress despite passing blood.  She is entirely alert and appropriately interactive. Cardiovascular: S1 and S2 are currently regular without rub or gallop.  There is a 3 out of 6 systolic ejection murmur. Lungs: Respirations  are unlabored, there is symmetric air movement, no wheezes. Abdomen: The abdomen is obese soft and nontender.   LABS:  BMET Recent Labs  Lab 07/13/17 2320  NA 141  K 4.0  CL 110  CO2 22  BUN 29*  CREATININE 1.49*  GLUCOSE 157*    Electrolytes Recent Labs  Lab 07/13/17 2320  CALCIUM 8.9    CBC Recent Labs  Lab 07/13/17 2320 07/14/17 0548  WBC 9.5  --   HGB 13.6 11.4*  HCT 39.4 33.4*  PLT 191  --     Coag's Recent Labs  Lab 07/13/17 2320  INR 2.09    Sepsis Markers No results for input(s): LATICACIDVEN, PROCALCITON, O2SATVEN in the last 168 hours.  ABG No results for input(s): PHART, PCO2ART, PO2ART in the last 168 hours.  Liver Enzymes Recent Labs  Lab 07/13/17 2320  AST 30  ALT 25  ALKPHOS 82  BILITOT 0.7  ALBUMIN 3.9    Cardiac Enzymes No results for input(s): TROPONINI, PROBNP in the last 168 hours.  Glucose No results for input(s): GLUCAP in the last 168 hours.  Imaging No results found.    DISCUSSION: This is a 84 year old usually on Xarelto for history of paroxysmal atrial fibrillation and a recent history of CVA.  She presents with GI bleeding 10 days after banding of hemorrhoids.  She passed blood again this morning and was hypotensive and has responded to a normal saline bolus.  ASSESSMENT / PLAN:  CARDIOVASCULAR A: Currently maintaining sinus rhythm, we will continue her sotalol.  Echocardiogram on 12/16 showed an ejection fraction of 60-65% and she has no clinical history of failure symptoms.    GASTROINTESTINAL A: We are obviously holding her Xarelto but we attempt to identify the source of her GI bleeding.  Clinically I suspect it is secondary to hemorrhoids, we are awaiting interpretation of her nuclear medicine images to see if a site is revealed.  GI is seeing in the patient.  In the interim we are monitoring her vital signs and hemoglobins and replacing volume as indicated.  I am also concerned that her INR was elevated  on presentation and observation that usually does not occur with Xarelto alone, I have ordered a repeat determination and if it is still elevated will administer vitamin K.  ENDOCRINE:   I have ordered an A1c in response to a single elevated glucose.  NEUROLOGIC A: Recent history of stroke with residual consisting only of some paresthesias in the left hand.   Lars Masson, MD Pulmonary and [place] Pager: [phone]  07/14/2017, 10:37 AM

## 2017-07-14 NOTE — Op Note (Signed)
[place] [facility] Patient Name: Helen Wilson Procedure Date : 07/14/2017 MRN: [id number] Attending MD: Jerene Bears , MD Date of Birth: [birth year] CSN: [id number] Age: 84 Admit Type: Inpatient Procedure:                Flexible Sigmoidoscopy Indications:              Hematochezia, acute hemorrhagic anemia, recent                            hemorrhoidal band ligation on 06/29/2017 Providers:                Lajuan Lines. Hilarie Fredrickson, MD, Zenon Mayo, RN, Cletis Athens,                            Technician Referring MD:             Critical Care Medicine Medicines:                None Complications:            No immediate complications. Estimated Blood Loss:     Estimated blood loss was minimal. Procedure:                Pre-Anesthesia Assessment:                           - Prior to the procedure, a History and Physical                            was performed, and patient medications and                            allergies were reviewed. The patient's tolerance of                            previous anesthesia was also reviewed. The risks                            and benefits of the procedure and the sedation                            options and risks were discussed with the patient.                            All questions were answered, and informed consent                            was obtained. Prior Anticoagulants: The patient has                            taken Xarelto (rivaroxaban), last dose was 2 days                            prior to procedure. ASA Grade Assessment: III - A  patient with severe systemic disease. After                            reviewing the risks and benefits, the patient was                            deemed in satisfactory condition to undergo the                            procedure.                           After obtaining informed consent, the scope was                            passed under direct vision. The EC-[id number]  ([id number])                            scope was introduced through the anus and advanced                            to the the rectosigmoid junction. The flexible                            sigmoidoscopy was accomplished without difficulty.                            The patient tolerated the procedure well. The                            quality of the bowel preparation was adequate. Scope In: Scope Out: Findings:      A single (solitary) ten mm ulcer was found in the distal rectum. Oozing       was present. Stigmata of recent bleeding were present with visible       vessel near the proximal edge. Area beside the ulcer was successfully       injected with 1 mL of a 1:10,000 solution of epinephrine for hemostasis       (0.5 mL on both sides). For hemostasis, one hemostatic clip was       successful and one hemostatic clip was unsuccessfully placed (MR       conditional). There was no bleeding at the end of the procedure.      Retroflexion in the rectum was not performed. Impression:               - A single (solitary) post-hemorroidal banding                            ulcer in the distal rectum with visible vessel.                            Injected. Clip (MR conditional) was placed.                           - No specimens collected. Moderate Sedation:  N/A Recommendation:           - Return patient to ICU for ongoing care.                           - Closely monitor Hgb. Transfuse as needed.                           - Hold Xarelto. Procedure Code(s):        --- Professional ---                           [phone], 38, Sigmoidoscopy, flexible; with control of                            bleeding, any method Diagnosis Code(s):        --- Professional ---                           K62.6, Ulcer of anus and rectum                           K92.1, Melena (includes Hematochezia) CPT copyright 2016 American Medical Association. All rights reserved. The codes documented in this report are  preliminary and upon coder review may  be revised to meet current compliance requirements. Jerene Bears, MD 07/14/2017 12:25:30 PM This report has been signed electronically. Number of Addenda: 0

## 2017-07-14 NOTE — Progress Notes (Signed)
Referring Provider: Critical Care Medicine Primary Care Physician:  Carollee Herter, Alferd Apa, DO Primary Gastroenterologist:   Erskine Emery (remotely)  Reason for Consultation:  Rectal bleeding  ASSESSMENT AND PLAN:   49. 84 yo female admitted with hemodynamically unstable painless rectal bleeding on Xarelto. She is s/p hemorrhoidal banding ~2-3 weeks ago and Xarelto initiated after that for acute CVA. Suspect bleeding from banding site vrs diverticular bleed. Last Xarelto was Monday. She received KCentra in ED -Needs urgent Flex Sig with control of bleeding if possible. The risks and benefits of the procedure were explained and patient agrees to proceed.  -RN spoke with Nuc Med. Apparently study was complete and no active bleeding seen ( a little confusing as she had major bleeding while down there.    2. ABL anemia. Baseline hgb ~ 14, it was stable at 13.5 in ED. Down to 11.4 ~ 6 am today and that was following a unit of blood around 1am.  -continue serial CBC, check H+H now.   3. CKD3       HPI: Lissie Hinesley is a 84 y.o. female with hx of AFIB and recent CVA ( on 06/26/17) on Xarelto now. No TPA given. She presented to Med Ctr yesterday with complaints of painless rectal bleeding / dizziness.  In ED she was hypotensive with SBP in 80's. She received IVF and KCentera. Her baseline hgb is 14, it was stable at 13.5 in ED, down to 11.4 ~ 6 am today. Of note, patient is s/p hemorrhoidal banding ~ two weeks ago (prior to starting Xarelto she says). She has had several uneventful hemorrhoidal bandings through the years by Dr. Harlow Asa. Never had any post-procedure bleeding until now. She struggles with chronic constipation but occasionally has diarrhea as well. No colonoscopy except for when in high school.   Patient went to [facility] for bleeding scan a short while . No active bleeding for 1.5 hours then patient passed a large fresh blood clot associated with hemodynamic instability (BP in 50's). Rapid  Response called. Resuscitated with IVF.    Past Medical History:  Diagnosis Date  . Arthritis   . Atrial fibrillation ([place])   . Coronary artery disease   . Heart murmur   . Hemorrhoids   . Hyperlipidemia   . Hypertension   . Macular degeneration   . Osteoporosis   . Stroke [facility])     Past Surgical History:  Procedure Laterality Date  . ABDOMINAL HYSTERECTOMY    . BREAST BIOPSY    . KNEE ARTHROSCOPY    . LEEP      Prior to Admission medications   Medication Sig Start Date End Date Taking? Authorizing Provider  amLODipine (NORVASC) 2.5 MG tablet Take 1 tablet (2.5 mg total) by mouth daily. Patient taking differently: Take 5 mg by mouth daily.  09/24/16   Ann Held, DO  Rivaroxaban (XARELTO) 15 MG TABS tablet Take 1 tablet (15 mg total) by mouth daily with supper. 06/27/17   Debbe Odea, MD  simvastatin (ZOCOR) 20 MG tablet TAKE ONE TABLET BY MOUTH ONCE DAILY AT BEDTIME 09/28/16   Carollee Herter, Alferd Apa, DO  sotalol (BETAPACE) 120 MG tablet 120 mg 2 (two) times daily. 12/07/14   [provider]  traMADol (ULTRAM) 50 MG tablet Take 1 tablet (50 mg total) by mouth every 6 (six) hours as needed. 07/12/17   Roma Schanz R, DO  triamcinolone cream (KENALOG) 0.1 % APPLY  CREAM TOPICALLY THREE TIMES DAILY 10/27/16  Roma Schanz R, DO    Current Facility-Administered Medications  Medication Dose Route Frequency Provider Last Rate Last Dose  . 0.9 %  sodium chloride infusion  250 mL Intravenous PRN Jennelle Human B, NP      . 0.9 %  sodium chloride infusion   Intravenous Once Jennelle Human B, NP      . sotalol (BETAPACE) tablet 120 mg  120 mg Oral Q12H Jennelle Human B, NP      . technetium labeled red blood cells (ULTRATAG) injection kit 27.7 millicurie  82.4 millicurie Intravenous Once PRN Abigail Miyamoto, MD        Allergies as of 07/13/2017  . (No Known Allergies)    Family History  Problem Relation Age of Onset  . Parkinsonism Mother   . Rheum  arthritis Mother   . Stroke Father   . Arthritis Unknown   . Heart disease Unknown   . Diabetes Unknown     Social History   Socioeconomic History  . Marital status: Married    Spouse name: Not on file  . Number of children: Not on file  . Years of education: Not on file  . Highest education level: Not on file  Social Needs  . Financial resource strain: Not on file  . Food insecurity - worry: Not on file  . Food insecurity - inability: Not on file  . Transportation needs - medical: Not on file  . Transportation needs - non-medical: Not on file  Occupational History  . Not on file  Tobacco Use  . Smoking status: Never Smoker  . Smokeless tobacco: Never Used  Substance and Sexual Activity  . Alcohol use: Yes    Comment: socially  . Drug use: No  . Sexual activity: No  Other Topics Concern  . Not on file  Social History Narrative  . Not on file    Review of Systems: All systems reviewed and negative except where noted in HPI.  Physical Exam: Vital signs in last 24 hours: Temp:  [97.6 F (36.4 C)-98.3 F (36.8 C)] 98.3 F (36.8 C) (01/02 0400) Pulse Rate:  [58-77] 61 (01/02 0700) Resp:  [13-21] 14 (01/02 0700) BP: (88-138)/(51-116) 104/55 (01/02 0700) SpO2:  [95 %-100 %] 96 % (01/02 0700) Weight:  [197 lb 8.5 oz (89.6 kg)] 197 lb 8.5 oz (89.6 kg) (01/02 0403) Last BM Date: 07/14/17 General:   Alert, well-developed, white female  in NAD Psych:  Pleasant, cooperative. Normal mood and affect. Eyes:  Pupils equal, sclera clear, no icterus.   Conjunctiva pink. Ears:  Normal auditory acuity. Nose:  No deformity, discharge,  or lesions. Neck:  Supple; no masses Lungs:  Clear throughout to auscultation.  Heart:  Regular rate and rhythm,  no edema Abdomen:  Soft, non-distended, nontender, BS active, no palp mass    Rectal:  Deferred. Incontinent of a large fresh blood clot Neurologic:  Alert and  oriented x4;  grossly normal neurologically. Skin:  Intact without  significant lesions or rashes..   Intake/Output from previous day: 01/01 0701 - 01/02 0700 In: 1950 [P.O.:60; I.V.:100; Blood:630; IV Piggyback:1160] Out: 120 [Urine:120] Intake/Output this shift: No intake/output data recorded.  Lab Results: Recent Labs    07/13/17 2320 07/14/17 0548  WBC 9.5  --   HGB 13.6 11.4*  HCT 39.4 33.4*  PLT 191  --    BMET Recent Labs    07/13/17 2320  NA 141  K 4.0  CL 110  CO2 22  GLUCOSE  157*  BUN 29*  CREATININE 1.49*  CALCIUM 8.9   LFT Recent Labs    07/13/17 2320  PROT 7.2  ALBUMIN 3.9  AST 30  ALT 25  ALKPHOS 82  BILITOT 0.7   PT/INR Recent Labs    07/13/17 2320  LABPROT 23.3*  INR 2.09     Tye Savoy, NP-C @  07/14/2017, 9:04 AM  Pager number [phone]

## 2017-07-14 NOTE — ED Notes (Signed)
[facility] transported pt. at this time

## 2017-07-14 NOTE — Telephone Encounter (Signed)
Pharmacy calling again, please advise

## 2017-07-14 NOTE — Progress Notes (Signed)
   07/14/17 1010  Vitals  BP (!) 64/46  MAP (mmHg) (!) 53  Pulse Rate (!) 52  ECG Heart Rate (!) 53  Resp 14  Oxygen Therapy  SpO2 97 %  Pt in Nuc Med c/o being dizzy,OBS sweating, with repeat BPs decreased, Rapid Response called for emergent transport back to ICU, pt started on rapid bolous and placed in trend postion prior to transport. Bleeding Scanned stopped with 10-15 mins left.   Called for MD to meet pt in room, MD at [facility], pt A&Ox4, cont to c/o L shoulder pain.

## 2017-07-15 DIAGNOSIS — K922 Gastrointestinal hemorrhage, unspecified: Secondary | ICD-10-CM

## 2017-07-15 DIAGNOSIS — K626 Ulcer of anus and rectum: Principal | ICD-10-CM

## 2017-07-15 LAB — RENAL FUNCTION PANEL
Albumin: 2.8 g/dL — ABNORMAL LOW (ref 3.5–5.0)
Anion gap: 4 — ABNORMAL LOW (ref 5–15)
BUN: 20 mg/dL (ref 6–20)
CO2: 21 mmol/L — ABNORMAL LOW (ref 22–32)
Calcium: 8.1 mg/dL — ABNORMAL LOW (ref 8.9–10.3)
Chloride: 116 mmol/L — ABNORMAL HIGH (ref 101–111)
Creatinine, Ser: 1.21 mg/dL — ABNORMAL HIGH (ref 0.44–1.00)
GFR calc Af Amer: 47 mL/min — ABNORMAL LOW (ref >60)
GFR calc non Af Amer: 40 mL/min — ABNORMAL LOW (ref >60)
Glucose, Bld: 97 mg/dL (ref 65–99)
Phosphorus: 3.1 mg/dL (ref 2.5–4.6)
Potassium: 3.8 mmol/L (ref 3.5–5.1)
Sodium: 141 mmol/L (ref 135–145)

## 2017-07-15 LAB — TYPE AND SCREEN
ABO/RH(D): O POS
Antibody Screen: NEGATIVE
Unit division: 0

## 2017-07-15 LAB — BPAM RBC
Blood Product Expiration Date: 201901212359
ISSUE DATE / TIME: 201901020028
Unit Type and Rh: 9500

## 2017-07-15 LAB — CBC
HCT: 29.2 % — ABNORMAL LOW (ref 36.0–46.0)
Hemoglobin: 9.8 g/dL — ABNORMAL LOW (ref 12.0–15.0)
MCH: 31.1 pg (ref 26.0–34.0)
MCHC: 33.6 g/dL (ref 30.0–36.0)
MCV: 92.7 fL (ref 78.0–100.0)
Platelets: 122 10*3/uL — ABNORMAL LOW (ref 150–400)
RBC: 3.15 MIL/uL — ABNORMAL LOW (ref 3.87–5.11)
RDW: 14.5 % (ref 11.5–15.5)
WBC: 6.1 10*3/uL (ref 4.0–10.5)

## 2017-07-15 NOTE — Progress Notes (Signed)
[facility] Progress Note  Chief Complaint:    Rectal bleeding  Subjective: feels great. No further bleeding  Objective:  Vital signs in last 24 hours: Temp:  [98.1 F (36.7 C)-98.6 F (37 C)] 98.5 F (36.9 C) (01/03 0800) Pulse Rate:  [52-76] 76 (01/03 0900) Resp:  [10-28] 16 (01/03 0900) BP: (59-154)/(40-87) 154/70 (01/03 0900) SpO2:  [92 %-100 %] 98 % (01/03 0900) Weight:  [197 lb 8.5 oz (89.6 kg)] 197 lb 8.5 oz (89.6 kg) (01/03 0700) Last BM Date: 07/14/17 General:   Alert, well-developed, white female in NAD EENT:  Normal hearing, non icteric sclera, conjunctive pink.  Heart:  Regular rate and rhythm; +loud murmur, nolower extremity edema Pulm: Normal respiratory effort Abdomen:  Soft, nondistended, nontender.  Normal bowel sounds, no masses felt. No hepatomegaly.    Neurologic:  Alert and  oriented x4;  grossly normal neurologically. Psych:  Pleasant, cooperative.  Normal mood and affect.   Intake/Output from previous day: 01/02 0701 - 01/03 0700 In: 360 [P.O.:360] Out: 1550 [Urine:1550] Intake/Output this shift: Total I/O In: -  Out: 350 [Urine:350]  Lab Results: Recent Labs    07/13/17 2320  07/14/17 1112 07/14/17 1600 07/15/17 0257  WBC 9.5  --   --   --  6.1  HGB 13.6   < > 10.3* 9.5* 9.8*  HCT 39.4   < > 30.7* 29.3* 29.2*  PLT 191  --   --   --  122*   < > = values in this interval not displayed.   BMET Recent Labs    07/13/17 2320 07/15/17 0257  NA 141 141  K 4.0 3.8  CL 110 116*  CO2 22 21*  GLUCOSE 157* 97  BUN 29* 20  CREATININE 1.49* 1.21*  CALCIUM 8.9 8.1*   LFT Recent Labs    07/13/17 2320 07/15/17 0257  PROT 7.2  --   ALBUMIN 3.9 2.8*  AST 30  --   ALT 25  --   ALKPHOS 82  --   BILITOT 0.7  --    PT/INR Recent Labs    07/13/17 2320 07/14/17 1112  LABPROT 23.3* 18.9*  INR 2.09 1.59   Nm Gi Blood Loss  Result Date: 07/14/2017 CLINICAL DATA:  Lower GI bleeding. EXAM: NUCLEAR MEDICINE  GASTROINTESTINAL BLEEDING SCAN TECHNIQUE: Sequential abdominal images were obtained following intravenous administration of Tc-3m labeled red blood cells. RADIOPHARMACEUTICALS:  28.9 mCi Tc-55m in-vitro labeled red cells. COMPARISON:  None. FINDINGS: Physiologic distribution of radiopharmaceutical activity is seen. No abnormal sites of radiopharmaceutical activity seen within the abdomen or pelvis. IMPRESSION: Negative.  No evidence of active GI bleeding. Electronically Signed   By: Earle Gell M.D.   On: 07/14/2017 12:02    ASSESSMENT / PLAN:   77. 84 yo female with acute lower GI bleeding secondary to post- hemorrhoid banding ulcer, s/p flex sig with control of bleeding yesterday. No further bleeding -stable for discharge from GI standpoint.   2. ABL. She did receive a unit of bleed early yesterday morning. Hgb has since remained stable at 9.8.   Discharge Planning Diet: No GI restrictions Anticoagulation and antiplatelets: would continue to hold  Xarelto for at least a week Discharge Medications: No GI meds Follow up: prn  Active Problems:   GIB (gastrointestinal bleeding)   GI bleed   Acute lower GI bleeding   Hematochezia   Hemodynamically unstable   Rectal ulcer   Bright red blood per rectum  LOS: 1 day   Tye Savoy ,NP 07/15/2017, 9:39 AM  Pager number [phone]

## 2017-07-15 NOTE — Plan of Care (Signed)
Pt BP will  remain high enough to participate with physical therapy

## 2017-07-15 NOTE — Progress Notes (Signed)
PULMONARY / CRITICAL CARE MEDICINE   Name: Helen Wilson MRN: [id number] DOB: [birth year]    ADMISSION DATE:  07/13/2017    CHIEF COMPLAINT: GI bleeding  HISTORY OF PRESENT ILLNESS:      84 year old female with PMH significant for but not limited to PAF on Xarelto, CAD, CKD stage III, HLD, HTN, and CVA who presented to [facility] ED with complaints of rectal bleeding.  Patient recently placed on Xarelto after recent CVA discharged on 12/16.  Additionally, patient states she had banding of internal hemorrhoids by Dr. Harlow Asa on 12/18 however I can not find any EMR of this.  Last colonoscopy over 20-30 years ago.   Reports onset of rectal bleeding "pouring out" starting around 1600 on 07/13/2016 while at rest.  Patient reports chronic constipation, but denies any current abdominal pain, nausea, vomiting, fever, chills, shortness of breath, chest pain, or syncope.   In the ER, she was initially hypotensive with SBP in the 80's.  She reports being symptomatic with dizziness once while using bedside commode, otherwise denies any complaints. Hgb noted at 13.6.  Due to constant bright red blood with large clots, she was treated with 1L NS, Kcentra, and 1 unit of emergency released PRBC with stabilization of blood pressure.  GI consulted and patient transferred to the ICU at [facility].  On 1/2 she was found to have a 10 mm rectal ulcer with stigmata of bleeding and an exposed vessel.  The area was injected with epinephrine and clipped.  Since that procedure she has had no further rectal bleeding overnight.  She has no complaint of abdominal pain. Hemoglobin during this admission dropped from 13.6 to 9.8.     PAST MEDICAL HISTORY :  She  has a past medical history of Arthritis, Atrial fibrillation ([place]), Coronary artery disease, Heart murmur, Hemorrhoids, Hyperlipidemia, Hypertension, Macular degeneration, Osteoporosis, and Stroke ([place]).  PAST SURGICAL HISTORY: She  has a past surgical history that includes  Abdominal hysterectomy; Breast biopsy; Knee arthroscopy; and LEEP.  No Known Allergies  No current facility-administered medications on file prior to encounter.    Current Outpatient Medications on File Prior to Encounter  Medication Sig  . amLODipine (NORVASC) 2.5 MG tablet Take 1 tablet (2.5 mg total) by mouth daily. (Patient taking differently: Take 5 mg by mouth daily. )  . meloxicam (MOBIC) 15 MG tablet Take 15 mg by mouth daily.  . Rivaroxaban (XARELTO) 15 MG TABS tablet Take 1 tablet (15 mg total) by mouth daily with supper.  . simvastatin (ZOCOR) 20 MG tablet TAKE ONE TABLET BY MOUTH ONCE DAILY AT BEDTIME  . sotalol (BETAPACE) 120 MG tablet Take 120 mg by mouth 2 (two) times daily.   Marland Kitchen triamcinolone cream (KENALOG) 0.1 % APPLY  CREAM TOPICALLY THREE TIMES DAILY  . traMADol (ULTRAM) 50 MG tablet Take 1 tablet (50 mg total) by mouth every 6 (six) hours as needed.    FAMILY HISTORY:  Her indicated that her mother is deceased. She indicated that her father is deceased.   SOCIAL HISTORY: She  reports that  has never smoked. she has never used smokeless tobacco. She reports that she drinks alcohol. She reports that she does not use drugs.  REVIEW OF SYSTEMS:   Pertinent to her recent history of CVA, her only residual is some paresthesia in the left hand.  She has no motor deficits. She has no history of PND orthopnea, unusual dyspnea, or dependent edema.  SUBJECTIVE:  As above  VITAL SIGNS: BP Marland Kitchen)  111/53   Pulse (!) 59   Temp 98.5 F (36.9 C) (Oral)   Resp (!) 22   Ht 5\' 3"  (1.6 m)   Wt 197 lb 8.5 oz (89.6 kg)   SpO2 96%   BMI 34.99 kg/m   HEMODYNAMICS:    VENTILATOR SETTINGS:    INTAKE / OUTPUT: I/O last 3 completed shifts: In: 2310 [P.O.:420; I.V.:100; Blood:630; IV Piggyback:1160] Out: 4098 [Urine:1670]  PHYSICAL EXAMINATION: General: This is an elderly female who is in no overt distress.  She is very alert and interactive.  Cardiovascular: S1 and S2 are  currently regular without rub or gallop.  There is a 3 out of 6 systolic ejection murmur. Lungs: Respirations are unlabored, there is symmetric air movement, no wheezes. Abdomen: The abdomen is obese soft and nontender.   LABS:  BMET Recent Labs  Lab 07/13/17 2320 07/15/17 0257  NA 141 141  K 4.0 3.8  CL 110 116*  CO2 22 21*  BUN 29* 20  CREATININE 1.49* 1.21*  GLUCOSE 157* 97    Electrolytes Recent Labs  Lab 07/13/17 2320 07/15/17 0257  CALCIUM 8.9 8.1*  PHOS  --  3.1    CBC Recent Labs  Lab 07/13/17 2320  07/14/17 1112 07/14/17 1600 07/15/17 0257  WBC 9.5  --   --   --  6.1  HGB 13.6   < > 10.3* 9.5* 9.8*  HCT 39.4   < > 30.7* 29.3* 29.2*  PLT 191  --   --   --  122*   < > = values in this interval not displayed.    Coag's Recent Labs  Lab 07/13/17 2320 07/14/17 1112  INR 2.09 1.59    Sepsis Markers No results for input(s): LATICACIDVEN, PROCALCITON, O2SATVEN in the last 168 hours.  ABG No results for input(s): PHART, PCO2ART, PO2ART in the last 168 hours.  Liver Enzymes Recent Labs  Lab 07/13/17 2320 07/15/17 0257  AST 30  --   ALT 25  --   ALKPHOS 82  --   BILITOT 0.7  --   ALBUMIN 3.9 2.8*    Cardiac Enzymes No results for input(s): TROPONINI, PROBNP in the last 168 hours.  Glucose No results for input(s): GLUCAP in the last 168 hours.  Imaging Nm Gi Blood Loss  Result Date: 07/14/2017 CLINICAL DATA:  Lower GI bleeding. EXAM: NUCLEAR MEDICINE GASTROINTESTINAL BLEEDING SCAN TECHNIQUE: Sequential abdominal images were obtained following intravenous administration of Tc-58m labeled red blood cells. RADIOPHARMACEUTICALS:  28.9 mCi Tc-22m in-vitro labeled red cells. COMPARISON:  None. FINDINGS: Physiologic distribution of radiopharmaceutical activity is seen. No abnormal sites of radiopharmaceutical activity seen within the abdomen or pelvis. IMPRESSION: Negative.  No evidence of active GI bleeding. Electronically Signed   By: Earle Gell  M.D.   On: 07/14/2017 12:02      DISCUSSION: This is a 84 year old usually on Xarelto for history of paroxysmal atrial fibrillation and a recent history of CVA.  She presents with GI bleeding 10 days after banding of hemorrhoids.  A 10 mm rectal ulcer was identified on 1/2 and it was injected with epinephrine and clipped.  She has had no further bleeding since that time and is stable for transfer out of the intensive care unit.   ASSESSMENT / PLAN:  CARDIOVASCULAR A: Currently maintaining sinus rhythm, we will continue her sotalol.  Echocardiogram on 12/16 showed an ejection fraction of 60-65% and she has no clinical history of failure symptoms.    GASTROINTESTINAL A: She  has had no further clinical bleeding since treatment of her rectal ulcer.  I am going to transfer her out of the intensive care unit today anticipating another 24 hours of observation before discharge to home.  ENDOCRINE:   I  ordered an A1c in response to a single elevated glucose.  It was only slightly elevated at 5.7  NEUROLOGIC A: Recent history of stroke with residual consisting only of some paresthesias in the left hand.   Lars Masson, MD Pulmonary and [place] Pager: [phone]  07/15/2017, 10:49 AM

## 2017-07-16 ENCOUNTER — Encounter: Payer: Self-pay | Admitting: *Deleted

## 2017-07-16 ENCOUNTER — Other Ambulatory Visit: Payer: Self-pay

## 2017-07-16 LAB — CBC WITH DIFFERENTIAL/PLATELET
Basophils Absolute: 0 10*3/uL (ref 0.0–0.1)
Basophils Relative: 1 %
Eosinophils Absolute: 0.4 10*3/uL (ref 0.0–0.7)
Eosinophils Relative: 6 %
HCT: 29.2 % — ABNORMAL LOW (ref 36.0–46.0)
Hemoglobin: 9.5 g/dL — ABNORMAL LOW (ref 12.0–15.0)
Lymphocytes Relative: 38 %
Lymphs Abs: 2.3 10*3/uL (ref 0.7–4.0)
MCH: 29.9 pg (ref 26.0–34.0)
MCHC: 32.5 g/dL (ref 30.0–36.0)
MCV: 91.8 fL (ref 78.0–100.0)
Monocytes Absolute: 0.5 10*3/uL (ref 0.1–1.0)
Monocytes Relative: 8 %
Neutro Abs: 2.7 10*3/uL (ref 1.7–7.7)
Neutrophils Relative %: 47 %
Platelets: 142 10*3/uL — ABNORMAL LOW (ref 150–400)
RBC: 3.18 MIL/uL — ABNORMAL LOW (ref 3.87–5.11)
RDW: 14 % (ref 11.5–15.5)
WBC: 5.9 10*3/uL (ref 4.0–10.5)

## 2017-07-16 LAB — CK TOTAL AND CKMB (NOT AT ARMC)
CK, MB: 1 ng/mL (ref 0.5–5.0)
Relative Index: INVALID (ref 0.0–2.5)
Total CK: 43 U/L (ref 38–234)

## 2017-07-16 LAB — TROPONIN I: Troponin I: <0.03 ng/mL (ref <0.03)

## 2017-07-16 MED ORDER — RIVAROXABAN 15 MG PO TABS: 15.0000 mg | ORAL_TABLET | Freq: Every day | ORAL | 0 refills | Status: DC

## 2017-07-16 MED ORDER — GI COCKTAIL ~~LOC~~
30.0000 mL | Freq: Once | ORAL | Status: DC
  Filled 2017-07-16: qty 30

## 2017-07-16 NOTE — Discharge Instructions (Signed)
Lower Gastrointestinal Bleeding Lower gastrointestinal (GI) bleeding is the result of bleeding from the colon, rectum, or anal area. The colon is the last part of the digestive tract, where stool, also called feces, is formed. If you have lower GI bleeding, you may see blood in or on your stool. It may be bright red. Lower GI bleeding often stops without treatment. Continued or heavy bleeding needs emergency treatment at the hospital. What are the causes? Lower GI bleeding may be caused by:  A condition that causes pouches to form in the colon over time (diverticulosis).  Swelling and irritation (inflammation) in areas with diverticulosis (diverticulitis).  Inflammation of the colon (inflammatory bowel disease).  Swollen veins in the rectum (hemorrhoids).  Painful tears in the anus (anal fissures), often caused by passing hard stools.  Cancer of the colon or rectum.  Noncancerous growths (polyps) of the colon or rectum.  A bleeding disorder that impairs the formation of blood clots and causes easy bleeding (coagulopathy).  An abnormal weakening of a blood vessel where an artery and a vein come together (arteriovenous malformation).  What increases the risk? You are more likely to develop this condition if:  You are older than 82 years of age.  You take aspirin or NSAIDs on a regular basis.  You take anticoagulant or antiplatelet drugs.  You have a history of high-dose X-ray treatment (radiation therapy) of the colon.  You recently had a colon polyp removed.  What are the signs or symptoms? Symptoms of this condition include:  Bright red blood or blood clots coming from your rectum.  Bloody stools.  Black or maroon-colored stools.  Pain or cramping in the abdomen.  Weakness or dizziness.  Racing heartbeat.  How is this diagnosed? This condition may be diagnosed based on:  Your symptoms and medical history.  A physical exam. During the exam, your health care  provider will check for signs of blood loss, such as low blood pressure and a rapid pulse.  Tests, such as: ? Flexible sigmoidoscopy. In this procedure, a flexible tube with a camera on the end is used to examine your anus and the first part of your colon to look for the source of bleeding. ? Colonoscopy. This is similar to a flexible sigmoidoscopy, but the camera can extend all the way to the uppermost part of your colon. ? Blood tests to measure your red blood cell count and to check for coagulopathy. ? An imaging study of your colon to look for a bleeding site. In some cases, you may have X-rays taken after a dye or radioactive substance is injected into your bloodstream (angiogram).  How is this treated? Treatment for this condition depends on the cause of the bleeding. Heavy or persistent bleeding is treated at the hospital. Treatment may include:  Getting fluids through an IV tube inserted into one of your veins.  Getting blood through an IV tube (blood transfusion).  Stopping bleeding through high-heat coagulation, injections of certain medicines, or applying surgical clips. This can all be done during a colonoscopy.  Having a procedure that involves first doing an angiogram and then blocking blood flow to the bleeding site (embolization).  Stopping some of your regular medicines for a certain amount of time.  Having surgery to remove part of the colon. This may be needed if bleeding is severe and does not respond to other treatment.  Follow these instructions at home:  Take over-the-counter and prescription medicines only as told by your health care provider.   You may need to avoid aspirin, NSAIDs, or other medicines that increase bleeding.  Eat foods that are high in fiber. This will help keep your stools soft. These foods include whole grains, legumes, fruits, and vegetables. Eating 1-3 prunes each day works well for many people.  Drink enough fluid to keep your urine clear or  pale yellow.  Keep all follow-up visits as told by your health care provider. This is important. Contact a health care provider if:  Your symptoms do not improve. Get help right away if:  Your bleeding increases.  You feel light-headed or you faint.  You feel weak.  You have severe cramps in your back or abdomen.  You pass large blood clots in your stool.  Your symptoms get worse. This information is not intended to replace advice given to you by your health care provider. Make sure you discuss any questions you have with your health care provider. Document Released: 11/14/2015 Document Revised: 12/05/2015 Document Reviewed: 11/14/2015 Elsevier Interactive Patient Education  2018 Elsevier Inc.  

## 2017-07-16 NOTE — Progress Notes (Signed)
D/w Dr Pearline Cables ccm MD from  yestrday - patient now triad and ccm off   Dr. Brand Males, M.D., [facility].C.P Pulmonary and Critical Care Medicine Staff Physician, [place] Director - Interstitial Lung Disease  Program  Pulmonary [place] at [place], [place], [address]  Pager: [phone], If no answer or between  15:00h - 7:00h: call [phone] Telephone: [phone]

## 2017-07-16 NOTE — Consult Note (Signed)
   [facility] CM Inpatient Consult   07/16/2017  Helen Wilson [birth year] [id number]   Patient screened for [place] Management services due to hospital readmission. Noted that she goes to Primary Care Provider office that is listed to perform post hospital discharge calls.   Spoke with Helen Wilson at bedside to discuss [place] Management services. She states " Why didn't anyone call me after I discharged from the hospital last time? I would have needed it". Explained to Helen Wilson that she was on writer's radar this time due to her hospital readmission.  Helen Wilson is agreeable to [place] Management services and written consent obtained. Provided [facility] Care Management folder as well.   Helen Wilson lives alone.  Most of her family lives in [place]. However, she does have a son that lives locally. Helen Wilson is usually very independent. States this recent bleeding episode as thrown her for a loop.  Confirmed Primary Care MD is Dr. Carollee Herter.  Best contact number for Helen Wilson is [phone]. Helen Wilson states she usually drives herself to MD appointments.  Denies concerns with medications.   Agreeable to [place] contacting her post hospital discharge.  Will alert contact person at PCP office that [place] Management will follow post discharge due to hospital readmission.   Helen Wilson has a history of CAD, HTN, HLD, afib, CKD.   Inpatient RNCM made aware [facility] Care Management to follow post discharge.   Marthenia Rolling, MSN-Ed, RN,BSN [facility] Liaison [phone]

## 2017-07-16 NOTE — Progress Notes (Signed)
Patient was given discharge instructions and verbalized understanding. Patient left unit via wheelchair in stable condition with family and nursing staff.

## 2017-07-17 ENCOUNTER — Encounter (HOSPITAL_COMMUNITY): Payer: Self-pay | Admitting: Internal Medicine

## 2017-07-19 ENCOUNTER — Ambulatory Visit: Payer: Self-pay | Admitting: *Deleted

## 2017-07-19 ENCOUNTER — Encounter: Payer: Self-pay | Admitting: Medical

## 2017-07-19 ENCOUNTER — Other Ambulatory Visit: Payer: Self-pay

## 2017-07-19 ENCOUNTER — Ambulatory Visit (INDEPENDENT_AMBULATORY_CARE_PROVIDER_SITE_OTHER): Payer: Medicare Other | Admitting: Medical

## 2017-07-19 VITALS — BP 130/60 | HR 66 | Temp 98.0°F | Resp 16 | Ht 62.0 in | Wt 196.8 lb

## 2017-07-19 DIAGNOSIS — K2971 Gastritis, unspecified, with bleeding: Secondary | ICD-10-CM

## 2017-07-19 DIAGNOSIS — R42 Dizziness and giddiness: Secondary | ICD-10-CM

## 2017-07-19 DIAGNOSIS — R197 Diarrhea, unspecified: Secondary | ICD-10-CM

## 2017-07-19 LAB — COMPREHENSIVE METABOLIC PANEL
ALT: 17 U/L (ref 0–35)
AST: 23 U/L (ref 0–37)
Albumin: 3.7 g/dL (ref 3.5–5.2)
Alkaline Phosphatase: 65 U/L (ref 39–117)
BUN: 29 mg/dL — ABNORMAL HIGH (ref 6–23)
CO2: 26 mEq/L (ref 19–32)
Calcium: 9.1 mg/dL (ref 8.4–10.5)
Chloride: 111 mEq/L (ref 96–112)
Creatinine, Ser: 1.5 mg/dL — ABNORMAL HIGH (ref 0.40–1.20)
GFR: 35.21 mL/min — ABNORMAL LOW (ref >60.00)
Glucose, Bld: 118 mg/dL — ABNORMAL HIGH (ref 70–99)
Potassium: 4.2 mEq/L (ref 3.5–5.1)
Sodium: 143 mEq/L (ref 135–145)
Total Bilirubin: 0.6 mg/dL (ref 0.2–1.2)
Total Protein: 6.1 g/dL (ref 6.0–8.3)

## 2017-07-19 LAB — CBC WITH DIFFERENTIAL/PLATELET
Basophils Absolute: 0.1 10*3/uL (ref 0.0–0.1)
Basophils Relative: 1 % (ref 0.0–3.0)
Eosinophils Absolute: 0.3 10*3/uL (ref 0.0–0.7)
Eosinophils Relative: 3.6 % (ref 0.0–5.0)
HCT: 32.4 % — ABNORMAL LOW (ref 36.0–46.0)
Hemoglobin: 10.6 g/dL — ABNORMAL LOW (ref 12.0–15.0)
Lymphocytes Relative: 26.9 % (ref 12.0–46.0)
Lymphs Abs: 1.9 10*3/uL (ref 0.7–4.0)
MCHC: 32.7 g/dL (ref 30.0–36.0)
MCV: 96.2 fl (ref 78.0–100.0)
Monocytes Absolute: 0.7 10*3/uL (ref 0.1–1.0)
Monocytes Relative: 9.6 % (ref 3.0–12.0)
Neutro Abs: 4.2 10*3/uL (ref 1.4–7.7)
Neutrophils Relative %: 58.9 % (ref 43.0–77.0)
Platelets: 177 10*3/uL (ref 150.0–400.0)
RBC: 3.37 Mil/uL — ABNORMAL LOW (ref 3.87–5.11)
RDW: 14.4 % (ref 11.5–15.5)
WBC: 7.1 10*3/uL (ref 4.0–10.5)

## 2017-07-19 MED ORDER — METRONIDAZOLE 500 MG PO TABS: 500.0000 mg | ORAL_TABLET | Freq: Two times a day (BID) | ORAL | 0 refills | Status: DC

## 2017-07-19 NOTE — Patient Outreach (Addendum)
[facility]) Care Management  07/19/2017  Sedonia Kitner 1932/05/04 [id number]     Transition of Care Referral  Referral Date: 07/16/17  Referral Source: [facility] Liaison Date of Admission: 07/13/17 Diagnosis: "bright red blood per rectum" Date of Discharge: 07/16/17 Facility: [facility] Insurance: Medicare    Outreach attempt # 1 to patient. Spoke with patient who voices she is just waking up from a nap. She freely reported she is having issues keeping her "BP up and foods down." She has an appt with MD within the hour and has to prepare to leave shortly. Patient unable to complete call at this time. Advised that RN CM would call back at another time. She voiced understanding.     Plan: RN CM will make outreach attempt to patient within one business day.    Enzo Montgomery, RN,BSN,CCM [place] Management Telephonic Care Management Coordinator Direct Phone: [phone] Toll Free: [phone] Fax: [phone]

## 2017-07-19 NOTE — Patient Instructions (Addendum)
Your recent hospitalization for anemia/secondary to GI bleed and recent diarrhea post hospitalization causes concern for possible anemia and or dehydration as reason for possibly mild low blood pressure levels.  However your blood pressure readings when I checked today were not as low as your machines.  I am suspicious that your machine might be occasionally getting inaccurate readings as it is a 84 year old machine.   We need to get a stat CBC and metabolic panel today.  With your recent diarrhea, we need to do stool studies to make sure no C. difficile or other infectious cause present.  Would recommend that you hydrate well with propel and eat bland foods.  Can use Imodium right ear over-the-counter for diarrhea.  If you continue to have watery stools by tomorrow night would go ahead and recommend you starting Flagyl.  There is some delay in getting stool panel results back.  Check your blood pressure 1-2 times a day with your new machine.  Low diastolic in your age group can be common.  But I would not expect dramatic drop in your systolic/top number.  Follow-up Thursday early morning or early afternoon(as needed as well)

## 2017-07-19 NOTE — Telephone Encounter (Signed)
Pt D/Ced from hospital this past Friday 07/19/17 for "bleeding hemorrhoids."  Was on Xarelto after CVA 2 weeks ago. Pt reports hypotension over weekend 69/29 to 85/39 with dizziness. This AM 123/60 - 135/68.  During call 136/70. HR 60.  Pt also reports loose stools, "everything goes right through me." Would like BP check in office.  Reason for Disposition . Wants doctor to measure BP  Answer Assessment - Initial Assessment Questions 1. BLOOD PRESSURE: "What is the blood pressure?" "Did you take at least two measurements 5 minutes apart?"      Over weekend reports B/P 69/29 to 85/39, lightheaded. Yes this AM..Marland Kitchen     124/69, 123/60.  During call 136/70. 2. ONSET: "When did you take your blood pressure?"    This am and during call 3. HOW: "How did you obtain the blood pressure?" (e.g., visiting nurse, automatic home BP monitor)     Home monitor 4. HISTORY: "Do you have a history of low blood pressure?" "What is your blood pressure normally?"    No. Unsure of baseline 5. MEDICATIONS: "Are you taking any medications for blood pressure?" If yes: "Have they been changed recently?"     No. "They took me off everything." 6. PULSE RATE: "Do you know what your pulse rate is?"      60 7. OTHER SYMPTOMS: "Have you been sick recently?" "Have you had a recent injury?"     Released from hospital Friday 07/16/17 bleeding hemmoroids. CVA 2 weeks prior, placed on Xarelto.  Protocols used: LOW BLOOD PRESSURE-A-AH

## 2017-07-19 NOTE — Progress Notes (Signed)
Subjective:    Patient ID: Merlie Noga, female    DOB: 12-May-1932, 84 y.o.   MRN: [id number]  HPI  Pt in for follow up from hospitaliztion. She had some possible  internal hemorrhoids that bled but on further work up while in hospital  found 10 mm ulcer found by Dr. Hilarie Fredrickson.  On admission:  84 year old female with a past medical history of atrial fibrillation on Xarelto, internal hemorrhoids, hyperlipidemia, hypertension, presents to [facility] with complaints of rectal bleeding.  Patient stated that when bleeding began  initially it was melena but as it progressed to just turned into clots.  Patient denies abdominal pain fevers chills nausea vomiting headaches.  She does report having some episodes of dizziness subsided when she was laying down in bed.  Patient was transferred from [facility] to [facility] for management of the lower GI bleed PCCM consulted.  Assessment and plan: 1.  Lower GI bleed secondary to internal hemorrhoids versus diverticular bleed on anticoagulation exact source unknown-> recommend bleeding scan vs CTA.  Consult to GI, appreciate their recommendations.  Patient is followed by surgery and had outpatient hemorrhoid banding 10 days ago with no complications.  She denies any previous episodes of similar nature. Hgb stable pt of has received blood transfusion and was given Kcentra for reversal 2.  Hemodynamically stable not requiring vasopressors  at this time, as hemoglobin noted to be 13.6 patient did receive a transfusion of PRBCs.  Recommend transfusions for hemoglobin less than 7 or in the event that there is active bleeding and patient becomes hemodynamically unstable.Marland Kitchen   GI MD procedure note read   A single (solitary) ten mm ulcer was found in the distal rectum. Oozing       was present. Stigmata of recent bleeding were present with visible       vessel near the proximal edge. Area beside the ulcer was successfully       injected with  1 mL of a 1:10,000 solution of epinephrine for hemostasis       (0.5 mL on both sides). For hemostasis, one hemostatic clip was       successful and one hemostatic clip was unsuccessfully placed (MR       conditional). There was no bleeding at the end of the procedure.      Retroflexion in the rectum was not performed. Impression:               - A single (solitary) post-hemorroidal banding                            ulcer in the distal rectum with visible vessel.                            Injected. Clip (MR conditional) was placed.                           - No specimens collected.   Pt was discharged from hospital on Friday. Pt since then has had some loose stools. Last 24 hours watery loose stools. Some watery stools on Saturday. No vomiting.   Pt states she basically feels fine. Feels light headed sensation rare and intermitent. States light headed at times even sitting.  No bright red blood in stool or black stools.     Review of Systems  Constitutional: Negative for chills, fatigue and fever.  Respiratory: Negative for cough, choking, chest tightness, shortness of breath and wheezing.   Cardiovascular: Negative for chest pain and palpitations.  Gastrointestinal: Negative for abdominal distention, abdominal pain, constipation, diarrhea, nausea and vomiting.  Musculoskeletal: Negative for arthralgias, back pain, myalgias and neck pain.  Skin: Negative for rash.  Neurological: Positive for dizziness. Negative for seizures, syncope, speech difficulty, weakness and headaches.  Hematological: Negative for adenopathy. Does not bruise/bleed easily.  Psychiatric/Behavioral: Negative for confusion, decreased concentration, sleep disturbance and suicidal ideas. The patient is not nervous/anxious.     Past Medical History:  Diagnosis Date  . Arthritis   . Atrial fibrillation ([place])   . Coronary artery disease   . Heart murmur   . Hemorrhoids   . Hyperlipidemia   . Hypertension     . Macular degeneration   . Osteoporosis   . Stroke [facility])      Social History   Socioeconomic History  . Marital status: Married    Spouse name: Not on file  . Number of children: Not on file  . Years of education: Not on file  . Highest education level: Not on file  Social Needs  . Financial resource strain: Not on file  . Food insecurity - worry: Not on file  . Food insecurity - inability: Not on file  . Transportation needs - medical: Not on file  . Transportation needs - non-medical: Not on file  Occupational History  . Not on file  Tobacco Use  . Smoking status: Never Smoker  . Smokeless tobacco: Never Used  Substance and Sexual Activity  . Alcohol use: Yes    Comment: socially  . Drug use: No  . Sexual activity: No  Other Topics Concern  . Not on file  Social History Narrative  . Not on file    Past Surgical History:  Procedure Laterality Date  . ABDOMINAL HYSTERECTOMY    . BREAST BIOPSY    . FLEXIBLE SIGMOIDOSCOPY N/A 07/14/2017   Procedure: FLEXIBLE SIGMOIDOSCOPY;  Surgeon: Jerene Bears, MD;  Location: [facility] ENDOSCOPY;  Service: Gastroenterology;  Laterality: N/A;  . KNEE ARTHROSCOPY    . LEEP      Family History  Problem Relation Age of Onset  . Parkinsonism Mother   . Rheum arthritis Mother   . Stroke Father   . Arthritis Unknown   . Heart disease Unknown   . Diabetes Unknown     No Known Allergies  Current Outpatient Medications on File Prior to Visit  Medication Sig Dispense Refill  . simvastatin (ZOCOR) 20 MG tablet TAKE ONE TABLET BY MOUTH ONCE DAILY AT BEDTIME 90 tablet 3  . sotalol (BETAPACE) 120 MG tablet Take 120 mg by mouth 2 (two) times daily.   1  . traMADol (ULTRAM) 50 MG tablet Take 1 tablet (50 mg total) by mouth every 6 (six) hours as needed. 30 tablet 1  . triamcinolone cream (KENALOG) 0.1 % APPLY  CREAM TOPICALLY THREE TIMES DAILY 80 g 5   No current facility-administered medications on file prior to visit.     BP 130/60  Comment: 118/60 on standing  Pulse 66   Temp 98 F (36.7 C) (Oral)   Resp 16   Ht 5\' 2"  (1.575 m)   Wt 196 lb 12.8 oz (89.3 kg)   SpO2 97%   BMI 36.00 kg/m       Objective:   Physical Exam  General Mental Status- Alert. General Appearance- Not  in acute distress.   Skin General: Color- Normal Color. Moisture- mild dry feel to skin  Neck Carotid Arteries- Normal color. Moisture- Normal Moisture. No carotid bruits. No JVD.  Chest and Lung Exam Auscultation: Breath Sounds:-Normal.  Cardiovascular Auscultation:Rythm- Regular. Murmurs & Other Heart Sounds:Auscultation of the heart reveals- No Murmurs.  Abdomen Inspection:-Inspeection Normal. Palpation/Percussion:Note:No mass. Palpation and Percussion of the abdomen reveal- Non Tender, Non Distended + BS, no rebound or guarding.    Neurologic Cranial Nerve exam:- CN III-XII intact(No nystagmus), symmetric smile. Drift Test:- No drift. Romberg Exam:- Negative.  Finger to Nose:- Normal/Intact Strength:- 5/5 equal and symmetric strength both upper and lower extremities.      Assessment & Plan:  Your recent hospitalization for anemia/secondary to GI bleed and recent diarrhea post hospitalization, causes concern for possible anemia and or dehydration as reason for possibly mild low blood pressure levels.  However your blood pressure readings when I checked today were not as low as your machines.  I am suspicious that your machine might be occasionally getting inaccurate readings as it is a 84 year old machine.   We need to get a stat CBC and metabolic panel today.  With your recent diarrhea, we need to do stool studies to make sure no C. difficile or other infectious cause present.  Would recommend that you hydrate well with propel and eat bland foods.  Can use Imodium right ear over-the-counter for diarrhea.  If you continue to have watery stools by tomorrow night would go ahead and recommend you starting Flagyl.  There is  some delay in getting stool panel results back.  Check your blood pressure 1-2 times a day with your new machine.  Low diastolic in your age group can be common.  But I would not expect dramatic drop in your systolic/top number.  Follow-up Thursday early morning or early afternoon(as needed as well)  Kinzleigh Kandler, Percell Miller, [employer]

## 2017-07-20 ENCOUNTER — Other Ambulatory Visit: Payer: Self-pay

## 2017-07-20 NOTE — Patient Outreach (Signed)
[place] [facility]) Care Management  07/20/2017  Helen Wilson 1932-03-09 [id number]   Transition of Care Referral  Referral Date: 07/16/17  Referral Source: [facility] Liaison Date of Admission: 07/13/17 Diagnosis: "bright red blood per rectum" Date of Discharge: 07/16/17 Facility: [facility] Insurance: Medicare    Outreach attempt #2 to patient. Spoke with patient who freely voiced she is feeling better and finally has an appetite and was cooking and about to eat breakfast. Advised patient that call would be a little lengthy in order to complete assessment and she voiced that this was not a good time to talk at present and she would have to be called back later.      Plan: RN CM will make outreach attempt to patient within one business day.    Enzo Montgomery, RN,BSN,CCM [place] Management Telephonic Care Management Coordinator Direct Phone: [phone] Toll Free: [phone] Fax: [phone]

## 2017-07-20 NOTE — Patient Outreach (Addendum)
[place] [facility]) Care Management   [place]  07/20/2017   Helen Wilson 08/21/1931 [id number]   Transition of Care Referral  Referral Date:07/16/17 Referral [facility] Liaison Date of Admission:07/13/17 Diagnosis:"bright red blood per rectum" Date of Discharge:07/16/17 Facility:[facility] Insurance:Medicare    Social: Patient resides in her home alone. Patient reports she is a widow and has had four husbands. She has a son that lives nearby and able to assist as needed. She is independent with all ADLs/IADLs. She denies any recent falls within the past year. She only occasionally uses a cane. She is able to drive herself to MD appts.  Conditions: Patient has PMH of CAD, HTN, HLD, A-fib, CKD, CVA, macular degeneration and arthritis. She was hospitalized on 06/26/17-06/12/18 for stroke and recently hospitalized on 07/13/17-07/16/17 for internal hemorrhoids and bleeding. Patient reports ongoing issues with hemorrhoids but this time things were worse than normal. She is pleased to report that symptoms are resolving. Yesterday she was experiencing some diarrhea and hypotension. She went to see MD on yesterday. Per patient office took blood for lab work and she is awaiting a phone call on the results.  She was given med(Flagyl) to start taking by Thursday if symptoms hadn't improved. Patient reports that she is feeling much better today. She finally has her appetite back and was able to eat a good breakfast this morning. She voices that she was told by MD that she needed a new BP machine as she had hers for several years which may be affecting BP readings. She replaced BP machine and states BP was in the low 100s this am. She denies being symptomatic of feeling bad. She voices she currently is not taking any BP meds. RN CM educated patient on importance monitoring BP and keeping a log. She voiced understanding.  Appointments: She saw MD at PCP office on 07/19/17 and goes back for  f/u on 07/26/17. She voices she also sees a GI MD-Dr. Lynford Humphrey. She goes yearly to see cardiologist-Dr. Selinda Flavin. She also see eye specialist and goes about every 7 wks for eye injections to manage her macular degeneration.   Advance Directives: Patient reports she has living will and HCPOA. She does not recall providing medical team a copy and patient encouraged to do so.   Encounter Medications:  Outpatient Encounter Medications as of 07/20/2017  Medication Sig Note  . simvastatin (ZOCOR) 20 MG tablet TAKE ONE TABLET BY MOUTH ONCE DAILY AT BEDTIME   . sotalol (BETAPACE) 120 MG tablet Take 120 mg by mouth 2 (two) times daily.    . metroNIDAZOLE (FLAGYL) 500 MG tablet Take 1 tablet (500 mg total) by mouth 2 (two) times daily. (Patient not taking: Reported on 07/20/2017)   . traMADol (ULTRAM) 50 MG tablet Take 1 tablet (50 mg total) by mouth every 6 (six) hours as needed. (Patient not taking: Reported on 07/20/2017) 07/14/2017: Has not picked up yet  . triamcinolone cream (KENALOG) 0.1 % APPLY  CREAM TOPICALLY THREE TIMES DAILY (Patient not taking: Reported on 07/20/2017)    No facility-administered encounter medications on file as of 07/20/2017.     Functional Status:  In your present state of health, do you have any difficulty performing the following activities: 07/14/2017 06/26/2017  Hearing? N N  Vision? N N  Difficulty concentrating or making decisions? N N  Walking or climbing stairs? N N  Dressing or bathing? N N  Doing errands, shopping? N N  Preparing Food and eating ? - -  Using the Toilet? - -  In the past six months, have you accidently leaked urine? - -  Do you have problems with loss of bowel control? - -  Managing your Medications? - -  Managing your Finances? - -  Housekeeping or managing your Housekeeping? - -  Some recent data might be hidden    Fall/Depression Screening: Fall Risk  07/20/2017 09/24/2016 09/23/2015  Falls in the past year? No Yes No  Number falls in past yr: - 1 -   Risk for fall due to : - Impaired balance/gait -  Follow up - Education provided;Falls prevention discussed -   PHQ 2/9 Scores 07/20/2017 09/24/2016 09/23/2015 09/23/2015 04/02/2014 03/06/2013  PHQ - 2 Score 0 0 0 1 0 0  PHQ- 9 Score - - 0 - - -    Assessment:  THN CM Care Plan Problem One     Most Recent Value  Care Plan Problem One  patient recently discharged from the hospital  Role Documenting the Problem One  Care Management Telephonic [place] for Problem One  Active  [facility] Long Term Goal   Patient will have no readmission to the hospital within the next 14-21 days.  THN Long Term Goal Start Date  07/20/17  Interventions for Problem One Long Term Goal  RN CM reviewed s/s of worsening condition, when and how to seek medical attention for changes in condition  THN CM Short Term Goal #1   Patient will complete all MD f/u appts within the the next 14 days.  THN CM Short Term Goal #1 Start Date  07/20/17  Interventions for Short Term Goal #1  RN CM confirmed patient has f/u appt and transportation  [facility] CM Short Term Goal #2   Patient will monitor and record BP readings daily as evidenced by being able  to review BP log via phone with RN CM within the next 14 days.  THN CM Short Term Goal #2 Start Date  07/20/17  Interventions for Short Term Goal #2  RN CM confirmed patietn has working BP machine, knows how to monitor and record BP and knows BP parameters  THN CM Short Term Goal #3  Patient will have resolution of GI symptoms as evidenced by reporting resolution of diarrhea and hemrrhoids bleeding within the next 14 days.   THN CM Short Term Goal #3 Start Date  07/20/17  Interventions for Short Tern Goal #3  RN CM review s/s of worsening condition and when to seek medical attention       Plan:  RN CM will send successful outreach letter and info to patient. RN CM will call patient within one week for follow up call. RN CM confirmed patient has Regulatory affairs officer and 24hr Nurse  [facility]. RN CM will send barriers letter to PCP and route enocunter.  Enzo Montgomery, RN,BSN,CCM [place] Management Telephonic Care Management Coordinator Direct Phone: [phone] Toll Free: [phone] Fax: [phone]

## 2017-07-20 NOTE — Discharge Summary (Signed)
[facility] Discharge Summary   Patient: Helen Wilson CHE:[id number]   PCP: Ann Held, DO DOB: Jan 26, 1932   Date of admission: 07/13/2017   Date of discharge: 07/16/2017     Discharge Diagnoses:  Active Problems:   GIB (gastrointestinal bleeding)   GI bleed   Acute lower GI bleeding   Hematochezia   Hemodynamically unstable   Rectal ulcer   Bright red blood per rectum   Admitted From: home Disposition:  home  Recommendations for Outpatient Follow-up:  1. Please follow-up with PCP in 1 week with a CBC 2. Please hold off on anticoagulation for 10 days on discharge.  Follow-up Information    Ann Held, DO. Schedule an appointment as soon as possible for a visit in 1 week(s).   Specialty:  Family Medicine Contact information: [phone] [place] [address] [phone]          Diet recommendation: Cardiac diet  Activity: The patient is advised to gradually reintroduce usual activities.  Discharge Condition: good  Code Status: DNR/DNI  History of present illness: As per the H and P dictated on admission, "84 year old female with PMH significant for but not limited to PAF on Xarelto, CAD, CKD stage III, HLD, HTN, and CVA who presented to [facility] ED with complaints of rectal bleeding.  Patient recently placed on Xarelto after recent CVA discharged on 12/16.  Additionally, patient states she had banding of internal hemorrhoids by Dr. Harlow Asa on 12/18 however I can not find any EMR of this.  Last colonoscopy over 20-30 years ago.   Reports onset of rectal bleeding "pouring out" starting around 1600 on 07/13/2016 while at rest.  Patient reports chronic constipation, but denies any current abdominal pain, nausea, vomiting, fever, chills, shortness of breath, chest pain, or syncope.   In the ER, she was initially hypotensive with SBP in the 80's.  She reports being symptomatic with dizziness once while using bedside commode, otherwise denies any  complaints. Hgb noted at 13.6.  Due to constant bright red blood with large clots, she was treated with 1L NS, Kcentra, and 1 unit of emergency released PRBC with stabilization of blood pressure.  GI consulted and patient transferred to the ICU at [facility].  PCCM to admit.   "  Hospital Course:  Summary of her active problems in the hospital is as following. Lower GI bleed. Acute blood loss anemia. Patient initially presented with hematochezia. GI was consulted underwent flexible sigmoidoscopy. Patient had bleeding from a recent hemorrhoid banding site. Stable H&H following the same. Continues to have a small amount of bleeding. GI recommends to hold antic regulation for at least 10 days on discharge.  History of essential hypertension. Echocardiogram was performed shows preserved EF. Atrial fibrillation. On anticoagulation with Xarelto. GI recommends to hold antic regulation for at least 10 days on discharge.  History of CVA. CVA 6 months ago. Status hold anticoagulation in the setting of GI bleed for at least 10 days. Follow-up with PCP in the same timeline.  All other chronic medical condition were stable during the hospitalization.  Patient was ambulatory without any assistance. On the day of the discharge the patient's vitals were stable, and no other acute medical condition were reported by patient. the patient was felt safe to be discharge at home with family.  Procedures and Results:  Flexible sigmoidoscopy.  PRBC transfusion.  Consultations:  Gastroenterology  PCCM   DISCHARGE MEDICATION: Allergies as of 07/16/2017   No Known Allergies  Medication List    STOP taking these medications   amLODipine 2.5 MG tablet Commonly known as:  NORVASC   meloxicam 15 MG tablet Commonly known as:  MOBIC   Rivaroxaban 15 MG Tabs tablet Commonly known as:  XARELTO     TAKE these medications   simvastatin 20 MG tablet Commonly known as:  ZOCOR TAKE ONE TABLET BY  MOUTH ONCE DAILY AT BEDTIME   sotalol 120 MG tablet Commonly known as:  BETAPACE Take 120 mg by mouth 2 (two) times daily.   traMADol 50 MG tablet Commonly known as:  ULTRAM Take 1 tablet (50 mg total) by mouth every 6 (six) hours as needed.   triamcinolone cream 0.1 % Commonly known as:  KENALOG APPLY  CREAM TOPICALLY THREE TIMES DAILY      No Known Allergies Discharge Instructions    AMB Referral to [place] [facility]   Complete by:  As directed    Please assign to Telephonic RNCM for transition of care due to recent readmission. Sent notification to PCP office that is listed to do toc. Written consent obtained. Currently at [facility]. Marthenia Rolling, [place], RN,BSN-[facility] [phone]   Reason for consult:  Please assign to Telephonic RNCM   Expected date of contact:  1-3 days (reserved for hospital discharges)   Call MD for:  difficulty breathing, headache or visual disturbances   Complete by:  As directed    Call MD for:  persistant dizziness or light-headedness   Complete by:  As directed    Call MD for:  persistant nausea and vomiting   Complete by:  As directed    Call MD for:  severe uncontrolled pain   Complete by:  As directed    Call MD for:  temperature >100.4   Complete by:  As directed    Diet - low sodium heart healthy   Complete by:  As directed    Discharge instructions   Complete by:  As directed    It is important that you read following instructions as well as go over your medication list with RN to help you understand your care after this hospitalization.  Discharge Instructions: Please follow-up with PCP in one week  Please request your primary care physician to go over all Hospital Tests and Procedure/Radiological results at the follow up,  Please get all Hospital records sent to your PCP by signing hospital release before you go home.   Do not drive, operating heavy machinery, perform activities at heights, swimming or  participation in water activities or provide baby sitting services; until you have been seen by Primary Care Physician or a Neurologist and advised to do so again. Do not take more than prescribed Pain, Sleep and Anxiety Medications. You were cared for by a hospitalist during your hospital stay. If you have any questions about your discharge medications or the care you received while you were in the hospital after you are discharged, you can call the unit and ask to speak with the hospitalist on call if the hospitalist that took care of you is not available.  Once you are discharged, your primary care physician will handle any further medical issues. Please note that NO REFILLS for any discharge medications will be authorized once you are discharged, as it is imperative that you return to your primary care physician (or establish a relationship with a primary care physician if you do not have one) for your aftercare needs so that they can reassess  your need for medications and monitor your lab values. You Must read complete instructions/literature along with all the possible adverse reactions/side effects for all the Medicines you take and that have been prescribed to you. Take any new Medicines after you have completely understood and accept all the possible adverse reactions/side effects. Wear Seat belts while driving. If you have smoked or chewed Tobacco in the last 2 yrs please stop smoking and/or stop any Recreational drug use.   Increase activity slowly   Complete by:  As directed      Discharge Exam: Filed Weights   07/14/17 0403 07/15/17 0700 07/16/17 0449  Weight: 89.6 kg (197 lb 8.5 oz) 89.6 kg (197 lb 8.5 oz) 91.7 kg (202 lb 2.6 oz)   Vitals:   07/16/17 1202 07/16/17 1433  BP: 137/61 140/60  Pulse: 72 79  Resp: 18   Temp: 98.1 F (36.7 C)   SpO2: 98% 96%   General: Appear in no distress, no Rash; Oral Mucosa moist. Cardiovascular: S1 and S2 Present, no Murmur, no  JVD Respiratory: Bilateral Air entry present and Clear to Auscultation, no Crackles, no wheezes Abdomen: Bowel Sound present, Soft and no tenderness Extremities: no Pedal edema, no calf tenderness Neurology: Grossly no focal neuro deficit.  The results of significant diagnostics from this hospitalization (including imaging, microbiology, ancillary and laboratory) are listed below for reference.    Significant Diagnostic Studies: Ct Head Wo Contrast  Result Date: 06/26/2017 CLINICAL DATA:  Numbness or tingling and paresthesias. Left arm tingling. Left facial droop. EXAM: CT HEAD WITHOUT CONTRAST TECHNIQUE: Contiguous axial images were obtained from the base of the skull through the vertex without intravenous contrast. COMPARISON:  None. FINDINGS: Brain: Possible punctate lacunar infarcts in the anterior limb of the right basal ganglia, age indeterminate. Age-related cerebral atrophy. No intracranial hemorrhage, mass effect, or midline shift. No hydrocephalus. The basilar cisterns are patent. No extra-axial or intracranial fluid collection. Vascular: Atherosclerosis of skullbase vasculature without hyperdense vessel or abnormal calcification. Skull: No fracture or focal lesion. Sinuses/Orbits: Scattered opacification of ethmoid air cells. No sinus fluid level. Mastoid air cells are clear. Other: None. IMPRESSION: Possible subcentimeter age-indeterminate lacunar infarcts in the right basal ganglia. No hemorrhage or large vessel ischemia. Electronically Signed   By: Jeb Levering M.D.   On: 06/26/2017 03:33   Mr Brain Wo Contrast  Result Date: 06/26/2017 CLINICAL DATA:  84 year old female with left facial droop, left arm tingling. Possible right basal ganglia lacunar infarct on noncontrast head CT earlier today. EXAM: MRI HEAD WITHOUT CONTRAST MRA HEAD WITHOUT CONTRAST TECHNIQUE: Multiplanar, multiecho pulse sequences of the brain and surrounding structures were obtained without intravenous contrast.  Angiographic images of the head were obtained using MRA technique without contrast. COMPARISON:  Noncontrast head CT 0246 hours. FINDINGS: MRI HEAD FINDINGS Brain: Small 7 mm oval area of restricted diffusion in the central right thalamus (series 3, image 25). No associated hemorrhage, T2 FLAIR hyperintensity or mass effect. No other convincing restricted diffusion. No midline shift, mass effect, evidence of mass lesion, ventriculomegaly, extra-axial collection or acute intracranial hemorrhage. Cervicomedullary junction and pituitary are within normal limits. Mild T2 heterogeneity elsewhere in the deep gray matter - including the right basal ganglia - nuclei more resembles small perivascular spaces (normal variant). Mild for age scattered cerebral white matter T2 and FLAIR hyperintensity. No cortical encephalomalacia or chronic cerebral blood products. Negative brainstem and cerebellum. Vascular: Major intracranial vascular flow voids are preserved. Generalized intracranial artery tortuosity. Skull and upper cervical spine: Negative  visualized cervical spine. Visualized bone marrow signal is within normal limits. Sinuses/Orbits: Postoperative changes to both globes. Otherwise normal orbits soft tissues. Paranasal sinuses and mastoids are stable and well pneumatized. Other: Visible internal auditory structures appear normal. Scalp and face soft tissues appear negative. MRA HEAD FINDINGS Antegrade flow in the posterior circulation tortuous distal vertebral arteries without stenosis. Right PICA is visible and patent. Patent basilar artery with tortuosity but no stenosis. SCA and PCA origins are normal. The right posterior communicating artery is present, the left might be diminutive or absent. Right PCA P1 segment an bilateral PCA branches are normal. Antegrade flow in both ICA siphons. No siphon stenosis. Normal ophthalmic and posterior communicating artery origins. Patent carotid termini. Normal MCA and ACA origins.  Diminutive anterior communicating artery. Visible ACA branches are within normal limits. MCA M1 segments, MCA bifurcations and visible bilateral MCA branches are within normal limits. IMPRESSION: 1. Small acute lacunar infarct in the central right thalamus. No associated hemorrhage or mass effect. 2. No other acute intracranial abnormality, and otherwise mild for age nonspecific chronic signal changes in the brain. 3. Intracranial artery tortuosity but otherwise negative intracranial MRA. Electronically Signed   By: Genevie Ann M.D.   On: 06/26/2017 12:48   Nm Gi Blood Loss  Result Date: 07/14/2017 CLINICAL DATA:  Lower GI bleeding. EXAM: NUCLEAR MEDICINE GASTROINTESTINAL BLEEDING SCAN TECHNIQUE: Sequential abdominal images were obtained following intravenous administration of Tc-26m labeled red blood cells. RADIOPHARMACEUTICALS:  28.9 mCi Tc-33m in-vitro labeled red cells. COMPARISON:  None. FINDINGS: Physiologic distribution of radiopharmaceutical activity is seen. No abnormal sites of radiopharmaceutical activity seen within the abdomen or pelvis. IMPRESSION: Negative.  No evidence of active GI bleeding. Electronically Signed   By: Earle Gell M.D.   On: 07/14/2017 12:02   Mr Jodene Nam Head Wo Contrast  Result Date: 06/26/2017 CLINICAL DATA:  84 year old female with left facial droop, left arm tingling. Possible right basal ganglia lacunar infarct on noncontrast head CT earlier today. EXAM: MRI HEAD WITHOUT CONTRAST MRA HEAD WITHOUT CONTRAST TECHNIQUE: Multiplanar, multiecho pulse sequences of the brain and surrounding structures were obtained without intravenous contrast. Angiographic images of the head were obtained using MRA technique without contrast. COMPARISON:  Noncontrast head CT 0246 hours. FINDINGS: MRI HEAD FINDINGS Brain: Small 7 mm oval area of restricted diffusion in the central right thalamus (series 3, image 25). No associated hemorrhage, T2 FLAIR hyperintensity or mass effect. No other convincing  restricted diffusion. No midline shift, mass effect, evidence of mass lesion, ventriculomegaly, extra-axial collection or acute intracranial hemorrhage. Cervicomedullary junction and pituitary are within normal limits. Mild T2 heterogeneity elsewhere in the deep gray matter - including the right basal ganglia - nuclei more resembles small perivascular spaces (normal variant). Mild for age scattered cerebral white matter T2 and FLAIR hyperintensity. No cortical encephalomalacia or chronic cerebral blood products. Negative brainstem and cerebellum. Vascular: Major intracranial vascular flow voids are preserved. Generalized intracranial artery tortuosity. Skull and upper cervical spine: Negative visualized cervical spine. Visualized bone marrow signal is within normal limits. Sinuses/Orbits: Postoperative changes to both globes. Otherwise normal orbits soft tissues. Paranasal sinuses and mastoids are stable and well pneumatized. Other: Visible internal auditory structures appear normal. Scalp and face soft tissues appear negative. MRA HEAD FINDINGS Antegrade flow in the posterior circulation tortuous distal vertebral arteries without stenosis. Right PICA is visible and patent. Patent basilar artery with tortuosity but no stenosis. SCA and PCA origins are normal. The right posterior communicating artery is present, the left might be  diminutive or absent. Right PCA P1 segment an bilateral PCA branches are normal. Antegrade flow in both ICA siphons. No siphon stenosis. Normal ophthalmic and posterior communicating artery origins. Patent carotid termini. Normal MCA and ACA origins. Diminutive anterior communicating artery. Visible ACA branches are within normal limits. MCA M1 segments, MCA bifurcations and visible bilateral MCA branches are within normal limits. IMPRESSION: 1. Small acute lacunar infarct in the central right thalamus. No associated hemorrhage or mass effect. 2. No other acute intracranial abnormality, and  otherwise mild for age nonspecific chronic signal changes in the brain. 3. Intracranial artery tortuosity but otherwise negative intracranial MRA. Electronically Signed   By: Genevie Ann M.D.   On: 06/26/2017 12:48    Microbiology: Recent Results (from the past 240 hour(s))  MRSA PCR Screening     Status: None   Collection Time: 07/14/17  3:50 AM  Result Value Ref Range Status   MRSA by PCR NEGATIVE NEGATIVE Final    Comment:        The GeneXpert MRSA Assay (FDA approved for NASAL specimens only), is one component of a comprehensive MRSA colonization surveillance program. It is not intended to diagnose MRSA infection nor to guide or monitor treatment for MRSA infections.      Labs: CBC: Recent Labs  Lab 07/14/17 1112 07/14/17 1600 07/15/17 0257 07/16/17 0605 07/19/17 1408  WBC  --   --  6.1 5.9 7.1  NEUTROABS  --   --   --  2.7 4.2  HGB 10.3* 9.5* 9.8* 9.5* 10.6*  HCT 30.7* 29.3* 29.2* 29.2* 32.4*  MCV  --   --  92.7 91.8 96.2  PLT  --   --  122* 142* 174.0   Basic Metabolic Panel: Recent Labs  Lab 07/15/17 0257 07/19/17 1408  NA 141 143  K 3.8 4.2  CL 116* 111  CO2 21* 26  GLUCOSE 97 118*  BUN 20 29*  CREATININE 1.21* 1.50*  CALCIUM 8.1* 9.1  PHOS 3.1  --    Liver Function Tests: Recent Labs  Lab 07/15/17 0257 07/19/17 1408  AST  --  23  ALT  --  17  ALKPHOS  --  65  BILITOT  --  0.6  PROT  --  6.1  ALBUMIN 2.8* 3.7   No results for input(s): LIPASE, AMYLASE in the last 168 hours. No results for input(s): AMMONIA in the last 168 hours. Cardiac Enzymes: Recent Labs  Lab 07/16/17 1440  CKTOTAL 43  CKMB 1.0  TROPONINI <0.03   BNP (last 3 results) No results for input(s): BNP in the last 8760 hours. CBG: No results for input(s): GLUCAP in the last 168 hours. Time spent: 35 minutes  Signed:  Berle Mull  [facility] 07/16/2017, 11:50 PM

## 2017-07-22 ENCOUNTER — Other Ambulatory Visit: Payer: Self-pay

## 2017-07-22 ENCOUNTER — Telehealth: Payer: Self-pay

## 2017-07-22 NOTE — Patient Outreach (Signed)
[facility]) Care Management  07/22/2017  Helen Wilson 10/23/1931 [id number]     Difficult Case Discussion Date of Review: 07/22/17 Reason: Readmission within 30 days PCP: Dr. Roma Schanz Insurance: [employer]    Medical Info: 84 yr old female with patient PMH of CAD, HTN, HLD, A-fib, CKD stage 3, CVA, macular degeneration, arthritis and hemorrhoids.   Admissions: Patient presented to the hospital on 06/26/17 for left sided weakness and numbness-admitted for stroke(with no residual effects). She was sent home to start taking Xarelto. Patient readmitted to the hospital on 07/13/17 for hypotension and bright red blood in rectum. She had recently underwent "banding of internal hemorrhoids" on 06/29/17. While hospitalized she continued to have bleeding, became anemic and required blood transfusion. GI consulted and she underwent flex sigmoidoscopy. Patient discharged on 07/16/17 to home.   Disposition: Patient lives alone. She is independent with ADLs/IADLs-has a son that lives nearby and able to assist as needed. She was instructed to hold her anti-coagulation med for at least 10 days post discharge and until seen by MD for f/u appt.  RN CM Follow Up: RN CM will continue to follow patient for transition of care.   Enzo Montgomery, RN,BSN,CCM [place] [facility] Telephonic Care Management Coordinator Direct Phone: [phone] Toll Free: [phone] Fax: [phone]

## 2017-07-26 ENCOUNTER — Encounter: Payer: Self-pay | Admitting: Medical

## 2017-07-26 ENCOUNTER — Ambulatory Visit (INDEPENDENT_AMBULATORY_CARE_PROVIDER_SITE_OTHER): Payer: Medicare Other | Admitting: Medical

## 2017-07-26 VITALS — BP 127/48 | HR 61 | Temp 97.9°F | Resp 16 | Ht 63.0 in | Wt 199.4 lb

## 2017-07-26 DIAGNOSIS — K2971 Gastritis, unspecified, with bleeding: Secondary | ICD-10-CM | POA: Diagnosis not present

## 2017-07-26 DIAGNOSIS — D649 Anemia, unspecified: Secondary | ICD-10-CM

## 2017-07-26 DIAGNOSIS — Z8673 Personal history of transient ischemic attack (TIA), and cerebral infarction without residual deficits: Secondary | ICD-10-CM | POA: Diagnosis not present

## 2017-07-26 DIAGNOSIS — N183 Chronic kidney disease, stage 3 unspecified: Secondary | ICD-10-CM

## 2017-07-26 NOTE — Progress Notes (Signed)
Subjective:    Patient ID: Helen Wilson, female    DOB: [birth year], 84 y.o.   MRN: [id number]  HPI  Pt states she is feeling good overall. Good energy.  Pt bp readings look overall that her bp have been normalizing. Last 3 days her bp medications have been better. Pt last bp readings 135/61, 124/62, 115/62, 115/52,109/53, 125/65, 141/65, 123/52, 141/68.  Pt had been on bp meds formerly. But since anemia event and hospitalization stopped the amlodipine 2.5 mg a day.  Pt has been hydrating with propel. Her GFR was on low side last visit. Her diarrhea/loose stools resolved day she came in. She never turned in stool studies.   Pt has been on meloxicam in the past. Pt states need something for the pain. She has been off meloxicam in light of recent GI history and her kidney function.  Pt has upcoming appointment with Eye MD for her macular degeneration.  Pt anemia on repeat testing hb/hct had been improved.      Review of Systems  Constitutional: Negative for chills, fatigue and fever.  Respiratory: Negative for chest tightness, shortness of breath and wheezing.   Cardiovascular: Negative for chest pain and palpitations.  Gastrointestinal: Negative for abdominal distention, abdominal pain, blood in stool, diarrhea and vomiting.  Genitourinary: Negative for decreased urine volume, difficulty urinating, dyspareunia, flank pain, frequency, pelvic pain and vaginal discharge.  Musculoskeletal: Negative for back pain, joint swelling and neck stiffness.  Skin: Negative for rash.  Neurological: Negative for dizziness, weakness, light-headedness and headaches.  Hematological: Negative for adenopathy. Does not bruise/bleed easily.  Psychiatric/Behavioral: Negative for behavioral problems, confusion and sleep disturbance. The patient is not nervous/anxious and is not hyperactive.     Past Medical History:  Diagnosis Date  . Arthritis   . Atrial fibrillation ([place])   . Coronary artery disease    . Heart murmur   . Hemorrhoids   . Hyperlipidemia   . Hypertension   . Macular degeneration   . Osteoporosis   . Stroke [facility])      Social History   Socioeconomic History  . Marital status: Married    Spouse name: Not on file  . Number of children: Not on file  . Years of education: Not on file  . Highest education level: Not on file  Social Needs  . Financial resource strain: Not on file  . Food insecurity - worry: Not on file  . Food insecurity - inability: Not on file  . Transportation needs - medical: Not on file  . Transportation needs - non-medical: Not on file  Occupational History  . Not on file  Tobacco Use  . Smoking status: Never Smoker  . Smokeless tobacco: Never Used  Substance and Sexual Activity  . Alcohol use: Yes    Comment: socially  . Drug use: No  . Sexual activity: No  Other Topics Concern  . Not on file  Social History Narrative  . Not on file    Past Surgical History:  Procedure Laterality Date  . ABDOMINAL HYSTERECTOMY    . BREAST BIOPSY    . FLEXIBLE SIGMOIDOSCOPY N/A 07/14/2017   Procedure: FLEXIBLE SIGMOIDOSCOPY;  Surgeon: Jerene Bears, MD;  Location: [facility] ENDOSCOPY;  Service: Gastroenterology;  Laterality: N/A;  . KNEE ARTHROSCOPY    . LEEP      Family History  Problem Relation Age of Onset  . Parkinsonism Mother   . Rheum arthritis Mother   . Stroke Father   . Arthritis Unknown   .  Heart disease Unknown   . Diabetes Unknown     No Known Allergies  Current Outpatient Medications on File Prior to Visit  Medication Sig Dispense Refill  . metroNIDAZOLE (FLAGYL) 500 MG tablet Take 1 tablet (500 mg total) by mouth 2 (two) times daily. (Patient not taking: Reported on 07/20/2017) 14 tablet 0  . simvastatin (ZOCOR) 20 MG tablet TAKE ONE TABLET BY MOUTH ONCE DAILY AT BEDTIME 90 tablet 3  . sotalol (BETAPACE) 120 MG tablet Take 120 mg by mouth 2 (two) times daily.   1  . traMADol (ULTRAM) 50 MG tablet Take 1 tablet (50 mg total) by  mouth every 6 (six) hours as needed. (Patient not taking: Reported on 07/20/2017) 30 tablet 1  . triamcinolone cream (KENALOG) 0.1 % APPLY  CREAM TOPICALLY THREE TIMES DAILY (Patient not taking: Reported on 07/20/2017) 80 g 5   No current facility-administered medications on file prior to visit.     BP (!) 127/48   Pulse 61   Temp 97.9 F (36.6 C) (Oral)   Resp 16   Ht 5\' 3"  (1.6 m)   Wt 199 lb 6.4 oz (90.4 kg)   SpO2 99%   BMI 35.32 kg/m       Objective:   Physical Exam  General Mental Status- Alert. General Appearance- Not in acute distress.   Skin General: Color- Normal Color. Moisture- Normal Moisture.  Neck Carotid Arteries- Normal color. Moisture- Normal Moisture. No carotid bruits. No JVD.  Chest and Lung Exam Auscultation: Breath Sounds:-Normal.  Cardiovascular Auscultation:Rythm- Regular. Murmurs & Other Heart Sounds:Auscultation of the heart reveals- No Murmurs.  Abdomen Inspection:-Inspeection Normal. Palpation/Percussion:Note:No mass. Palpation and Percussion of the abdomen reveal- Non Tender, Non Distended + BS, no rebound or guarding.   Neurologic Cranial Nerve exam:- CN III-XII intact(No nystagmus), symmetric smile. Drift Test:- No drift. Finger to Nose:- Normal/Intact Strength:- 5/5 equal and symmetric strength both upper and lower extremities.      Assessment & Plan:  Your blood volume/anemia appears improved on most recent labs.  I do want to repeat your blood work today and see if it continues to improve.  Recent kidney function was decreased and you do have a history of renal insufficiency.  I want to repeat the lab today to see if you had recent dehydration component to the decreased cr and GFR.  After reviewing labs/kidney function studies, will send Dr. Etter Sjogren a message and see if she wants to continue the low-dose meloxicam or may be try low-dose ibuprofen.(On further review it appears that Dr. Etter Sjogren had mentioned to patient stopping  meloxicam but patient wanted to continue with the Mobic.  Dr. Etter Sjogren gave her a prescription of tramadol but patient states that she never filled it)  Keep checking your blood pressure daily.  Your blood pressures have increased recently over the past 3 days.  If your systolic number is consistently close to 140 then would add back the low-dose amlodipine 2.5 mg.  I did send message to staff to check on your neurology referral. I would go ahead and keep the ophthalmologist appointment.  Follow-up date to be determined after lab review.  Lillianna Sabel, Percell Miller, PA-C

## 2017-07-26 NOTE — Patient Instructions (Addendum)
Your blood volume/anemia appears improved on most recent labs.  I do want to repeat your blood work today and see if it continues to improve.  Recent kidney function was decreased and you do have a history of renal insufficiency.  I want to repeat the lab today to see if you had recent dehydration component to the decreased cr and GFR.  After reviewing labs/kidney function studies, will send Dr. Etter Sjogren a message and see if she wants to continue the low-dose meloxicam or may be try low-dose ibuprofen.(On further review it appears that Dr. Etter Sjogren had mentioned to patient stopping meloxicam but patient wanted to continue with the Mobic.  Dr. Etter Sjogren gave her a prescription of tramadol but patient states that she never filled it)  Keep checking your blood pressure daily.  Your blood pressures have increased recently over the past 3 days.  If your systolic number is consistently close to 140 then would add back the low-dose amlodipine 2.5 mg.  I did send message to staff to check on your neurology referral. I would go ahead and keep the ophthalmologist appointment.  Follow-up date to be determined after lab review.

## 2017-07-27 ENCOUNTER — Other Ambulatory Visit: Payer: Self-pay

## 2017-07-27 LAB — COMPREHENSIVE METABOLIC PANEL
ALT: 15 U/L (ref 0–35)
AST: 19 U/L (ref 0–37)
Albumin: 3.8 g/dL (ref 3.5–5.2)
Alkaline Phosphatase: 67 U/L (ref 39–117)
BUN: 26 mg/dL — ABNORMAL HIGH (ref 6–23)
CO2: 28 mEq/L (ref 19–32)
Calcium: 9.2 mg/dL (ref 8.4–10.5)
Chloride: 109 mEq/L (ref 96–112)
Creatinine, Ser: 1.33 mg/dL — ABNORMAL HIGH (ref 0.40–1.20)
GFR: 40.46 mL/min — ABNORMAL LOW (ref >60.00)
Glucose, Bld: 143 mg/dL — ABNORMAL HIGH (ref 70–99)
Potassium: 3.9 mEq/L (ref 3.5–5.1)
Sodium: 143 mEq/L (ref 135–145)
Total Bilirubin: 0.6 mg/dL (ref 0.2–1.2)
Total Protein: 6.4 g/dL (ref 6.0–8.3)

## 2017-07-27 LAB — CBC WITH DIFFERENTIAL/PLATELET
Basophils Absolute: 0.1 10*3/uL (ref 0.0–0.1)
Basophils Relative: 1 % (ref 0.0–3.0)
Eosinophils Absolute: 0.3 10*3/uL (ref 0.0–0.7)
Eosinophils Relative: 4.9 % (ref 0.0–5.0)
HCT: 34.8 % — ABNORMAL LOW (ref 36.0–46.0)
Hemoglobin: 11.3 g/dL — ABNORMAL LOW (ref 12.0–15.0)
Lymphocytes Relative: 27.6 % (ref 12.0–46.0)
Lymphs Abs: 1.7 10*3/uL (ref 0.7–4.0)
MCHC: 32.6 g/dL (ref 30.0–36.0)
MCV: 97.1 fl (ref 78.0–100.0)
Monocytes Absolute: 0.5 10*3/uL (ref 0.1–1.0)
Monocytes Relative: 8.3 % (ref 3.0–12.0)
Neutro Abs: 3.6 10*3/uL (ref 1.4–7.7)
Neutrophils Relative %: 58.2 % (ref 43.0–77.0)
Platelets: 210 10*3/uL (ref 150.0–400.0)
RBC: 3.58 Mil/uL — ABNORMAL LOW (ref 3.87–5.11)
RDW: 14.9 % (ref 11.5–15.5)
WBC: 6.2 10*3/uL (ref 4.0–10.5)

## 2017-07-27 NOTE — Patient Outreach (Signed)
[facility]) Care Management  07/27/2017  Helen Wilson 1932-05-01 [id number]     Transition of Care Week #2    Outreach attempt #2 to patient. No answer at present. RN CM left HIPAA compliant voicemail message along with contact info.      Plan: RN CM will make outreach attempt to patient within a week if no return call.   Enzo Montgomery, RN,BSN,CCM [place] Management Telephonic Care Management Coordinator Direct Phone: [phone] Toll Free: [phone] Fax: [phone]

## 2017-07-28 ENCOUNTER — Other Ambulatory Visit: Payer: Self-pay

## 2017-07-28 ENCOUNTER — Telehealth: Payer: Self-pay

## 2017-07-28 NOTE — Telephone Encounter (Signed)
Talked to patient about results. She is hesitant to take the Tramadol, Advised pt. To try medication when she is with company.  She asked if she needs to go back on her BP medication (has been off med for 1 month), she reports her bp readings at home have bee 116/54, 123/52.

## 2017-07-28 NOTE — Patient Outreach (Signed)
[facility]) Care Management  07/28/2017  Helen Wilson 11-Dec-1931 [id number]   Transition of Care    Voicemail message received from patient. Return call placed to patient. Patient voices she is doing fairly well. She continues to have "old people arthritis." No pain noted during this call. She had PCP f/u appt on 07/26/17. She voices PCP does not want her taking anything other than Ultram for pain. Patient fearful of taking med due to her age, living alone and all the noted side effects. RN CM discussed possible nonpharmacologic measures that may assist patient. She is pleased to report that her diarrhea and GI symptoms have completely resolved. She is eating well and has a good appetite. Patient continues to monitor and track and monitor BP. Readings have been WNL and reported at ranging between 124-580D systolic. She took her BP reading log to MD appt as advised by RN CM and shared recordings with MD. She states that she moved her neuro appt up to Feb. She voices that her son went with her to her PCP f/u appt. She is pleased to report that she has noticed that son is starting to be there for her more and supportive. She voices no RN CM needs or concerns. Patient advised to call RN CM for any needs or concerns.      Plan: RN CM will make outreach attempt to patient within a week.   Enzo Montgomery, RN,BSN,CCM [place] Management Telephonic Care Management Coordinator Direct Phone: [phone] Toll Free: [phone] Fax: [phone]

## 2017-07-28 NOTE — Telephone Encounter (Signed)
-----   Message from Mackie Pai, PA-C sent at 07/27/2017  6:05 PM EST ----- Some renal insufficiency as before.  But it does look improved from the last visit.  So would  suspect that there was some degree of dehydration at the time you were seen 1 week ago.  Also your blood volume hemoglobin/hematocrit continues to improve.  Hemoglobin has gone up almost one-point since the last visit.  Also on the day that you left I was looking at prior records and it does appear that Dr. Etter Sjogren had wrote you for tramadol.  I saw in the note she made some comments about your kidney function and it sounded like she was trying to avoid prescribing meloxicam but that you had indicated that you felt that it was necessary.  So I would reemphasize that you try tramadol.  I think it would be safer for your kidneys.  I would recommend that you try the tablet and see if you have any adverse side effects.  Keep in mind this is a weaker narcotic.  When you first take the tablet make sure your son is around.  Most people do not have any side effects but some small percentage report sedation. When you call try to talk with her son. That might be helpful in her understanding fully.

## 2017-07-30 ENCOUNTER — Telehealth: Payer: Self-pay | Admitting: Medical

## 2017-07-30 ENCOUNTER — Ambulatory Visit: Payer: Self-pay

## 2017-07-30 MED ORDER — MELOXICAM 7.5 MG PO TABS: 7.5000 mg | ORAL_TABLET | Freq: Every day | ORAL | 0 refills | Status: DC

## 2017-07-30 NOTE — Telephone Encounter (Signed)
I sent you a message on this patient.  I prescribed meloxicam(rx to her pharmacy) Would you review the note I sent you and notify patient that meloxicam was sent in.

## 2017-07-30 NOTE — Telephone Encounter (Signed)
Prescription of meloxicam sent to patient's pharmacy.  Note also tried to find a number of her son in the chart to discuss why we would want her to try tramadol instead.  But could not find his number.

## 2017-07-30 NOTE — Telephone Encounter (Signed)
I saw CRM after hours about patient not getting adequate pain control with Tylenol.  She also tried ibuprofen and that did not help.  I had advised her that we might try low-dose ibuprofen in the future.  But after reviewing her PCPs last note I trie to encourage her to use the tramadol.  Patient did not want  the tramadol.  She wants be on meloxicam.  It appears Dr. Etter Sjogren wrote the meloxicam at lower dose and advised patient regarding meloxicam effect on kidney function.  I am going to send in prescription of meloxicam.  Note because I saw this at 6:15 after hours was not able to call patient.

## 2017-07-30 NOTE — Telephone Encounter (Signed)
Copied from [place] [id number]. Topic: General - Other >> Jul 30, 2017  8:23 AM Marin Olp L wrote: Reason for CRM: Patient says she was taken off of meloxicam and she's still having a lot of pain. She was told to take tylenol or ibuprofen for pain but she doesn't feel like that helping her. She wants to discuss her kidney health in the past 3 years and compare it to her present results in relation to her medications and what she can take. Would like a call back from [place] or cma.

## 2017-08-03 NOTE — Telephone Encounter (Signed)
Would try to have her her PCP.  Since it sounds like on chart review that his PCP wanted her to eventually try tramadol but patient does not want to try tramadol.  In addition patient has some renal insufficiency.  Would want PCP to make the decision on further refills of meloxicam going forward.

## 2017-08-03 NOTE — Telephone Encounter (Signed)
Pt notified. Pt wanted to know if she needs an appointment when meds run out.

## 2017-08-04 ENCOUNTER — Other Ambulatory Visit: Payer: Self-pay

## 2017-08-04 NOTE — Patient Outreach (Signed)
[facility]) Care Management  08/04/2017  Helen Wilson 1932/05/06 [id number]   Transition Of Care   Outreach attempt # to patient. Recording stating "due to network difficulties call can not be completed at this time." No alternate numbers to attempt.        Plan: RN CM will make outreach attempt to patient within two business days.   Enzo Montgomery, RN,BSN,CCM [place] Management Telephonic Care Management Coordinator Direct Phone: [phone] Toll Free: [phone] Fax: [phone]

## 2017-08-04 NOTE — Telephone Encounter (Signed)
Patient advised and will follow up with pcp

## 2017-08-04 NOTE — Telephone Encounter (Signed)
Left message on machine to call back  

## 2017-08-05 ENCOUNTER — Other Ambulatory Visit: Payer: Self-pay

## 2017-08-05 DIAGNOSIS — H353111 Nonexudative age-related macular degeneration, right eye, early dry stage: Secondary | ICD-10-CM | POA: Diagnosis not present

## 2017-08-05 DIAGNOSIS — H353221 Exudative age-related macular degeneration, left eye, with active choroidal neovascularization: Secondary | ICD-10-CM | POA: Diagnosis not present

## 2017-08-05 DIAGNOSIS — H43812 Vitreous degeneration, left eye: Secondary | ICD-10-CM | POA: Diagnosis not present

## 2017-08-05 NOTE — Patient Outreach (Signed)
[facility]) Care Management  08/05/2017  Helen Wilson 1931/11/24 [id number]   Transition of Care #3     Outreach attempt #2 to patient. Spoke with patient. She is doing well and headed out the door in a few minutes for eye appt. She voices that things have been going well. She is pleased to report that she was able to convince MD to put her back on her regular arthritis med. She started back this week and voices how much better she feels and "can function without pain." Patient states she was only given a 30 day supply of med not her usual 90 day supply. She voices MD is concerned about her kidney function and taking the med long term. She reports she will have to have lab work done to check her kidney function before she can proceed to continue to take med. Otherwise, patient voices that her BP has been doing well. She continues to monitor and track readings. No GI issues. Patient voices no needs or concerns at this time. RN CM discussed with patient that she is doing well and progressing since return home and will be discharged from [facility] program next week if no further issues. She voiced understanding and appreciation of weekly calls. She has RN CM and [facility] contact info for any needs or concerns.     Plan: RN CM will make outreach attempt to patient within one week.    Enzo Montgomery, RN,BSN,CCM [place] Management Telephonic Care Management Coordinator Direct Phone: [phone] Toll Free: [phone] Fax: [phone]

## 2017-08-12 ENCOUNTER — Other Ambulatory Visit: Payer: Self-pay

## 2017-08-12 ENCOUNTER — Telehealth: Payer: Self-pay | Admitting: Family Medicine

## 2017-08-12 NOTE — Telephone Encounter (Signed)
Copied from [place]. Topic: General - Other >> Aug 12, 2017 10:30 AM Yvette Rack wrote: Reason for CRM: patient calling stating that since she has been taking the Meloxicam he blood pressure has been going up since last Friday 116/41 132/53 140/64 she wants to know if she should go back on her blood pressure medicine Amlodipine

## 2017-08-12 NOTE — Patient Outreach (Signed)
[facility]) Care Management  08/12/2017  Marycarmen Hagey 1932-03-28 [id number]    Transition of Care Case Closure    Outreach attempt to patient. Spoke with patient. She continues to voice that overall she is doing fairly well. Pain remains managed and controlled with current therapy. She is unsure if she will have to been seen and/or have labs dawn before she gets refill on script. She will call PCP office to follow up. Patient has neuro appt in Feb. She did not like the fact that neuro waits so long to see patients after a stroke. RN CM explained to rationale of this. She continues to have some numbness and burning on the side of face but voices it has not gotten any worse. She continues to track BP daily. She reports that her BP has returned back to her baseline 130s-140s. She had been previously for the past few weeks in the low 355'H systolic. Patient voices that this BP was "too low" for her and made her feel "funny." Per MD notes on 07/26/17 plan was to possibly restart patient back on Norvasc if BP was consistently running in the 140's. Patient will call PCP office to follow up and clarify if it is time to resume her BP med. Otherwise, patient has no further issues or concerns at this time. She has completed short term TOC program and goals have been met. She is aware that she may contact [facility] at any time in the future if needs or concerns arise. Patient voices understanding and appreciation of weekly TOC calls that have been made to her.      Plan: RN CM will notify [facility] administrative assistant of case closure. RN CM will send MD case closure letter.    Enzo Montgomery, RN,BSN,CCM [place] Management Telephonic Care Management Coordinator Direct Phone: [phone] Toll Free: [phone] Fax: [phone]

## 2017-08-31 DIAGNOSIS — I482 Chronic atrial fibrillation: Secondary | ICD-10-CM | POA: Diagnosis not present

## 2017-08-31 DIAGNOSIS — I6381 Other cerebral infarction due to occlusion or stenosis of small artery: Secondary | ICD-10-CM | POA: Diagnosis not present

## 2017-09-23 DIAGNOSIS — H353221 Exudative age-related macular degeneration, left eye, with active choroidal neovascularization: Secondary | ICD-10-CM | POA: Diagnosis not present

## 2017-10-19 DIAGNOSIS — I517 Cardiomegaly: Secondary | ICD-10-CM | POA: Diagnosis not present

## 2017-10-19 DIAGNOSIS — E7800 Pure hypercholesterolemia, unspecified: Secondary | ICD-10-CM | POA: Diagnosis not present

## 2017-10-19 DIAGNOSIS — I700 Atherosclerosis of aorta: Secondary | ICD-10-CM | POA: Diagnosis not present

## 2017-10-19 DIAGNOSIS — R001 Bradycardia, unspecified: Secondary | ICD-10-CM | POA: Diagnosis not present

## 2017-10-19 DIAGNOSIS — I480 Paroxysmal atrial fibrillation: Secondary | ICD-10-CM | POA: Diagnosis not present

## 2017-10-19 DIAGNOSIS — I10 Essential (primary) hypertension: Secondary | ICD-10-CM | POA: Diagnosis not present

## 2017-10-21 ENCOUNTER — Other Ambulatory Visit: Payer: Self-pay

## 2017-10-21 ENCOUNTER — Telehealth: Payer: Self-pay | Admitting: Family Medicine

## 2017-10-21 DIAGNOSIS — E785 Hyperlipidemia, unspecified: Secondary | ICD-10-CM

## 2017-10-21 MED ORDER — SIMVASTATIN 20 MG PO TABS: 20.0000 mg | ORAL_TABLET | Freq: Every day | ORAL | 0 refills | Status: DC

## 2017-10-21 MED ORDER — MELOXICAM 7.5 MG PO TABS: 7.5000 mg | ORAL_TABLET | Freq: Every day | ORAL | 0 refills | Status: DC

## 2017-10-21 NOTE — Telephone Encounter (Signed)
Copied from [place]. Topic: Quick Communication - Rx Refill/Question >> Oct 21, 2017 11:17 AM Oliver Pila B wrote: Medication: meloxicam (MOBIC) 7.5 MG tablet [[id number]]  90 day supply simvastatin (ZOCOR) 20 MG tablet [[id number]] 90 day supply Has the patient contacted their pharmacy? Yes.   (Agent: If no, request that the patient contact the pharmacy for the refill.) Preferred Pharmacy (with phone number or street name): [facility] Agent: Please be advised that RX refills may take up to 3 business days. We ask that you follow-up with your pharmacy.

## 2017-11-04 ENCOUNTER — Other Ambulatory Visit: Payer: Self-pay | Admitting: Family Medicine

## 2017-11-04 DIAGNOSIS — R21 Rash and other nonspecific skin eruption: Secondary | ICD-10-CM

## 2017-11-11 DIAGNOSIS — H353221 Exudative age-related macular degeneration, left eye, with active choroidal neovascularization: Secondary | ICD-10-CM | POA: Diagnosis not present

## 2017-12-29 DIAGNOSIS — H353222 Exudative age-related macular degeneration, left eye, with inactive choroidal neovascularization: Secondary | ICD-10-CM | POA: Diagnosis not present

## 2018-01-19 IMAGING — MR MR MRA HEAD W/O CM
9 of 11 series · 32 of 48 positions shown · non-contrast
Comparison: Noncontrast head CT 8027 hours.

CLINICAL DATA: 85-year-old female with left facial droop, left arm
tingling. Possible right basal ganglia lacunar infarct on
noncontrast head CT earlier today.

EXAM:
MRI HEAD WITHOUT CONTRAST
MRA HEAD WITHOUT CONTRAST
TECHNIQUE: Multiplanar, multiecho pulse sequences of the brain and surrounding
structures were obtained without intravenous contrast. Angiographic
images of the head were obtained using MRA technique without
contrast.

[Series 3: DWI · axial · 3.0mm · 1.09mm/px · z∈[-71,+72]mm · 7 of 98 slices shown (1 of 4)]
[im 1/98]
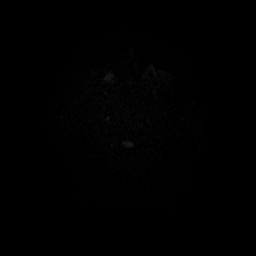
[im 17/98]
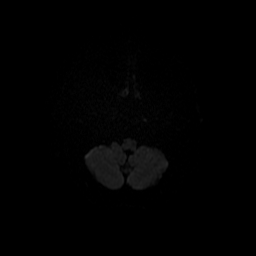
[im 33/98]
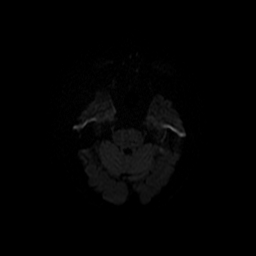
[im 49/98]
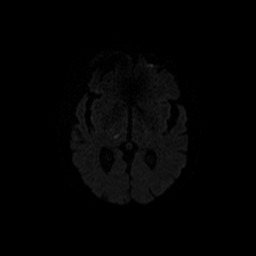
[im 65/98]
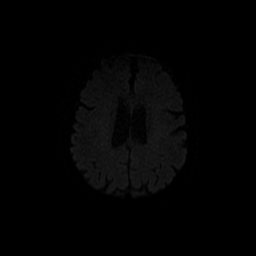
[im 81/98]
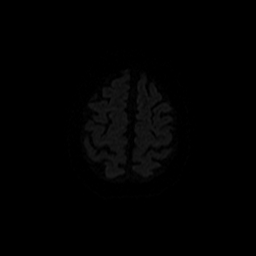
[im 98/98]
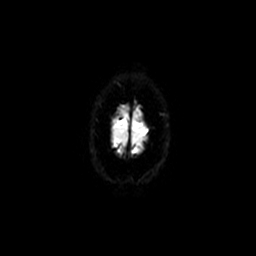

[Series 4: (id) mt fs · axial · 1.4mm · 0.43mm/px · z∈[-62,-20]mm · 4 of 136 slices shown]
[im 1/136]
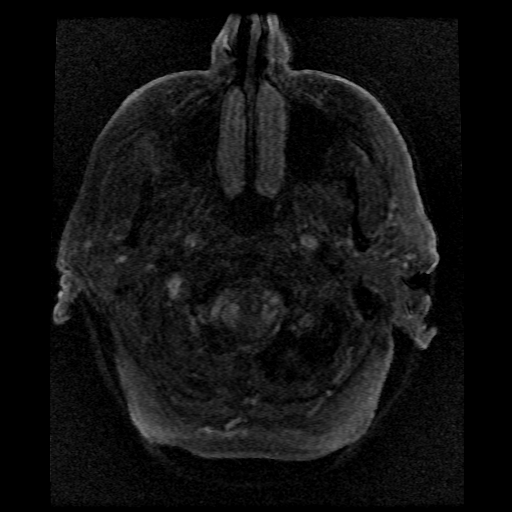
[im 16/136]
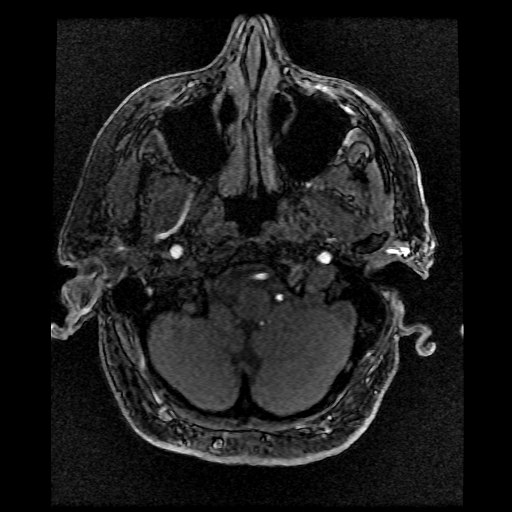
[im 46/136]
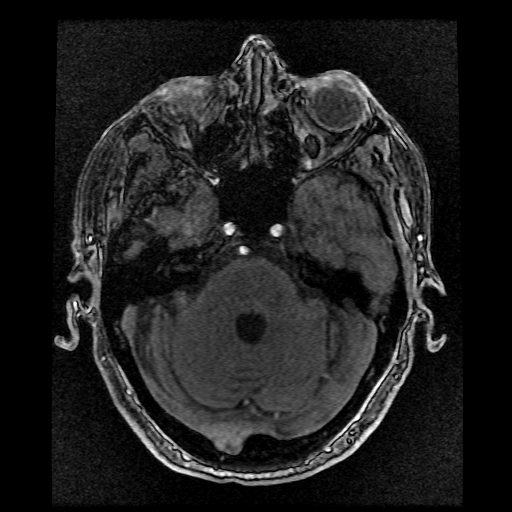
[im 61/136]
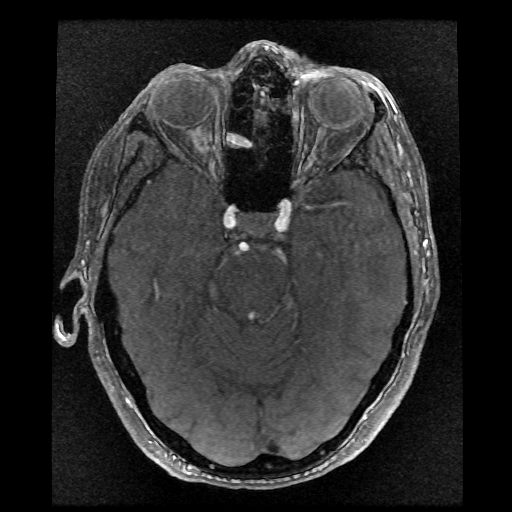

[Series 5: DWI · coronal · 5.0mm · 1.09mm/px · 6 of 76 slices shown (2 of 4)]
[im 1/76]
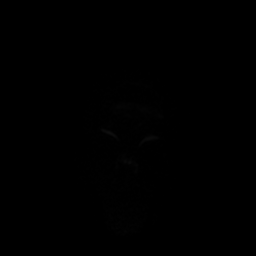
[im 16/76]
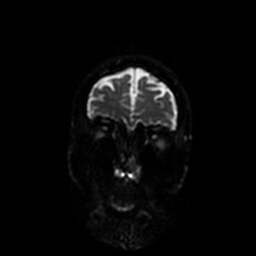
[im 31/76]
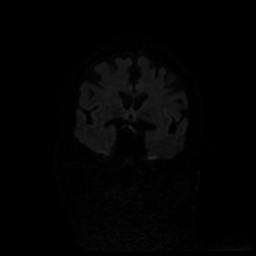
[im 46/76]
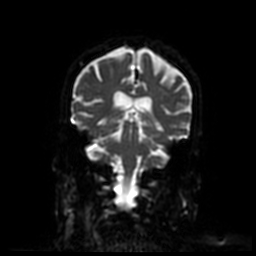
[im 61/76]
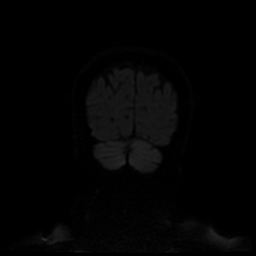
[im 76/76]
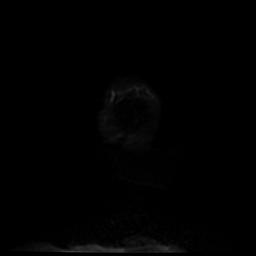

[Series 6: T1 · sagittal · 5.0mm · 0.47mm/px · 2 of 23 slices shown]
[im 1/23]
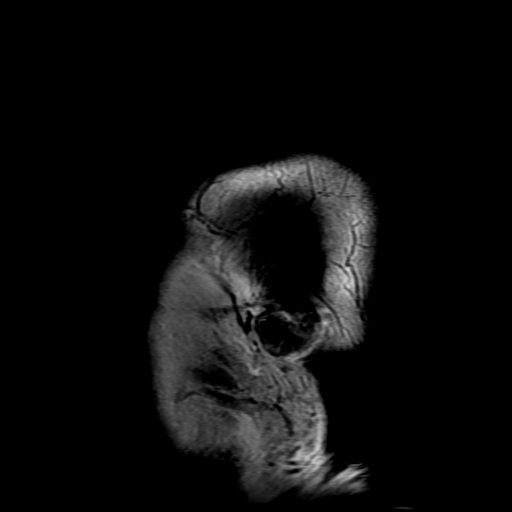
[im 23/23]
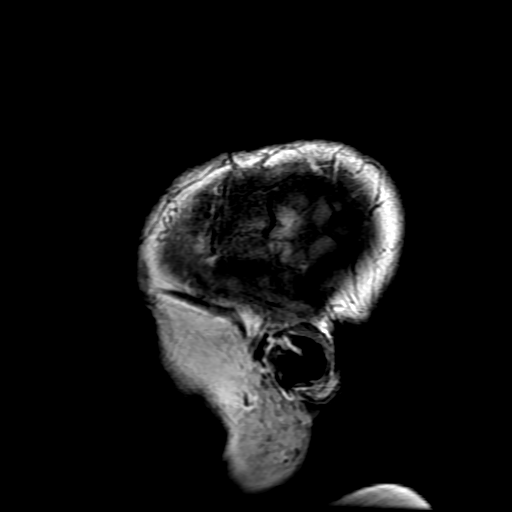

[Series 7: T2 · axial · 5.0mm · 0.43mm/px · z∈[-72,+72]mm · 2 of 25 slices shown (1 of 2)]
[im 1/25]
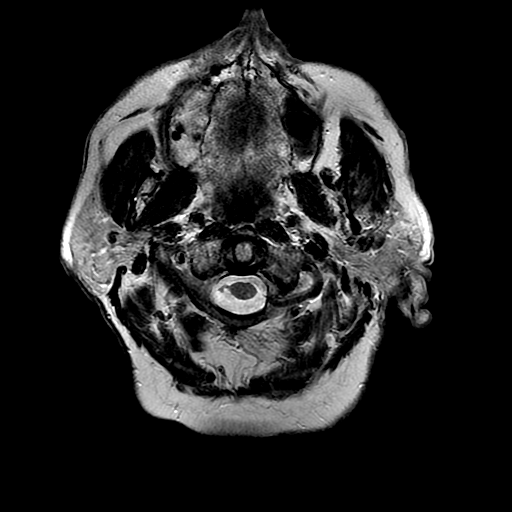
[im 25/25]
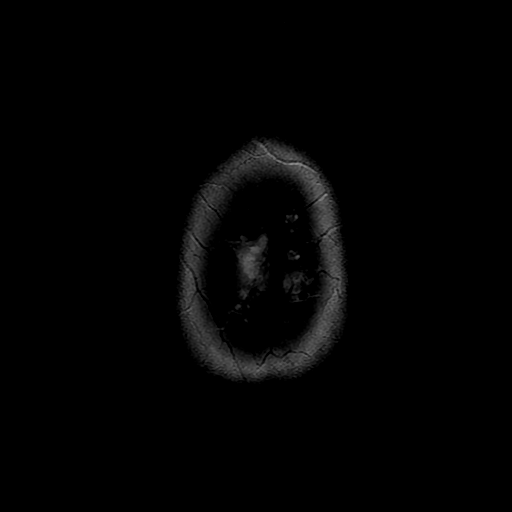

[Series 9: FLAIR · axial · 5.0mm · 0.43mm/px · z∈[-70,+68]mm · 2 of 24 slices shown]
[im 1/24]
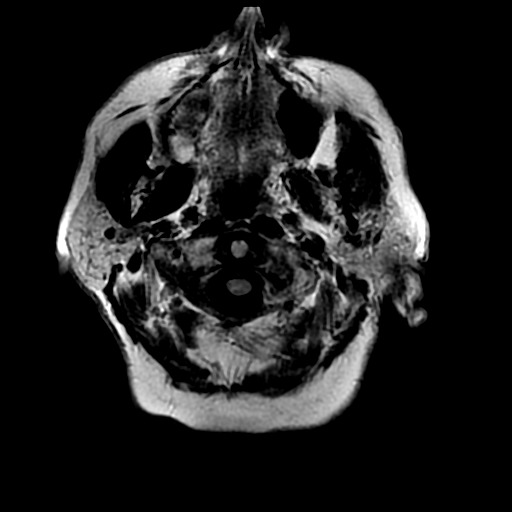
[im 24/24]
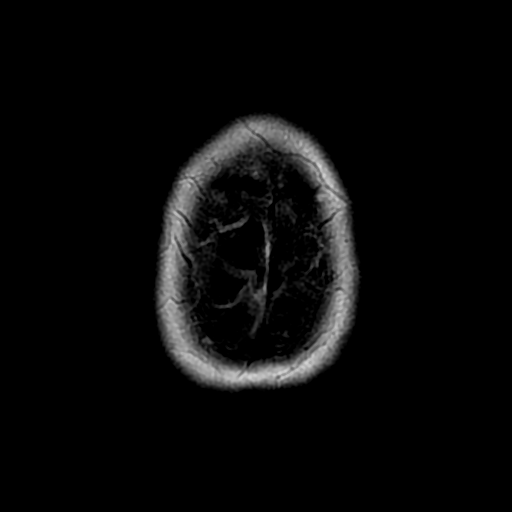

[Series 11: T2 · coronal · 5.0mm · 0.43mm/px · 2 of 26 slices shown (2 of 2)]
[im 1/26]
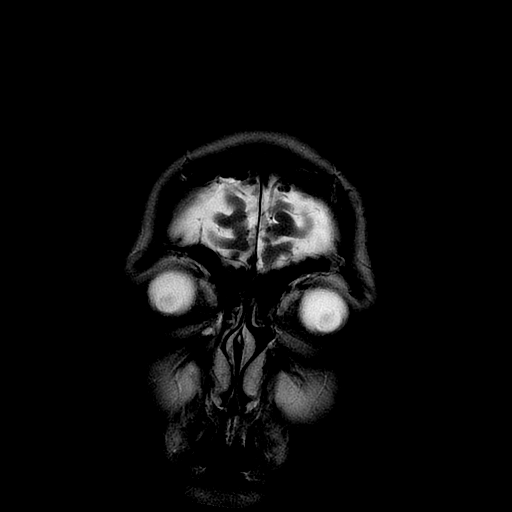
[im 26/26]
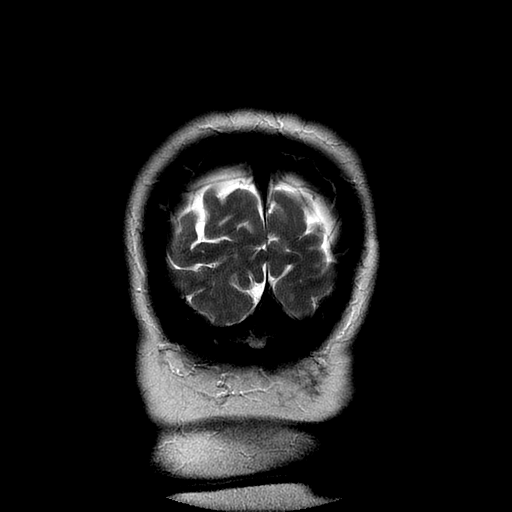

[Series 300: DWI · axial · 3.0mm · 1.09mm/px · z∈[-71,+72]mm · 4 of 49 slices shown (3 of 4)]
[im 1/49]
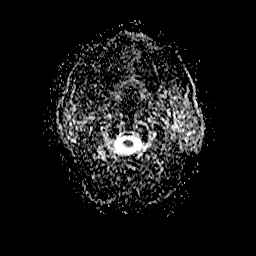
[im 17/49]
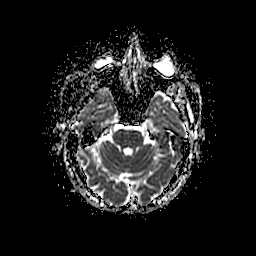
[im 33/49]
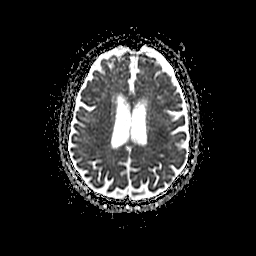
[im 49/49]
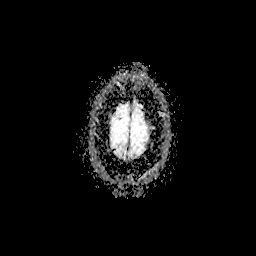

[Series 500: DWI · coronal · 5.0mm · 1.09mm/px · 3 of 38 slices shown (4 of 4)]
[im 1/38]
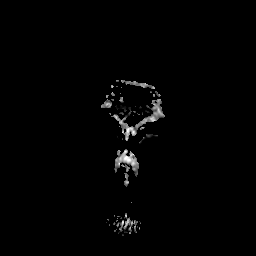
[im 19/38]
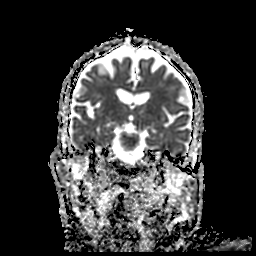
[im 38/38]
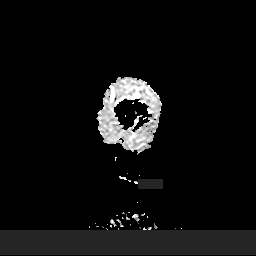

[32 of 48 positions shown; findings below may reference images not displayed]

FINDINGS: MRI HEAD FINDINGS

Brain: Small 7 mm oval area of restricted diffusion in the central
right thalamus (series 3, image 25). No associated hemorrhage, T2
FLAIR hyperintensity or mass effect.

No other convincing restricted diffusion. No midline shift, mass
effect, evidence of mass lesion, ventriculomegaly, extra-axial
collection or acute intracranial hemorrhage. Cervicomedullary
junction and pituitary are within normal limits.

Mild T2 heterogeneity elsewhere in the deep gray matter - including
the right basal ganglia - nuclei more resembles small perivascular
spaces (normal variant). Mild for age scattered cerebral white
matter T2 and FLAIR hyperintensity. No cortical encephalomalacia or
chronic cerebral blood products. Negative brainstem and cerebellum.

Vascular: Major intracranial vascular flow voids are preserved.
Generalized intracranial artery tortuosity.

Skull and upper cervical spine: Negative visualized cervical spine.
Visualized bone marrow signal is within normal limits.

Sinuses/Orbits: Postoperative changes to both globes. Otherwise
normal orbits soft tissues.

Paranasal sinuses and mastoids are stable and well pneumatized.

Other: Visible internal auditory structures appear normal. Scalp and
face soft tissues appear negative.

MRA HEAD FINDINGS

Antegrade flow in the posterior circulation tortuous distal
vertebral arteries without stenosis. Right PICA is visible and
patent. Patent basilar artery with tortuosity but no stenosis. SCA
and PCA origins are normal. The right posterior communicating artery
is present, the left might be diminutive or absent. Right PCA P1
segment an bilateral PCA branches are normal.

Antegrade flow in both ICA siphons. No siphon stenosis. Normal
ophthalmic and posterior communicating artery origins. Patent
carotid termini. Normal MCA and ACA origins. Diminutive anterior
communicating artery. Visible ACA branches are within normal limits.
MCA M1 segments, MCA bifurcations and visible bilateral MCA branches
are within normal limits.
IMPRESSION: 1. Small acute lacunar infarct in the central right thalamus. No
associated hemorrhage or mass effect.
2. No other acute intracranial abnormality, and otherwise mild for
age nonspecific chronic signal changes in the brain.
3. Intracranial artery tortuosity but otherwise negative
intracranial MRA.

## 2018-02-07 ENCOUNTER — Other Ambulatory Visit: Payer: Self-pay | Admitting: Family Medicine

## 2018-02-07 DIAGNOSIS — E785 Hyperlipidemia, unspecified: Secondary | ICD-10-CM

## 2018-02-09 ENCOUNTER — Telehealth: Payer: Self-pay | Admitting: Family Medicine

## 2018-02-09 NOTE — Telephone Encounter (Signed)
Refills sent on 02/07/2018.

## 2018-02-09 NOTE — Telephone Encounter (Signed)
Copied from [place] [id number]. Topic: Quick Communication - Rx Refill/Question >> Feb 09, 2018  4:04 PM Judyann Munson wrote: Medication: meloxicam (MOBIC) 7.5 MG tablet Has the patient contacted their pharmacy? No   Preferred Pharmacy (with phone number or street name): [place], [place] [phone] (Phone) [phone] (Fax)      Agent: Please be advised that RX refills may take up to 3 business days. We ask that you follow-up with your pharmacy.

## 2018-02-23 DIAGNOSIS — H353221 Exudative age-related macular degeneration, left eye, with active choroidal neovascularization: Secondary | ICD-10-CM | POA: Diagnosis not present

## 2018-02-23 DIAGNOSIS — H353111 Nonexudative age-related macular degeneration, right eye, early dry stage: Secondary | ICD-10-CM | POA: Diagnosis not present

## 2018-02-23 DIAGNOSIS — H43812 Vitreous degeneration, left eye: Secondary | ICD-10-CM | POA: Diagnosis not present

## 2018-03-07 DIAGNOSIS — L570 Actinic keratosis: Secondary | ICD-10-CM | POA: Diagnosis not present

## 2018-03-07 DIAGNOSIS — L821 Other seborrheic keratosis: Secondary | ICD-10-CM | POA: Diagnosis not present

## 2018-03-07 DIAGNOSIS — Z08 Encounter for follow-up examination after completed treatment for malignant neoplasm: Secondary | ICD-10-CM | POA: Diagnosis not present

## 2018-03-07 DIAGNOSIS — Z85828 Personal history of other malignant neoplasm of skin: Secondary | ICD-10-CM | POA: Diagnosis not present

## 2018-03-11 DIAGNOSIS — K648 Other hemorrhoids: Secondary | ICD-10-CM | POA: Diagnosis not present

## 2018-04-14 DIAGNOSIS — H353221 Exudative age-related macular degeneration, left eye, with active choroidal neovascularization: Secondary | ICD-10-CM | POA: Diagnosis not present

## 2018-04-20 DIAGNOSIS — I6381 Other cerebral infarction due to occlusion or stenosis of small artery: Secondary | ICD-10-CM | POA: Diagnosis not present

## 2018-04-20 DIAGNOSIS — I482 Chronic atrial fibrillation, unspecified: Secondary | ICD-10-CM | POA: Diagnosis not present

## 2018-05-04 DIAGNOSIS — M50322 Other cervical disc degeneration at C5-C6 level: Secondary | ICD-10-CM | POA: Diagnosis not present

## 2018-05-04 DIAGNOSIS — M19072 Primary osteoarthritis, left ankle and foot: Secondary | ICD-10-CM | POA: Diagnosis not present

## 2018-05-21 ENCOUNTER — Other Ambulatory Visit: Payer: Self-pay | Admitting: Family Medicine

## 2018-05-21 DIAGNOSIS — E785 Hyperlipidemia, unspecified: Secondary | ICD-10-CM

## 2018-05-26 DIAGNOSIS — M75102 Unspecified rotator cuff tear or rupture of left shoulder, not specified as traumatic: Secondary | ICD-10-CM | POA: Diagnosis not present

## 2018-05-26 DIAGNOSIS — M19019 Primary osteoarthritis, unspecified shoulder: Secondary | ICD-10-CM | POA: Diagnosis not present

## 2018-06-02 DIAGNOSIS — H353221 Exudative age-related macular degeneration, left eye, with active choroidal neovascularization: Secondary | ICD-10-CM | POA: Diagnosis not present

## 2018-06-15 DIAGNOSIS — L578 Other skin changes due to chronic exposure to nonionizing radiation: Secondary | ICD-10-CM | POA: Diagnosis not present

## 2018-06-15 DIAGNOSIS — Z85828 Personal history of other malignant neoplasm of skin: Secondary | ICD-10-CM | POA: Diagnosis not present

## 2018-06-15 DIAGNOSIS — L570 Actinic keratosis: Secondary | ICD-10-CM | POA: Diagnosis not present

## 2018-06-29 DIAGNOSIS — M75102 Unspecified rotator cuff tear or rupture of left shoulder, not specified as traumatic: Secondary | ICD-10-CM | POA: Diagnosis not present

## 2018-06-29 DIAGNOSIS — M7522 Bicipital tendinitis, left shoulder: Secondary | ICD-10-CM | POA: Diagnosis not present

## 2018-06-29 DIAGNOSIS — S4992XA Unspecified injury of left shoulder and upper arm, initial encounter: Secondary | ICD-10-CM | POA: Diagnosis not present

## 2018-07-04 DIAGNOSIS — K645 Perianal venous thrombosis: Secondary | ICD-10-CM | POA: Diagnosis not present

## 2018-07-21 DIAGNOSIS — H43812 Vitreous degeneration, left eye: Secondary | ICD-10-CM | POA: Diagnosis not present

## 2018-07-21 DIAGNOSIS — H353111 Nonexudative age-related macular degeneration, right eye, early dry stage: Secondary | ICD-10-CM | POA: Diagnosis not present

## 2018-07-21 DIAGNOSIS — H353221 Exudative age-related macular degeneration, left eye, with active choroidal neovascularization: Secondary | ICD-10-CM | POA: Diagnosis not present

## 2018-08-05 DIAGNOSIS — K625 Hemorrhage of anus and rectum: Secondary | ICD-10-CM | POA: Diagnosis not present

## 2018-08-05 DIAGNOSIS — K644 Residual hemorrhoidal skin tags: Secondary | ICD-10-CM | POA: Diagnosis not present

## 2018-08-08 ENCOUNTER — Other Ambulatory Visit: Payer: Self-pay | Admitting: Family Medicine

## 2018-08-08 DIAGNOSIS — E785 Hyperlipidemia, unspecified: Secondary | ICD-10-CM

## 2018-08-10 ENCOUNTER — Other Ambulatory Visit: Payer: Self-pay | Admitting: Family Medicine

## 2018-08-15 ENCOUNTER — Other Ambulatory Visit: Payer: Self-pay | Admitting: Family Medicine

## 2018-08-23 ENCOUNTER — Ambulatory Visit (INDEPENDENT_AMBULATORY_CARE_PROVIDER_SITE_OTHER): Payer: Medicare Other | Admitting: Family Medicine

## 2018-08-23 ENCOUNTER — Encounter: Payer: Self-pay | Admitting: Family Medicine

## 2018-08-23 VITALS — BP 116/70 | HR 56 | Temp 98.0°F | Resp 16 | Ht 62.0 in | Wt 196.0 lb

## 2018-08-23 DIAGNOSIS — E785 Hyperlipidemia, unspecified: Secondary | ICD-10-CM

## 2018-08-23 DIAGNOSIS — M159 Polyosteoarthritis, unspecified: Secondary | ICD-10-CM

## 2018-08-23 DIAGNOSIS — I480 Paroxysmal atrial fibrillation: Secondary | ICD-10-CM | POA: Diagnosis not present

## 2018-08-23 DIAGNOSIS — I10 Essential (primary) hypertension: Secondary | ICD-10-CM

## 2018-08-23 DIAGNOSIS — N183 Chronic kidney disease, stage 3 unspecified: Secondary | ICD-10-CM

## 2018-08-23 MED ORDER — AMLODIPINE BESYLATE 5 MG PO TABS: 5.0000 mg | ORAL_TABLET | Freq: Every day | ORAL | 1 refills | Status: DC

## 2018-08-23 MED ORDER — MELOXICAM 7.5 MG PO TABS: 7.5000 mg | ORAL_TABLET | Freq: Every day | ORAL | 1 refills | Status: DC

## 2018-08-23 MED ORDER — SIMVASTATIN 20 MG PO TABS: ORAL_TABLET | ORAL | 1 refills | Status: DC

## 2018-08-23 NOTE — Progress Notes (Signed)
Patient ID: Helen Wilson, female    DOB: 01-04-1932  Age: 85 y.o. MRN: [id number]    Subjective:  Subjective  HPI Silvanna Ohmer presents for htn and cholesterol.  No new complaints.    Review of Systems  Constitutional: Negative for appetite change, chills, diaphoresis, fatigue, fever and unexpected weight change.  HENT: Negative for congestion and hearing loss.   Eyes: Negative for pain, discharge, redness and visual disturbance.  Respiratory: Negative for cough, chest tightness, shortness of breath and wheezing.   Cardiovascular: Negative for chest pain, palpitations and leg swelling.  Gastrointestinal: Negative for abdominal pain, blood in stool, constipation, diarrhea, nausea and vomiting.  Endocrine: Negative for cold intolerance, heat intolerance, polydipsia, polyphagia and polyuria.  Genitourinary: Negative for difficulty urinating, dysuria, frequency, hematuria and urgency.  Musculoskeletal: Negative for back pain and myalgias.  Skin: Negative for rash.  Allergic/Immunologic: Negative for environmental allergies.  Neurological: Negative for dizziness, weakness, light-headedness, numbness and headaches.  Hematological: Does not bruise/bleed easily.  Psychiatric/Behavioral: Negative for suicidal ideas. The patient is not nervous/anxious.     History Past Medical History:  Diagnosis Date  . Arthritis   . Atrial fibrillation ([place])   . Coronary artery disease   . Heart murmur   . Hemorrhoids   . Hyperlipidemia   . Hypertension   . Macular degeneration   . Osteoporosis   . Stroke [facility])     She has a past surgical history that includes Abdominal hysterectomy; Breast biopsy; Knee arthroscopy; LEEP; and Flexible sigmoidoscopy (N/A, 07/14/2017).   Her family history includes Arthritis in her unknown relative; Diabetes in her unknown relative; Heart disease in her unknown relative; Parkinsonism in her mother; Rheum arthritis in her mother; Stroke in her father.She reports that she has  never smoked. She has never used smokeless tobacco. She reports current alcohol use. She reports that she does not use drugs.  Current Outpatient Medications on File Prior to Visit  Medication Sig Dispense Refill  . sotalol (BETAPACE) 120 MG tablet Take 120 mg by mouth 2 (two) times daily.   1  . triamcinolone cream (KENALOG) 0.1 % APPLY  CREAM EXTERNALLY THREE TIMES DAILY     No current facility-administered medications on file prior to visit.      Objective:  Objective  Physical Exam Vitals signs and nursing note reviewed.  Constitutional:      Appearance: She is well-developed.  HENT:     Head: Normocephalic and atraumatic.  Eyes:     Conjunctiva/sclera: Conjunctivae normal.  Neck:     Musculoskeletal: Normal range of motion and neck supple.     Thyroid: No thyromegaly.     Vascular: No carotid bruit or JVD.  Cardiovascular:     Rate and Rhythm: Normal rate and regular rhythm.     Heart sounds: Murmur present.  Pulmonary:     Effort: Pulmonary effort is normal. No respiratory distress.     Breath sounds: Normal breath sounds. No wheezing or rales.  Chest:     Chest wall: No tenderness.  Neurological:     Mental Status: She is alert and oriented to person, place, and time.    BP 116/70 (BP Location: Left Arm, Cuff Size: Large)   Pulse (!) 56   Temp 98 F (36.7 C) (Oral)   Resp 16   Ht 5\' 2"  (1.575 m)   Wt 196 lb (88.9 kg)   SpO2 96%   BMI 35.85 kg/m  Wt Readings from Last 3 Encounters:  08/23/18 196 lb (  88.9 kg)  07/26/17 199 lb 6.4 oz (90.4 kg)  07/19/17 196 lb 12.8 oz (89.3 kg)     Lab Results  Component Value Date   WBC 6.2 07/26/2017   HGB 11.3 (L) 07/26/2017   HCT 34.8 (L) 07/26/2017   PLT 210.0 07/26/2017   GLUCOSE 143 (H) 07/26/2017   CHOL 154 06/27/2017   TRIG 78 06/27/2017   HDL 65 06/27/2017   LDLDIRECT 173.7 03/06/2013   LDLCALC 73 06/27/2017   ALT 15 07/26/2017   AST 19 07/26/2017   NA 143 07/26/2017   K 3.9 07/26/2017   CL 109  07/26/2017   CREATININE 1.33 (H) 07/26/2017   BUN 26 (H) 07/26/2017   CO2 28 07/26/2017   TSH 2.07 01/15/2011   INR 1.59 07/14/2017   HGBA1C 5.7 (H) 07/14/2017    Nm Gi Blood Loss  Result Date: 07/14/2017 CLINICAL DATA:  Lower GI bleeding. EXAM: NUCLEAR MEDICINE GASTROINTESTINAL BLEEDING SCAN TECHNIQUE: Sequential abdominal images were obtained following intravenous administration of Tc-29m labeled red blood cells. RADIOPHARMACEUTICALS:  28.9 mCi Tc-32m in-vitro labeled red cells. COMPARISON:  None. FINDINGS: Physiologic distribution of radiopharmaceutical activity is seen. No abnormal sites of radiopharmaceutical activity seen within the abdomen or pelvis. IMPRESSION: Negative.  No evidence of active GI bleeding. Electronically Signed   By: Earle Gell M.D.   On: 07/14/2017 12:02     Assessment & Plan:  Plan  I have discontinued Zenya Dauphine's traMADol and metroNIDAZOLE. I have also changed her meloxicam and amLODipine. Additionally, I am having her maintain her sotalol, triamcinolone cream, and simvastatin.  Meds ordered this encounter  Medications  . simvastatin (ZOCOR) 20 MG tablet    Sig: TAKE 1 TABLET BY MOUTH ONCE DAILY WITH BREAKFAST    Dispense:  90 tablet    Refill:  1  . meloxicam (MOBIC) 7.5 MG tablet    Sig: Take 1 tablet (7.5 mg total) by mouth daily.    Dispense:  90 tablet    Refill:  1  . amLODipine (NORVASC) 5 MG tablet    Sig: Take 1 tablet (5 mg total) by mouth daily.    Dispense:  90 tablet    Refill:  1    Problem List Items Addressed This Visit      Unprioritized   Atrial fibrillation [facility])    Per cardiology       Relevant Medications   simvastatin (ZOCOR) 20 MG tablet   amLODipine (NORVASC) 5 MG tablet   Chronic renal insufficiency, stage III (moderate) (HCC)    Check labs today      Essential hypertension - Primary    Well controlled, no changes to meds. Encouraged heart healthy diet such as the DASH diet and exercise as tolerated.        Relevant Medications   simvastatin (ZOCOR) 20 MG tablet   amLODipine (NORVASC) 5 MG tablet   Other Relevant Orders   Lipid panel   Comprehensive metabolic panel   Hyperlipidemia    Tolerating statin, encouraged heart healthy diet, avoid trans fats, minimize simple carbs and saturated fats. Increase exercise as tolerated      Relevant Medications   simvastatin (ZOCOR) 20 MG tablet   amLODipine (NORVASC) 5 MG tablet   Other Relevant Orders   Lipid panel   Comprehensive metabolic panel   Osteoarthritis    Stable with meds      Relevant Medications   meloxicam (MOBIC) 7.5 MG tablet      Follow-up: Return in about 6  months (around 02/21/2019), or if symptoms worsen or fail to improve, for hypertension, hyperlipidemia.  Ann Held, DO

## 2018-08-23 NOTE — Assessment & Plan Note (Signed)
Tolerating statin, encouraged heart healthy diet, avoid trans fats, minimize simple carbs and saturated fats. Increase exercise as tolerated 

## 2018-08-23 NOTE — Assessment & Plan Note (Signed)
Stable with meds 

## 2018-08-23 NOTE — Assessment & Plan Note (Signed)
Per cardiology 

## 2018-08-23 NOTE — Assessment & Plan Note (Signed)
Check labs today.

## 2018-08-23 NOTE — Patient Instructions (Signed)

## 2018-08-23 NOTE — Assessment & Plan Note (Signed)
Well controlled, no changes to meds. Encouraged heart healthy diet such as the DASH diet and exercise as tolerated.  °

## 2018-08-24 LAB — LIPID PANEL
Cholesterol: 163 mg/dL (ref 0–200)
HDL: 69.4 mg/dL (ref >39.00)
LDL Cholesterol: 79 mg/dL (ref 0–99)
NonHDL: 94.04
Total CHOL/HDL Ratio: 2
Triglycerides: 75 mg/dL (ref 0.0–149.0)
VLDL: 15 mg/dL (ref 0.0–40.0)

## 2018-08-24 LAB — COMPREHENSIVE METABOLIC PANEL
ALT: 22 U/L (ref 0–35)
AST: 22 U/L (ref 0–37)
Albumin: 4.2 g/dL (ref 3.5–5.2)
Alkaline Phosphatase: 82 U/L (ref 39–117)
BUN: 24 mg/dL — ABNORMAL HIGH (ref 6–23)
CO2: 27 mEq/L (ref 19–32)
Calcium: 9.6 mg/dL (ref 8.4–10.5)
Chloride: 107 mEq/L (ref 96–112)
Creatinine, Ser: 1.21 mg/dL — ABNORMAL HIGH (ref 0.40–1.20)
GFR: 42.34 mL/min — ABNORMAL LOW (ref >60.00)
Glucose, Bld: 95 mg/dL (ref 70–99)
Potassium: 4.4 mEq/L (ref 3.5–5.1)
Sodium: 144 mEq/L (ref 135–145)
Total Bilirubin: 0.9 mg/dL (ref 0.2–1.2)
Total Protein: 6.7 g/dL (ref 6.0–8.3)

## 2018-08-26 DIAGNOSIS — H18413 Arcus senilis, bilateral: Secondary | ICD-10-CM | POA: Diagnosis not present

## 2018-08-26 DIAGNOSIS — Z961 Presence of intraocular lens: Secondary | ICD-10-CM | POA: Diagnosis not present

## 2018-08-26 DIAGNOSIS — H26492 Other secondary cataract, left eye: Secondary | ICD-10-CM | POA: Diagnosis not present

## 2018-08-26 DIAGNOSIS — H02831 Dermatochalasis of right upper eyelid: Secondary | ICD-10-CM | POA: Diagnosis not present

## 2018-09-07 DIAGNOSIS — M5412 Radiculopathy, cervical region: Secondary | ICD-10-CM | POA: Diagnosis not present

## 2018-09-07 DIAGNOSIS — M75102 Unspecified rotator cuff tear or rupture of left shoulder, not specified as traumatic: Secondary | ICD-10-CM | POA: Diagnosis not present

## 2018-09-08 DIAGNOSIS — H353221 Exudative age-related macular degeneration, left eye, with active choroidal neovascularization: Secondary | ICD-10-CM | POA: Diagnosis not present

## 2018-10-27 DIAGNOSIS — H353221 Exudative age-related macular degeneration, left eye, with active choroidal neovascularization: Secondary | ICD-10-CM | POA: Diagnosis not present

## 2018-11-16 DIAGNOSIS — M75102 Unspecified rotator cuff tear or rupture of left shoulder, not specified as traumatic: Secondary | ICD-10-CM | POA: Diagnosis not present

## 2018-12-09 DIAGNOSIS — M7522 Bicipital tendinitis, left shoulder: Secondary | ICD-10-CM | POA: Diagnosis not present

## 2018-12-14 DIAGNOSIS — H353221 Exudative age-related macular degeneration, left eye, with active choroidal neovascularization: Secondary | ICD-10-CM | POA: Diagnosis not present

## 2019-01-09 DIAGNOSIS — E7800 Pure hypercholesterolemia, unspecified: Secondary | ICD-10-CM | POA: Diagnosis not present

## 2019-01-09 DIAGNOSIS — I358 Other nonrheumatic aortic valve disorders: Secondary | ICD-10-CM | POA: Diagnosis not present

## 2019-01-09 DIAGNOSIS — I480 Paroxysmal atrial fibrillation: Secondary | ICD-10-CM | POA: Diagnosis not present

## 2019-01-09 DIAGNOSIS — I10 Essential (primary) hypertension: Secondary | ICD-10-CM | POA: Diagnosis not present

## 2019-01-10 DIAGNOSIS — R001 Bradycardia, unspecified: Secondary | ICD-10-CM | POA: Diagnosis not present

## 2019-02-02 ENCOUNTER — Other Ambulatory Visit: Payer: Self-pay | Admitting: Family Medicine

## 2019-02-02 DIAGNOSIS — H353221 Exudative age-related macular degeneration, left eye, with active choroidal neovascularization: Secondary | ICD-10-CM | POA: Diagnosis not present

## 2019-02-02 DIAGNOSIS — M159 Polyosteoarthritis, unspecified: Secondary | ICD-10-CM

## 2019-02-09 ENCOUNTER — Other Ambulatory Visit: Payer: Self-pay

## 2019-03-15 ENCOUNTER — Other Ambulatory Visit: Payer: Self-pay | Admitting: Family Medicine

## 2019-03-15 DIAGNOSIS — E785 Hyperlipidemia, unspecified: Secondary | ICD-10-CM

## 2019-03-21 ENCOUNTER — Other Ambulatory Visit: Payer: Self-pay | Admitting: Family Medicine

## 2019-03-24 ENCOUNTER — Other Ambulatory Visit: Payer: Self-pay

## 2019-03-24 ENCOUNTER — Encounter: Payer: Self-pay | Admitting: Family Medicine

## 2019-03-24 ENCOUNTER — Ambulatory Visit (INDEPENDENT_AMBULATORY_CARE_PROVIDER_SITE_OTHER): Payer: Medicare Other | Admitting: Family Medicine

## 2019-03-24 VITALS — BP 114/60 | HR 53 | Temp 97.7°F | Resp 18 | Ht 62.0 in | Wt 193.6 lb

## 2019-03-24 DIAGNOSIS — N183 Chronic kidney disease, stage 3 unspecified: Secondary | ICD-10-CM

## 2019-03-24 DIAGNOSIS — M159 Polyosteoarthritis, unspecified: Secondary | ICD-10-CM | POA: Diagnosis not present

## 2019-03-24 DIAGNOSIS — I10 Essential (primary) hypertension: Secondary | ICD-10-CM | POA: Diagnosis not present

## 2019-03-24 DIAGNOSIS — I6381 Other cerebral infarction due to occlusion or stenosis of small artery: Secondary | ICD-10-CM | POA: Diagnosis not present

## 2019-03-24 DIAGNOSIS — E785 Hyperlipidemia, unspecified: Secondary | ICD-10-CM

## 2019-03-24 DIAGNOSIS — I480 Paroxysmal atrial fibrillation: Secondary | ICD-10-CM

## 2019-03-24 DIAGNOSIS — H353222 Exudative age-related macular degeneration, left eye, with inactive choroidal neovascularization: Secondary | ICD-10-CM | POA: Diagnosis not present

## 2019-03-24 DIAGNOSIS — H353111 Nonexudative age-related macular degeneration, right eye, early dry stage: Secondary | ICD-10-CM | POA: Diagnosis not present

## 2019-03-24 DIAGNOSIS — H43812 Vitreous degeneration, left eye: Secondary | ICD-10-CM | POA: Diagnosis not present

## 2019-03-24 LAB — LIPID PANEL
Cholesterol: 129 mg/dL (ref 0–200)
HDL: 61.5 mg/dL (ref >39.00)
LDL Cholesterol: 56 mg/dL (ref 0–99)
NonHDL: 67.17
Total CHOL/HDL Ratio: 2
Triglycerides: 54 mg/dL (ref 0.0–149.0)
VLDL: 10.8 mg/dL (ref 0.0–40.0)

## 2019-03-24 LAB — COMPREHENSIVE METABOLIC PANEL
ALT: 12 U/L (ref 0–35)
AST: 17 U/L (ref 0–37)
Albumin: 3.9 g/dL (ref 3.5–5.2)
Alkaline Phosphatase: 66 U/L (ref 39–117)
BUN: 27 mg/dL — ABNORMAL HIGH (ref 6–23)
CO2: 28 mEq/L (ref 19–32)
Calcium: 9.4 mg/dL (ref 8.4–10.5)
Chloride: 111 mEq/L (ref 96–112)
Creatinine, Ser: 1.33 mg/dL — ABNORMAL HIGH (ref 0.40–1.20)
GFR: 37.91 mL/min — ABNORMAL LOW (ref >60.00)
Glucose, Bld: 104 mg/dL — ABNORMAL HIGH (ref 70–99)
Potassium: 4.5 mEq/L (ref 3.5–5.1)
Sodium: 146 mEq/L — ABNORMAL HIGH (ref 135–145)
Total Bilirubin: 1.1 mg/dL (ref 0.2–1.2)
Total Protein: 6.3 g/dL (ref 6.0–8.3)

## 2019-03-24 MED ORDER — AMLODIPINE BESYLATE 5 MG PO TABS: 5.0000 mg | ORAL_TABLET | Freq: Every day | ORAL | 1 refills | Status: AC

## 2019-03-24 MED ORDER — SOTALOL HCL 120 MG PO TABS: 120.0000 mg | ORAL_TABLET | Freq: Two times a day (BID) | ORAL | 1 refills | Status: AC

## 2019-03-24 MED ORDER — TRIAMCINOLONE ACETONIDE 0.1 % EX CREA: TOPICAL_CREAM | CUTANEOUS | 2 refills | Status: DC

## 2019-03-24 MED ORDER — TRIAMCINOLONE ACETONIDE 0.1 % EX CREA: TOPICAL_CREAM | CUTANEOUS | 2 refills | Status: AC

## 2019-03-24 MED ORDER — MELOXICAM 7.5 MG PO TABS: 7.5000 mg | ORAL_TABLET | Freq: Every day | ORAL | 0 refills | Status: DC

## 2019-03-24 NOTE — Assessment & Plan Note (Signed)
Stable Pt is moving to [place]

## 2019-03-24 NOTE — Assessment & Plan Note (Signed)
Pt refused to stop meloxicam because her OA pain was too bad so she did not go back to nephrology

## 2019-03-24 NOTE — Progress Notes (Signed)
Patient ID: Helen Wilson, female    DOB: 03/25/1932  Age: 85 y.o. MRN: [id number]    Subjective:  Subjective  HPI Helen Wilson presents for f/u bp and cholesterol.  Pt is moving to [place]   Review of Systems  Constitutional: Negative for appetite change, diaphoresis, fatigue and unexpected weight change.  Eyes: Negative for pain, redness and visual disturbance.  Respiratory: Negative for cough, chest tightness, shortness of breath and wheezing.   Cardiovascular: Negative for chest pain, palpitations and leg swelling.  Endocrine: Negative for cold intolerance, heat intolerance, polydipsia, polyphagia and polyuria.  Genitourinary: Negative for difficulty urinating, dysuria and frequency.  Neurological: Negative for dizziness, light-headedness, numbness and headaches.    History Past Medical History:  Diagnosis Date  . Arthritis   . Atrial fibrillation ([place])   . Coronary artery disease   . Heart murmur   . Hemorrhoids   . Hyperlipidemia   . Hypertension   . Macular degeneration   . Osteoporosis   . Stroke [facility])     She has a past surgical history that includes Abdominal hysterectomy; Breast biopsy; Knee arthroscopy; LEEP; and Flexible sigmoidoscopy (N/A, 07/14/2017).   Her family history includes Arthritis in her unknown relative; Diabetes in her unknown relative; Heart disease in her unknown relative; Parkinsonism in her mother; Rheum arthritis in her mother; Stroke in her father.She reports that she has never smoked. She has never used smokeless tobacco. She reports current alcohol use. She reports that she does not use drugs.  Current Outpatient Medications on File Prior to Visit  Medication Sig Dispense Refill  . simvastatin (ZOCOR) 20 MG tablet TAKE 1 TABLET BY MOUTH ONCE DAILY WITH BREAKFAST.  NEEDS OV 90 tablet 0   No current facility-administered medications on file prior to visit.      Objective:  Objective  Physical Exam Vitals signs and nursing note reviewed.   Constitutional:      Appearance: She is well-developed.  HENT:     Head: Normocephalic and atraumatic.  Eyes:     Conjunctiva/sclera: Conjunctivae normal.  Neck:     Musculoskeletal: Normal range of motion and neck supple.     Thyroid: No thyromegaly.     Vascular: No carotid bruit or JVD.  Cardiovascular:     Rate and Rhythm: Normal rate and regular rhythm.     Heart sounds: Normal heart sounds. No murmur.  Pulmonary:     Effort: Pulmonary effort is normal. No respiratory distress.     Breath sounds: Normal breath sounds. No wheezing or rales.  Chest:     Chest wall: No tenderness.  Neurological:     Mental Status: She is alert and oriented to person, place, and time.    BP 114/60 (BP Location: Left Arm, Patient Position: Sitting, Cuff Size: Normal)   Pulse (!) 53   Temp 97.7 F (36.5 C) (Temporal)   Resp 18   Ht 5\' 2"  (1.575 m)   Wt 193 lb 9.6 oz (87.8 kg)   SpO2 97%   BMI 35.41 kg/m  Wt Readings from Last 3 Encounters:  03/24/19 193 lb 9.6 oz (87.8 kg)  08/23/18 196 lb (88.9 kg)  07/26/17 199 lb 6.4 oz (90.4 kg)     Lab Results  Component Value Date   WBC 6.2 07/26/2017   HGB 11.3 (L) 07/26/2017   HCT 34.8 (L) 07/26/2017   PLT 210.0 07/26/2017   GLUCOSE 95 08/23/2018   CHOL 163 08/23/2018   TRIG 75.0 08/23/2018   HDL 69.40  08/23/2018   LDLDIRECT 173.7 03/06/2013   LDLCALC 79 08/23/2018   ALT 22 08/23/2018   AST 22 08/23/2018   NA 144 08/23/2018   K 4.4 08/23/2018   CL 107 08/23/2018   CREATININE 1.21 (H) 08/23/2018   BUN 24 (H) 08/23/2018   CO2 27 08/23/2018   TSH 2.07 01/15/2011   INR 1.59 07/14/2017   HGBA1C 5.7 (H) 07/14/2017    Nm Gi Blood Loss  Result Date: 07/14/2017 CLINICAL DATA:  Lower GI bleeding. EXAM: NUCLEAR MEDICINE GASTROINTESTINAL BLEEDING SCAN TECHNIQUE: Sequential abdominal images were obtained following intravenous administration of Tc-68m labeled red blood cells. RADIOPHARMACEUTICALS:  28.9 mCi Tc-36m in-vitro labeled red cells.  COMPARISON:  None. FINDINGS: Physiologic distribution of radiopharmaceutical activity is seen. No abnormal sites of radiopharmaceutical activity seen within the abdomen or pelvis. IMPRESSION: Negative.  No evidence of active GI bleeding. Electronically Signed   By: Earle Gell M.D.   On: 07/14/2017 12:02     Assessment & Plan:  Plan  I have changed Helen Wilson's meloxicam and sotalol. I am also having her maintain her simvastatin, amLODipine, and triamcinolone cream.  Meds ordered this encounter  Medications  . DISCONTD: triamcinolone cream (KENALOG) 0.1 %    Sig: APPLY  CREAM EXTERNALLY THREE TIMES DAILY    Dispense:  30 g    Refill:  2  . amLODipine (NORVASC) 5 MG tablet    Sig: Take 1 tablet (5 mg total) by mouth daily.    Dispense:  90 tablet    Refill:  1  . meloxicam (MOBIC) 7.5 MG tablet    Sig: Take 1 tablet (7.5 mg total) by mouth daily.    Dispense:  90 tablet    Refill:  0  . sotalol (BETAPACE) 120 MG tablet    Sig: Take 1 tablet (120 mg total) by mouth 2 (two) times daily.    Dispense:  180 tablet    Refill:  1  . triamcinolone cream (KENALOG) 0.1 %    Sig: APPLY  CREAM EXTERNALLY THREE TIMES DAILY    Dispense:  30 g    Refill:  2    Problem List Items Addressed This Visit      Unprioritized   Acute lacunar infarction ([place])    Stable Pt is moving to [facility]      Atrial fibrillation ([place])    Per cardilogy Pt will need to find a cardiologist in [place]      Relevant Medications   amLODipine (NORVASC) 5 MG tablet   sotalol (BETAPACE) 120 MG tablet   Chronic renal insufficiency, stage III (moderate) ([place])    Pt refused to stop meloxicam because her OA pain was too bad so she did not go back to nephrology      Essential hypertension    Well controlled, no changes to meds. Encouraged heart healthy diet such as the DASH diet and exercise as tolerated.       Relevant Medications   amLODipine (NORVASC) 5 MG tablet   sotalol (BETAPACE) 120 MG tablet   Other  Relevant Orders   Lipid panel   Comprehensive metabolic panel   Hyperlipidemia - Primary    Encouraged heart healthy diet, increase exercise, avoid trans fats, consider a krill oil cap daily      Relevant Medications   amLODipine (NORVASC) 5 MG tablet   sotalol (BETAPACE) 120 MG tablet   Other Relevant Orders   Lipid panel   Comprehensive metabolic panel   Osteoarthritis   Relevant  Medications   meloxicam (MOBIC) 7.5 MG tablet      Follow-up: Return if symptoms worsen or fail to improve.  Ann Held, DO

## 2019-03-24 NOTE — Patient Instructions (Signed)

## 2019-03-24 NOTE — Assessment & Plan Note (Signed)
Per cardilogy Pt will need to find a cardiologist in [facility]

## 2019-03-24 NOTE — Assessment & Plan Note (Signed)
Encouraged heart healthy diet, increase exercise, avoid trans fats, consider a krill oil cap daily 

## 2019-03-24 NOTE — Assessment & Plan Note (Signed)
Well controlled, no changes to meds. Encouraged heart healthy diet such as the DASH diet and exercise as tolerated.  °

## 2019-04-14 DIAGNOSIS — M7522 Bicipital tendinitis, left shoulder: Secondary | ICD-10-CM | POA: Diagnosis not present

## 2019-05-01 DIAGNOSIS — L821 Other seborrheic keratosis: Secondary | ICD-10-CM | POA: Diagnosis not present

## 2019-05-01 DIAGNOSIS — Z85828 Personal history of other malignant neoplasm of skin: Secondary | ICD-10-CM | POA: Diagnosis not present

## 2019-05-01 DIAGNOSIS — L578 Other skin changes due to chronic exposure to nonionizing radiation: Secondary | ICD-10-CM | POA: Diagnosis not present

## 2019-05-01 DIAGNOSIS — L570 Actinic keratosis: Secondary | ICD-10-CM | POA: Diagnosis not present

## 2019-05-24 DIAGNOSIS — H353221 Exudative age-related macular degeneration, left eye, with active choroidal neovascularization: Secondary | ICD-10-CM | POA: Diagnosis not present

## 2019-06-01 DIAGNOSIS — Z85828 Personal history of other malignant neoplasm of skin: Secondary | ICD-10-CM | POA: Diagnosis not present

## 2019-06-01 DIAGNOSIS — L570 Actinic keratosis: Secondary | ICD-10-CM | POA: Diagnosis not present

## 2019-06-01 DIAGNOSIS — M25511 Pain in right shoulder: Secondary | ICD-10-CM | POA: Diagnosis not present

## 2019-06-01 DIAGNOSIS — M25512 Pain in left shoulder: Secondary | ICD-10-CM | POA: Diagnosis not present

## 2019-06-01 DIAGNOSIS — L578 Other skin changes due to chronic exposure to nonionizing radiation: Secondary | ICD-10-CM | POA: Diagnosis not present

## 2019-06-01 DIAGNOSIS — I499 Cardiac arrhythmia, unspecified: Secondary | ICD-10-CM | POA: Diagnosis not present

## 2019-06-20 DIAGNOSIS — M19011 Primary osteoarthritis, right shoulder: Secondary | ICD-10-CM | POA: Diagnosis not present

## 2019-07-26 DIAGNOSIS — H353221 Exudative age-related macular degeneration, left eye, with active choroidal neovascularization: Secondary | ICD-10-CM | POA: Diagnosis not present

## 2019-07-27 DIAGNOSIS — I499 Cardiac arrhythmia, unspecified: Secondary | ICD-10-CM | POA: Diagnosis not present

## 2019-07-27 DIAGNOSIS — I150 Renovascular hypertension: Secondary | ICD-10-CM | POA: Diagnosis not present

## 2019-07-27 DIAGNOSIS — R001 Bradycardia, unspecified: Secondary | ICD-10-CM | POA: Diagnosis not present

## 2019-07-27 DIAGNOSIS — I4891 Unspecified atrial fibrillation: Secondary | ICD-10-CM | POA: Diagnosis not present

## 2019-07-27 DIAGNOSIS — R799 Abnormal finding of blood chemistry, unspecified: Secondary | ICD-10-CM | POA: Diagnosis not present

## 2019-07-27 DIAGNOSIS — I480 Paroxysmal atrial fibrillation: Secondary | ICD-10-CM | POA: Diagnosis not present

## 2019-07-27 DIAGNOSIS — G459 Transient cerebral ischemic attack, unspecified: Secondary | ICD-10-CM | POA: Diagnosis not present

## 2019-07-30 ENCOUNTER — Other Ambulatory Visit: Payer: Self-pay | Admitting: Family Medicine

## 2019-07-30 DIAGNOSIS — M159 Polyosteoarthritis, unspecified: Secondary | ICD-10-CM

## 2019-07-30 DIAGNOSIS — Z23 Encounter for immunization: Secondary | ICD-10-CM | POA: Diagnosis not present

## 2019-08-04 ENCOUNTER — Telehealth: Payer: Self-pay | Admitting: Family Medicine

## 2019-08-04 ENCOUNTER — Other Ambulatory Visit: Payer: Self-pay | Admitting: Family Medicine

## 2019-08-04 DIAGNOSIS — E785 Hyperlipidemia, unspecified: Secondary | ICD-10-CM

## 2019-08-04 NOTE — Telephone Encounter (Signed)
Called patient to schedule AWV, but no answer. Will try to call patient back at later time. SF °

## 2019-08-10 DIAGNOSIS — I499 Cardiac arrhythmia, unspecified: Secondary | ICD-10-CM | POA: Diagnosis not present

## 2019-08-11 DIAGNOSIS — R002 Palpitations: Secondary | ICD-10-CM | POA: Diagnosis not present

## 2019-08-15 DIAGNOSIS — I480 Paroxysmal atrial fibrillation: Secondary | ICD-10-CM | POA: Diagnosis not present

## 2019-08-15 DIAGNOSIS — E785 Hyperlipidemia, unspecified: Secondary | ICD-10-CM | POA: Diagnosis not present

## 2019-08-15 DIAGNOSIS — I10 Essential (primary) hypertension: Secondary | ICD-10-CM | POA: Diagnosis not present

## 2019-08-15 DIAGNOSIS — M19012 Primary osteoarthritis, left shoulder: Secondary | ICD-10-CM | POA: Diagnosis not present

## 2019-08-15 DIAGNOSIS — M19011 Primary osteoarthritis, right shoulder: Secondary | ICD-10-CM | POA: Diagnosis not present

## 2019-08-17 DIAGNOSIS — L570 Actinic keratosis: Secondary | ICD-10-CM | POA: Diagnosis not present

## 2019-08-17 DIAGNOSIS — N183 Chronic kidney disease, stage 3 unspecified: Secondary | ICD-10-CM | POA: Diagnosis not present

## 2019-08-17 DIAGNOSIS — E785 Hyperlipidemia, unspecified: Secondary | ICD-10-CM | POA: Diagnosis not present

## 2019-08-17 DIAGNOSIS — Z85828 Personal history of other malignant neoplasm of skin: Secondary | ICD-10-CM | POA: Diagnosis not present

## 2019-08-17 DIAGNOSIS — D485 Neoplasm of uncertain behavior of skin: Secondary | ICD-10-CM | POA: Diagnosis not present

## 2019-08-17 DIAGNOSIS — Z23 Encounter for immunization: Secondary | ICD-10-CM | POA: Diagnosis not present

## 2019-08-17 DIAGNOSIS — L578 Other skin changes due to chronic exposure to nonionizing radiation: Secondary | ICD-10-CM | POA: Diagnosis not present

## 2019-08-17 DIAGNOSIS — Z79899 Other long term (current) drug therapy: Secondary | ICD-10-CM | POA: Diagnosis not present

## 2019-08-17 DIAGNOSIS — I129 Hypertensive chronic kidney disease with stage 1 through stage 4 chronic kidney disease, or unspecified chronic kidney disease: Secondary | ICD-10-CM | POA: Diagnosis not present

## 2019-08-17 DIAGNOSIS — C44319 Basal cell carcinoma of skin of other parts of face: Secondary | ICD-10-CM | POA: Diagnosis not present

## 2019-09-08 DIAGNOSIS — Z01818 Encounter for other preprocedural examination: Secondary | ICD-10-CM | POA: Diagnosis not present

## 2019-09-08 DIAGNOSIS — K643 Fourth degree hemorrhoids: Secondary | ICD-10-CM | POA: Diagnosis not present

## 2019-09-08 DIAGNOSIS — K649 Unspecified hemorrhoids: Secondary | ICD-10-CM | POA: Diagnosis not present

## 2019-09-08 DIAGNOSIS — K625 Hemorrhage of anus and rectum: Secondary | ICD-10-CM | POA: Diagnosis not present

## 2019-09-12 DIAGNOSIS — K643 Fourth degree hemorrhoids: Secondary | ICD-10-CM | POA: Diagnosis not present

## 2019-09-12 DIAGNOSIS — Z20822 Contact with and (suspected) exposure to covid-19: Secondary | ICD-10-CM | POA: Diagnosis not present

## 2019-09-14 DIAGNOSIS — R002 Palpitations: Secondary | ICD-10-CM | POA: Diagnosis not present

## 2019-09-18 DIAGNOSIS — R339 Retention of urine, unspecified: Secondary | ICD-10-CM | POA: Diagnosis not present

## 2019-09-18 DIAGNOSIS — I10 Essential (primary) hypertension: Secondary | ICD-10-CM | POA: Diagnosis not present

## 2019-09-18 DIAGNOSIS — K643 Fourth degree hemorrhoids: Secondary | ICD-10-CM | POA: Diagnosis not present

## 2019-09-18 DIAGNOSIS — K625 Hemorrhage of anus and rectum: Secondary | ICD-10-CM | POA: Diagnosis not present

## 2019-09-20 DIAGNOSIS — H353221 Exudative age-related macular degeneration, left eye, with active choroidal neovascularization: Secondary | ICD-10-CM | POA: Diagnosis not present

## 2019-10-09 DIAGNOSIS — M19012 Primary osteoarthritis, left shoulder: Secondary | ICD-10-CM | POA: Diagnosis not present

## 2019-10-09 DIAGNOSIS — M19011 Primary osteoarthritis, right shoulder: Secondary | ICD-10-CM | POA: Diagnosis not present
